# Patient Record
Sex: Female | Born: 1955 | Race: White | Hispanic: No | Smoking: Never smoker
Health system: Southern US, Community
[De-identification: ages and names within clinical notes are randomized; demographics above are authoritative.]

---

## 2020-01-14 ENCOUNTER — Inpatient Hospital Stay
Admission: EM | Admit: 2020-01-14 | Discharge: 2020-01-19 | DRG: 064 | Disposition: A | Discharge status: Rehabilitation Facility | Payer: Medicare (Managed Care) | Attending: Internal Medicine | Admitting: Internal Medicine

## 2020-01-14 ENCOUNTER — Emergency Department: Payer: Medicare (Managed Care)

## 2020-01-14 DIAGNOSIS — Z95828 Presence of other vascular implants and grafts: Secondary | ICD-10-CM

## 2020-01-14 DIAGNOSIS — Z7901 Long term (current) use of anticoagulants: Secondary | ICD-10-CM

## 2020-01-14 DIAGNOSIS — Z823 Family history of stroke: Secondary | ICD-10-CM

## 2020-01-14 DIAGNOSIS — R471 Dysarthria and anarthria: Secondary | ICD-10-CM | POA: Diagnosis present

## 2020-01-14 DIAGNOSIS — G459 Transient cerebral ischemic attack, unspecified: Secondary | ICD-10-CM

## 2020-01-14 DIAGNOSIS — F329 Major depressive disorder, single episode, unspecified: Secondary | ICD-10-CM | POA: Diagnosis present

## 2020-01-14 DIAGNOSIS — E876 Hypokalemia: Secondary | ICD-10-CM | POA: Diagnosis present

## 2020-01-14 DIAGNOSIS — R4701 Aphasia: Secondary | ICD-10-CM | POA: Diagnosis present

## 2020-01-14 DIAGNOSIS — Z86718 Personal history of other venous thrombosis and embolism: Secondary | ICD-10-CM

## 2020-01-14 DIAGNOSIS — Z8249 Family history of ischemic heart disease and other diseases of the circulatory system: Secondary | ICD-10-CM

## 2020-01-14 DIAGNOSIS — Z885 Allergy status to narcotic agent status: Secondary | ICD-10-CM

## 2020-01-14 DIAGNOSIS — R29702 NIHSS score 2: Secondary | ICD-10-CM | POA: Diagnosis present

## 2020-01-14 DIAGNOSIS — Z20822 Contact with and (suspected) exposure to covid-19: Secondary | ICD-10-CM | POA: Diagnosis present

## 2020-01-14 DIAGNOSIS — I639 Cerebral infarction, unspecified: Secondary | ICD-10-CM

## 2020-01-14 DIAGNOSIS — I69320 Aphasia following cerebral infarction: Secondary | ICD-10-CM | POA: Diagnosis not present

## 2020-01-14 DIAGNOSIS — I493 Ventricular premature depolarization: Secondary | ICD-10-CM | POA: Diagnosis present

## 2020-01-14 DIAGNOSIS — D649 Anemia, unspecified: Secondary | ICD-10-CM | POA: Diagnosis present

## 2020-01-14 DIAGNOSIS — R197 Diarrhea, unspecified: Secondary | ICD-10-CM | POA: Diagnosis present

## 2020-01-14 DIAGNOSIS — Z833 Family history of diabetes mellitus: Secondary | ICD-10-CM

## 2020-01-14 DIAGNOSIS — Z91041 Radiographic dye allergy status: Secondary | ICD-10-CM

## 2020-01-14 DIAGNOSIS — R4587 Impulsiveness: Secondary | ICD-10-CM | POA: Diagnosis present

## 2020-01-14 DIAGNOSIS — F419 Anxiety disorder, unspecified: Secondary | ICD-10-CM | POA: Diagnosis present

## 2020-01-14 DIAGNOSIS — I4891 Unspecified atrial fibrillation: Secondary | ICD-10-CM

## 2020-01-14 DIAGNOSIS — I6340 Cerebral infarction due to embolism of unspecified cerebral artery: Secondary | ICD-10-CM | POA: Diagnosis not present

## 2020-01-14 DIAGNOSIS — E43 Unspecified severe protein-calorie malnutrition: Secondary | ICD-10-CM | POA: Insufficient documentation

## 2020-01-14 DIAGNOSIS — R4702 Dysphasia: Secondary | ICD-10-CM | POA: Diagnosis present

## 2020-01-14 DIAGNOSIS — I081 Rheumatic disorders of both mitral and tricuspid valves: Secondary | ICD-10-CM | POA: Diagnosis present

## 2020-01-14 DIAGNOSIS — E785 Hyperlipidemia, unspecified: Secondary | ICD-10-CM | POA: Diagnosis present

## 2020-01-14 DIAGNOSIS — J449 Chronic obstructive pulmonary disease, unspecified: Secondary | ICD-10-CM | POA: Diagnosis present

## 2020-01-14 DIAGNOSIS — I482 Chronic atrial fibrillation, unspecified: Secondary | ICD-10-CM | POA: Diagnosis present

## 2020-01-14 DIAGNOSIS — Z981 Arthrodesis status: Secondary | ICD-10-CM

## 2020-01-14 DIAGNOSIS — E669 Obesity, unspecified: Secondary | ICD-10-CM | POA: Diagnosis present

## 2020-01-14 DIAGNOSIS — D689 Coagulation defect, unspecified: Secondary | ICD-10-CM | POA: Diagnosis present

## 2020-01-14 DIAGNOSIS — Z9049 Acquired absence of other specified parts of digestive tract: Secondary | ICD-10-CM

## 2020-01-14 DIAGNOSIS — Z79899 Other long term (current) drug therapy: Secondary | ICD-10-CM

## 2020-01-14 DIAGNOSIS — F17210 Nicotine dependence, cigarettes, uncomplicated: Secondary | ICD-10-CM | POA: Diagnosis present

## 2020-01-14 DIAGNOSIS — R29810 Facial weakness: Secondary | ICD-10-CM | POA: Diagnosis present

## 2020-01-14 DIAGNOSIS — I6522 Occlusion and stenosis of left carotid artery: Secondary | ICD-10-CM | POA: Diagnosis present

## 2020-01-14 DIAGNOSIS — E0590 Thyrotoxicosis, unspecified without thyrotoxic crisis or storm: Secondary | ICD-10-CM | POA: Diagnosis present

## 2020-01-14 DIAGNOSIS — R1310 Dysphagia, unspecified: Secondary | ICD-10-CM | POA: Diagnosis present

## 2020-01-14 LAB — GLUCOSE, CAPILLARY: Glucose-Capillary: 76 mg/dL (ref 70–99)

## 2020-01-14 LAB — DIFFERENTIAL
Abs Immature Granulocytes: 0.04 10*3/uL (ref 0.00–0.07)
Basophils Absolute: 0 10*3/uL (ref 0.0–0.1)
Basophils Relative: 0 %
Eosinophils Absolute: 0.1 10*3/uL (ref 0.0–0.5)
Eosinophils Relative: 2 %
Immature Granulocytes: 0 %
Lymphocytes Relative: 11 %
Lymphs Abs: 1 10*3/uL (ref 0.7–4.0)
Monocytes Absolute: 0.6 10*3/uL (ref 0.1–1.0)
Monocytes Relative: 7 %
Neutro Abs: 7.5 10*3/uL (ref 1.7–7.7)
Neutrophils Relative %: 80 %

## 2020-01-14 LAB — CBC
HCT: 35.6 % — ABNORMAL LOW (ref 36.0–46.0)
Hemoglobin: 11.4 g/dL — ABNORMAL LOW (ref 12.0–15.0)
MCH: 24.9 pg — ABNORMAL LOW (ref 26.0–34.0)
MCHC: 32 g/dL (ref 30.0–36.0)
MCV: 77.7 fL — ABNORMAL LOW (ref 80.0–100.0)
Platelets: 231 10*3/uL (ref 150–400)
RBC: 4.58 MIL/uL (ref 3.87–5.11)
RDW: 16.5 % — ABNORMAL HIGH (ref 11.5–15.5)
WBC: 9.3 10*3/uL (ref 4.0–10.5)
nRBC: 0 % (ref 0.0–0.2)
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
## 2020-01-14 NOTE — ED Triage Notes (Addendum)
Pt to ED via EMS for aphasia. Hx of ischemic strokes, slight residual weakness per EMS stated by family. Pt alert but lethargic. Unable to identify name upon arrival, p 65m pt able to identify name. Disoriented to situation, trying to place clothes back on. Provider assessed at bedside. Breathing regular and unlabored. LKN at 2210

## 2020-01-15 ENCOUNTER — Inpatient Hospital Stay (HOSPITAL_COMMUNITY)
Admit: 2020-01-15 | Discharge: 2020-01-15 | Disposition: A | Discharge status: Home / Self Care | Payer: Medicare (Managed Care) | Attending: Family Medicine | Admitting: Family Medicine

## 2020-01-15 ENCOUNTER — Other Ambulatory Visit: Payer: Self-pay

## 2020-01-15 ENCOUNTER — Observation Stay: Payer: Medicare (Managed Care)

## 2020-01-15 DIAGNOSIS — I69320 Aphasia following cerebral infarction: Secondary | ICD-10-CM | POA: Diagnosis present

## 2020-01-15 DIAGNOSIS — E876 Hypokalemia: Secondary | ICD-10-CM | POA: Diagnosis present

## 2020-01-15 DIAGNOSIS — I6340 Cerebral infarction due to embolism of unspecified cerebral artery: Secondary | ICD-10-CM | POA: Diagnosis present

## 2020-01-15 DIAGNOSIS — E785 Hyperlipidemia, unspecified: Secondary | ICD-10-CM

## 2020-01-15 DIAGNOSIS — I4819 Other persistent atrial fibrillation: Secondary | ICD-10-CM | POA: Diagnosis not present

## 2020-01-15 DIAGNOSIS — I4891 Unspecified atrial fibrillation: Secondary | ICD-10-CM | POA: Diagnosis not present

## 2020-01-15 DIAGNOSIS — F329 Major depressive disorder, single episode, unspecified: Secondary | ICD-10-CM | POA: Diagnosis present

## 2020-01-15 DIAGNOSIS — J449 Chronic obstructive pulmonary disease, unspecified: Secondary | ICD-10-CM | POA: Diagnosis present

## 2020-01-15 DIAGNOSIS — Z20822 Contact with and (suspected) exposure to covid-19: Secondary | ICD-10-CM | POA: Diagnosis present

## 2020-01-15 DIAGNOSIS — Z9049 Acquired absence of other specified parts of digestive tract: Secondary | ICD-10-CM | POA: Diagnosis not present

## 2020-01-15 DIAGNOSIS — I340 Nonrheumatic mitral (valve) insufficiency: Secondary | ICD-10-CM | POA: Diagnosis not present

## 2020-01-15 DIAGNOSIS — I639 Cerebral infarction, unspecified: Secondary | ICD-10-CM | POA: Diagnosis not present

## 2020-01-15 DIAGNOSIS — Z981 Arthrodesis status: Secondary | ICD-10-CM | POA: Diagnosis not present

## 2020-01-15 DIAGNOSIS — R4587 Impulsiveness: Secondary | ICD-10-CM | POA: Diagnosis present

## 2020-01-15 DIAGNOSIS — R29810 Facial weakness: Secondary | ICD-10-CM | POA: Diagnosis present

## 2020-01-15 DIAGNOSIS — D7218 Eosinophilia in diseases classified elsewhere: Secondary | ICD-10-CM | POA: Diagnosis not present

## 2020-01-15 DIAGNOSIS — I493 Ventricular premature depolarization: Secondary | ICD-10-CM | POA: Diagnosis present

## 2020-01-15 DIAGNOSIS — G894 Chronic pain syndrome: Secondary | ICD-10-CM | POA: Diagnosis not present

## 2020-01-15 DIAGNOSIS — I361 Nonrheumatic tricuspid (valve) insufficiency: Secondary | ICD-10-CM

## 2020-01-15 DIAGNOSIS — E0590 Thyrotoxicosis, unspecified without thyrotoxic crisis or storm: Secondary | ICD-10-CM | POA: Diagnosis present

## 2020-01-15 DIAGNOSIS — D72823 Leukemoid reaction: Secondary | ICD-10-CM | POA: Diagnosis not present

## 2020-01-15 DIAGNOSIS — R4702 Dysphasia: Secondary | ICD-10-CM | POA: Diagnosis present

## 2020-01-15 DIAGNOSIS — D649 Anemia, unspecified: Secondary | ICD-10-CM | POA: Diagnosis present

## 2020-01-15 DIAGNOSIS — I482 Chronic atrial fibrillation, unspecified: Secondary | ICD-10-CM | POA: Diagnosis present

## 2020-01-15 DIAGNOSIS — R1310 Dysphagia, unspecified: Secondary | ICD-10-CM | POA: Diagnosis present

## 2020-01-15 DIAGNOSIS — D689 Coagulation defect, unspecified: Secondary | ICD-10-CM | POA: Diagnosis present

## 2020-01-15 DIAGNOSIS — F419 Anxiety disorder, unspecified: Secondary | ICD-10-CM | POA: Diagnosis present

## 2020-01-15 DIAGNOSIS — D72829 Elevated white blood cell count, unspecified: Secondary | ICD-10-CM | POA: Diagnosis not present

## 2020-01-15 DIAGNOSIS — E43 Unspecified severe protein-calorie malnutrition: Secondary | ICD-10-CM | POA: Diagnosis present

## 2020-01-15 DIAGNOSIS — R4701 Aphasia: Secondary | ICD-10-CM | POA: Diagnosis not present

## 2020-01-15 DIAGNOSIS — R197 Diarrhea, unspecified: Secondary | ICD-10-CM | POA: Diagnosis present

## 2020-01-15 DIAGNOSIS — Z86718 Personal history of other venous thrombosis and embolism: Secondary | ICD-10-CM | POA: Diagnosis not present

## 2020-01-15 DIAGNOSIS — E669 Obesity, unspecified: Secondary | ICD-10-CM | POA: Diagnosis present

## 2020-01-15 DIAGNOSIS — Z7901 Long term (current) use of anticoagulants: Secondary | ICD-10-CM | POA: Diagnosis not present

## 2020-01-15 DIAGNOSIS — R1114 Bilious vomiting: Secondary | ICD-10-CM | POA: Diagnosis not present

## 2020-01-15 LAB — CBC
HCT: 34.3 % — ABNORMAL LOW (ref 36.0–46.0)
Hemoglobin: 10.9 g/dL — ABNORMAL LOW (ref 12.0–15.0)
MCH: 24.7 pg — ABNORMAL LOW (ref 26.0–34.0)
MCHC: 31.8 g/dL (ref 30.0–36.0)
MCV: 77.6 fL — ABNORMAL LOW (ref 80.0–100.0)
Platelets: 206 10*3/uL (ref 150–400)
RBC: 4.42 MIL/uL (ref 3.87–5.11)
RDW: 16.5 % — ABNORMAL HIGH (ref 11.5–15.5)
WBC: 9.5 10*3/uL (ref 4.0–10.5)
nRBC: 0 % (ref 0.0–0.2)

## 2020-01-15 LAB — COMPREHENSIVE METABOLIC PANEL
ALT: 13 U/L (ref 0–44)
ALT: 11 U/L (ref 0–44)
AST: 17 U/L (ref 15–41)
AST: 14 U/L — ABNORMAL LOW (ref 15–41)
Albumin: 3.8 g/dL (ref 3.5–5.0)
Albumin: 3.3 g/dL — ABNORMAL LOW (ref 3.5–5.0)
Alkaline Phosphatase: 65 U/L (ref 38–126)
Alkaline Phosphatase: 58 U/L (ref 38–126)
Anion gap: 11 (ref 5–15)
Anion gap: 10 (ref 5–15)
BUN: 17 mg/dL (ref 8–23)
BUN: 15 mg/dL (ref 8–23)
CO2: 28 mmol/L (ref 22–32)
CO2: 26 mmol/L (ref 22–32)
Calcium: 9.1 mg/dL (ref 8.9–10.3)
Calcium: 8.5 mg/dL — ABNORMAL LOW (ref 8.9–10.3)
Chloride: 103 mmol/L (ref 98–111)
Chloride: 106 mmol/L (ref 98–111)
Creatinine, Ser: 0.88 mg/dL (ref 0.44–1.00)
Creatinine, Ser: 0.64 mg/dL (ref 0.44–1.00)
GFR calc Af Amer: >60 mL/min (ref >60)
GFR calc Af Amer: >60 mL/min (ref >60)
GFR calc non Af Amer: >60 mL/min (ref >60)
GFR calc non Af Amer: >60 mL/min (ref >60)
Glucose, Bld: 99 mg/dL (ref 70–99)
Glucose, Bld: 95 mg/dL (ref 70–99)
Potassium: 3.4 mmol/L — ABNORMAL LOW (ref 3.5–5.1)
Potassium: 3.4 mmol/L — ABNORMAL LOW (ref 3.5–5.1)
Sodium: 142 mmol/L (ref 135–145)
Sodium: 142 mmol/L (ref 135–145)
Total Bilirubin: 1 mg/dL (ref 0.3–1.2)
Total Bilirubin: 0.9 mg/dL (ref 0.3–1.2)
Total Protein: 6.7 g/dL (ref 6.5–8.1)
Total Protein: 6.1 g/dL — ABNORMAL LOW (ref 6.5–8.1)

## 2020-01-15 LAB — LIPID PANEL
Cholesterol: 117 mg/dL (ref 0–200)
HDL: 37 mg/dL — ABNORMAL LOW (ref >40)
LDL Cholesterol: 71 mg/dL (ref 0–99)
Total CHOL/HDL Ratio: 3.2 RATIO
Triglycerides: 47 mg/dL (ref <150)
VLDL: 9 mg/dL (ref 0–40)

## 2020-01-15 LAB — ETHANOL: Alcohol, Ethyl (B): <10 mg/dL (ref <10)

## 2020-01-15 LAB — SARS CORONAVIRUS 2 BY RT PCR (HOSPITAL ORDER, PERFORMED IN ~~LOC~~ HOSPITAL LAB): SARS Coronavirus 2: NEGATIVE

## 2020-01-15 LAB — PROTIME-INR
INR: 1.4 — ABNORMAL HIGH (ref 0.8–1.2)
INR: 2.1 — ABNORMAL HIGH (ref 0.8–1.2)
Prothrombin Time: 16.6 seconds — ABNORMAL HIGH (ref 11.4–15.2)
Prothrombin Time: 22.6 seconds — ABNORMAL HIGH (ref 11.4–15.2)

## 2020-01-15 LAB — HEMOGLOBIN A1C
Hgb A1c MFr Bld: 5.8 % — ABNORMAL HIGH (ref 4.8–5.6)
Mean Plasma Glucose: 119.76 mg/dL

## 2020-01-15 LAB — HIV ANTIBODY (ROUTINE TESTING W REFLEX): HIV Screen 4th Generation wRfx: NONREACTIVE

## 2020-01-15 LAB — APTT: aPTT: 31 seconds (ref 24–36)

## 2020-01-15 MED ORDER — DILTIAZEM HCL 25 MG/5ML IV SOLN
5.0000 mg | Freq: Once | INTRAVENOUS | Status: AC
  Administered 2020-01-15: 5 mg via INTRAVENOUS
  Filled 2020-01-15: qty 5

## 2020-01-15 MED ORDER — ACETAMINOPHEN 650 MG RE SUPP: 650.0000 mg | RECTAL | Status: DC | PRN

## 2020-01-15 MED ORDER — ATORVASTATIN CALCIUM 20 MG PO TABS
40.0000 mg | ORAL_TABLET | Freq: Every day | ORAL | Status: DC
  Administered 2020-01-16 – 2020-01-19 (×4): 40 mg via ORAL
  Filled 2020-01-15 (×4): qty 2

## 2020-01-15 MED ORDER — POTASSIUM CHLORIDE CRYS ER 20 MEQ PO TBCR
40.0000 meq | EXTENDED_RELEASE_TABLET | Freq: Once | ORAL | Status: AC
  Administered 2020-01-15: 40 meq via ORAL
  Filled 2020-01-15: qty 2

## 2020-01-15 MED ORDER — METOPROLOL SUCCINATE ER 50 MG PO TB24
50.0000 mg | ORAL_TABLET | Freq: Every day | ORAL | Status: DC
  Administered 2020-01-15: 50 mg via ORAL
  Filled 2020-01-15: qty 1

## 2020-01-15 MED ORDER — SENNOSIDES-DOCUSATE SODIUM 8.6-50 MG PO TABS: 1.0000 | ORAL_TABLET | Freq: Every evening | ORAL | Status: DC | PRN

## 2020-01-15 MED ORDER — LOPERAMIDE HCL 2 MG PO CAPS
2.0000 mg | ORAL_CAPSULE | ORAL | Status: DC | PRN
  Administered 2020-01-16 – 2020-01-17 (×2): 2 mg via ORAL
  Filled 2020-01-15 (×3): qty 1

## 2020-01-15 MED ORDER — STROKE: EARLY STAGES OF RECOVERY BOOK: Freq: Once | Status: DC

## 2020-01-15 MED ORDER — ALPRAZOLAM 0.25 MG PO TABS
0.2500 mg | ORAL_TABLET | Freq: Two times a day (BID) | ORAL | Status: DC | PRN
  Administered 2020-01-16 – 2020-01-18 (×3): 0.25 mg via ORAL
  Filled 2020-01-15 (×3): qty 1

## 2020-01-15 MED ORDER — MAGNESIUM SULFATE 2 GM/50ML IV SOLN
2.0000 g | Freq: Once | INTRAVENOUS | Status: AC
  Administered 2020-01-15: 2 g via INTRAVENOUS
  Filled 2020-01-15: qty 50

## 2020-01-15 MED ORDER — ACETAMINOPHEN 160 MG/5ML PO SOLN
650.0000 mg | ORAL | Status: DC | PRN
  Filled 2020-01-15: qty 20.3

## 2020-01-15 MED ORDER — OXYCODONE HCL ER 15 MG PO T12A: 40.0000 mg | EXTENDED_RELEASE_TABLET | Freq: Three times a day (TID) | ORAL | Status: DC | PRN

## 2020-01-15 MED ORDER — DILTIAZEM HCL ER COATED BEADS 180 MG PO CP24
180.0000 mg | ORAL_CAPSULE | Freq: Once | ORAL | Status: AC
  Administered 2020-01-15: 180 mg via ORAL
  Filled 2020-01-15: qty 1

## 2020-01-15 MED ORDER — METOPROLOL SUCCINATE ER 50 MG PO TB24
100.0000 mg | ORAL_TABLET | Freq: Every day | ORAL | Status: DC
  Administered 2020-01-16 – 2020-01-19 (×4): 100 mg via ORAL
  Filled 2020-01-15: qty 1
  Filled 2020-01-15 (×2): qty 2
  Filled 2020-01-15: qty 1

## 2020-01-15 MED ORDER — METOPROLOL TARTRATE 5 MG/5ML IV SOLN: 5.0000 mg | Freq: Four times a day (QID) | INTRAVENOUS | Status: DC | PRN

## 2020-01-15 MED ORDER — APIXABAN 5 MG PO TABS
5.0000 mg | ORAL_TABLET | Freq: Two times a day (BID) | ORAL | Status: DC
  Administered 2020-01-15 – 2020-01-17 (×5): 5 mg via ORAL
  Filled 2020-01-15 (×5): qty 1

## 2020-01-15 MED ORDER — SODIUM CHLORIDE 0.9 % IV SOLN: INTRAVENOUS | Status: DC

## 2020-01-15 MED ORDER — RIVAROXABAN 20 MG PO TABS
20.0000 mg | ORAL_TABLET | Freq: Once | ORAL | Status: AC
  Administered 2020-01-15: 20 mg via ORAL
  Filled 2020-01-15: qty 1

## 2020-01-15 MED ORDER — SODIUM CHLORIDE 0.9% FLUSH
10.0000 mL | Freq: Two times a day (BID) | INTRAVENOUS | Status: DC
  Administered 2020-01-15 – 2020-01-19 (×8): 10 mL via INTRAVENOUS

## 2020-01-15 MED ORDER — ASPIRIN EC 81 MG PO TBEC
81.0000 mg | DELAYED_RELEASE_TABLET | Freq: Every day | ORAL | Status: DC
  Administered 2020-01-15 – 2020-01-16 (×2): 81 mg via ORAL
  Filled 2020-01-15 (×2): qty 1

## 2020-01-15 MED ORDER — ACETAMINOPHEN 325 MG PO TABS: 650.0000 mg | ORAL_TABLET | ORAL | Status: DC | PRN

## 2020-01-15 MED ORDER — METOPROLOL TARTRATE 5 MG/5ML IV SOLN
5.0000 mg | Freq: Once | INTRAVENOUS | Status: AC
  Administered 2020-01-15: 5 mg via INTRAVENOUS
  Filled 2020-01-15: qty 5

## 2020-01-15 MED ORDER — DILTIAZEM HCL ER COATED BEADS 180 MG PO CP24
180.0000 mg | ORAL_CAPSULE | Freq: Every day | ORAL | Status: DC
  Administered 2020-01-15 – 2020-01-16 (×2): 180 mg via ORAL
  Filled 2020-01-15 (×2): qty 1

## 2020-01-15 MED ORDER — ATORVASTATIN CALCIUM 20 MG PO TABS
20.0000 mg | ORAL_TABLET | Freq: Every day | ORAL | Status: DC
  Administered 2020-01-15: 20 mg via ORAL
  Filled 2020-01-15 (×2): qty 1
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
## 2020-01-15 NOTE — Progress Notes (Signed)
*  PRELIMINARY RESULTS* Echocardiogram 2D Echocardiogram has been performed.  Julie Harmon 01/15/2020, 6:50 PM

## 2020-01-15 NOTE — ED Notes (Signed)
Report to jennifer, rn.  
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
## 2020-01-15 NOTE — ED Notes (Addendum)
Pt at MRI, per daughter the pt's neurologist and cardiologist reported xarelto is ineffective for pt and should not be taken. Dr. Don Perking made aware via secure private chat. Pt speech has improved but remains slurred. Able to state name but not birthday. Oriented to place, disoriented to time and situation.

## 2020-01-15 NOTE — ED Notes (Signed)
Pt cleansed of small amount of semi formed stool. Pt states in broken sentence after some time "I want to go home" and "what day is it".

## 2020-01-15 NOTE — ED Notes (Signed)
Spoke with Webb Silversmith, np via secure chat regarding results of MRI and pt's cardiac rhythm a fib with rate.

## 2020-01-15 NOTE — Consult Note (Signed)
[facility] TeleNeurology Consult Services   Date of Service:   01/14/2020 23:26:30  Impression:     .  R47.81 - Slurred speech  Comments/Sign-Out: Pt with a h/o prior stroke and intermittent naming issues presents with an episode nausea/vomiting and slurred speech. No aphasia, mild dysarthria and mild confusion on examination. On xarelto reportedly for afib. PT = 16.6 therefore not alteplase candidate. Recommend medical workup for infectious/toxic/metabolic issue, continue xarelto and obtain MRI brain, MRA head and neck to evaluate for acute infarct.  Metrics: Last Known Well: 01/13/2020 22:10:00 [facility] Notification Time: 01/14/2020 23:25:57 Arrival Time: 01/14/2020 23:17:00 Stamp Time: 01/14/2020 23:26:30 Time First Login Attempt: 01/14/2020 23:28:13 Symptoms: speech changes. NIHSS Start Assessment Time: 01/14/2020 23:45:47 Patient is not a candidate for Thrombolytic. Thrombolytic Medical Decision: 01/14/2020 23:42:49 Patient was not deemed candidate for Thrombolytic because of following reasons: Use of NOAs within 48 hours. Coagulopathy.  CT head showed no acute hemorrhage or acute core infarct. CT head was reviewed.  ED Physician notified of diagnostic impression and management plan on 01/15/2020 00:00:35  Advanced Imaging: Advanced Imaging Not Recommended because:  Clinical Presentation is not Suggestive of LVO and NIHSS is <6   Our recommendations are outlined below.  Recommendations:     .  Activate Stroke Protocol Admission/Order Set     .  Stroke/Telemetry Floor     .  Neuro Checks     .  Bedside Swallow Eval     .  DVT Prophylaxis     .  IV Fluids, Normal Saline     .  Head of Bed 30 Degrees     .  Euglycemia and Avoid Hyperthermia (PRN Acetaminophen)     .  Recommend medical workup for infectious/toxic/metabolic issue, continue xarelto and obtain MRI brain, MRA head and neck to evaluate for acute infarct.  Routine Consultation  with Inhouse Neurology for Follow up Care  Sign Out:     .  Discussed with Emergency Department Provider    ------------------------------------------------------------------------------  History of Present Illness: Patient is a 64 year old Female.  Patient was brought by EMS for symptoms of speech changes.  The patient has a prior history of stroke and is visiting family from out of town. She was noted to have an episode of nausea, vomiting, diarrhea at 1010 p.m. She has a h/o intermittent difficulty with naming items since her prior stroke/ speech changes. She is on xarelto for afib (though reported she may have not taken today, unclear when she last took it.   Past Medical History:     . Stroke  Anticoagulant use:  xarelto     Examination: BP(129/55), NGEXB(284), Blood Glucose(76) 1A: Level of Consciousness - Alert; keenly responsive + 0 1B: Ask Month and Age - 1 Question Right + 1 1C: Blink Eyes & Squeeze Hands - Performs Both Tasks + 0 2: Test Horizontal Extraocular Movements - Normal + 0 3: Test Visual Fields - No Visual Loss + 0 4: Test Facial Palsy (Use Grimace if Obtunded) - Normal symmetry + 0 5A: Test Left Arm Motor Drift - No Drift for 10 Seconds + 0 5B: Test Right Arm Motor Drift - No Drift for 10 Seconds + 0 6A: Test Left Leg Motor Drift - No Drift for 5 Seconds + 0 6B: Test Right Leg Motor Drift - No Drift for 5 Seconds + 0 7: Test Limb Ataxia (FNF/Heel-Shin) - No Ataxia + 0 8: Test Sensation - Normal; No sensory loss + 0 9: Test  Language/Aphasia - Normal; No aphasia + 0 10: Test Dysarthria - Mild-Moderate Dysarthria: Slurring but can be understood + 1 11: Test Extinction/Inattention - No abnormality + 0  NIHSS Score: 2  Pre-Morbid Modified Rankin Scale: 2 Points = Slight disability; unable to carry out all previous activities, but able to look after own affairs without assistance   Patient/Family was informed the Neurology Consult would occur via  TeleHealth consult by way of interactive audio and video telecommunications and consented to receiving care in this manner.   Patient is being evaluated for possible acute neurologic impairment and high probability of imminent or life-threatening deterioration. I spent total of 23 minutes providing care to this patient, including time for face to face visit via telemedicine, review of medical records, imaging studies and discussion of findings with providers, the patient and/or family.   Dr Rutherford Guys   [facility] [phone]  Case [id number]

## 2020-01-15 NOTE — Progress Notes (Signed)
Patient and family requesting that her [place] of [place] Neurologist be contacted Dr. [place] Livings number [phone] if additional info required. I am not able to verify this information therefore cannot call the listed Physician to discuss patient's pertinent health information.    Webb Silversmith, BSN, MSN, DNP, [employer]  [facility] Nurse Practitioner  [facility]

## 2020-01-15 NOTE — Progress Notes (Signed)
Chaplain responded to a CODE STROKE page. Chaplain consulted with MD and RN.   Chaplain offered emotional-spiritual care to Patient and Sister. Patient declined at the time of the visit.  Chaplain will remain available by way of page and spiritual consult.  Clenton Pare, MDiv Staff Chaplain-Relief

## 2020-01-15 NOTE — ED Notes (Signed)
Hx of a-fib. Tele-stroke used, admitting physician at bedside
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
## 2020-01-15 NOTE — ED Notes (Signed)
Sister at bedside, states last stroke in June 2021 with clot removal. Pt had episode of NVD at 2210 and aphasia started. Pt and sister state pt was not feeling well all day. Recently drove to [facility] from [place].

## 2020-01-15 NOTE — ED Notes (Signed)
Per melanie, ed tech, pt's sister has called and asked that pt's personal MD be consulted regarding pt's care at primary MD request. Phone number and name provided to [facility], np.

## 2020-01-15 NOTE — Plan of Care (Signed)
  Problem: Education: Goal: Knowledge of disease or condition will improve Outcome: Progressing Goal: Knowledge of secondary prevention will improve Outcome: Progressing Goal: Knowledge of patient specific risk factors addressed and post discharge goals established will improve Outcome: Progressing   Problem: Education: Goal: Knowledge of General Education information will improve Description: Including pain rating scale, medication(s)/side effects and non-pharmacologic comfort measures Outcome: Progressing   Problem: Health Behavior/Discharge Planning: Goal: Ability to manage health-related needs will improve Outcome: Progressing

## 2020-01-15 NOTE — ED Notes (Signed)
Pt does have a delay in following commands but is able to do so appropriately if given extended time to complete command. No diarrhea noted at this time.

## 2020-01-15 NOTE — ED Notes (Signed)
Report given to April, RN. Sister remains at bedside
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
## 2020-01-15 NOTE — ED Notes (Signed)
Posey alarm in place. Fall pads around bed.

## 2020-01-15 NOTE — ED Notes (Signed)
Pt transported to CT on monitor by RN, tolerated well.

## 2020-01-16 ENCOUNTER — Inpatient Hospital Stay: Payer: Medicare (Managed Care)

## 2020-01-16 DIAGNOSIS — R4701 Aphasia: Secondary | ICD-10-CM

## 2020-01-16 LAB — COMPREHENSIVE METABOLIC PANEL
ALT: 12 U/L (ref 0–44)
AST: 18 U/L (ref 15–41)
Albumin: 3.7 g/dL (ref 3.5–5.0)
Alkaline Phosphatase: 61 U/L (ref 38–126)
Anion gap: 14 (ref 5–15)
BUN: 12 mg/dL (ref 8–23)
CO2: 22 mmol/L (ref 22–32)
Calcium: 8.9 mg/dL (ref 8.9–10.3)
Chloride: 103 mmol/L (ref 98–111)
Creatinine, Ser: 0.53 mg/dL (ref 0.44–1.00)
GFR calc Af Amer: >60 mL/min (ref >60)
GFR calc non Af Amer: >60 mL/min (ref >60)
Glucose, Bld: 96 mg/dL (ref 70–99)
Potassium: 3.7 mmol/L (ref 3.5–5.1)
Sodium: 139 mmol/L (ref 135–145)
Total Bilirubin: 1.2 mg/dL (ref 0.3–1.2)
Total Protein: 6.7 g/dL (ref 6.5–8.1)

## 2020-01-16 LAB — CBC
HCT: 38 % (ref 36.0–46.0)
Hemoglobin: 12.3 g/dL (ref 12.0–15.0)
MCH: 25.1 pg — ABNORMAL LOW (ref 26.0–34.0)
MCHC: 32.4 g/dL (ref 30.0–36.0)
MCV: 77.4 fL — ABNORMAL LOW (ref 80.0–100.0)
Platelets: 235 10*3/uL (ref 150–400)
RBC: 4.91 MIL/uL (ref 3.87–5.11)
RDW: 17.1 % — ABNORMAL HIGH (ref 11.5–15.5)
WBC: 12.9 10*3/uL — ABNORMAL HIGH (ref 4.0–10.5)
nRBC: 0 % (ref 0.0–0.2)

## 2020-01-16 LAB — ECHOCARDIOGRAM COMPLETE
AR max vel: 1.34 cm2
AV Peak grad: 7.6 mmHg
Ao pk vel: 1.38 m/s
Area-P 1/2: 3.23 cm2
S' Lateral: 2.84 cm
Weight: 1992 oz

## 2020-01-16 LAB — PROTIME-INR
INR: 1.8 — ABNORMAL HIGH (ref 0.8–1.2)
Prothrombin Time: 20 seconds — ABNORMAL HIGH (ref 11.4–15.2)

## 2020-01-16 MED ORDER — DILTIAZEM HCL ER COATED BEADS 300 MG PO CP24
300.0000 mg | ORAL_CAPSULE | Freq: Every day | ORAL | Status: DC
  Administered 2020-01-17 – 2020-01-19 (×3): 300 mg via ORAL
  Filled 2020-01-16 (×3): qty 1

## 2020-01-16 MED ORDER — ACETAMINOPHEN 325 MG PO TABS
650.0000 mg | ORAL_TABLET | ORAL | Status: DC | PRN
  Administered 2020-01-19: 650 mg via ORAL
  Filled 2020-01-16: qty 2

## 2020-01-16 MED ORDER — DILTIAZEM HCL 30 MG PO TABS
60.0000 mg | ORAL_TABLET | Freq: Once | ORAL | Status: AC
  Administered 2020-01-16: 60 mg via ORAL
  Filled 2020-01-16: qty 2

## 2020-01-16 MED ORDER — ACETAMINOPHEN 650 MG RE SUPP: 650.0000 mg | RECTAL | Status: DC | PRN

## 2020-01-16 MED ORDER — ACETAMINOPHEN 160 MG/5ML PO SOLN
650.0000 mg | ORAL | Status: DC | PRN
  Filled 2020-01-16: qty 20.3
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
## 2020-01-16 NOTE — TOC Progression Note (Signed)
Transition of Care [facility]) - Progression Note    Patient Details  Name: Julie Harmon MRN: [id number] Date of Birth: [birth year]  [facility] CM/SW Contact  Shawn Route, RN Phone Number: 01/16/2020, 10:20 AM  Clinical Narrative:    Patient to discharge home with family will need [facility] services when she returns to [place], which is in the next week.  I have attempted to reach primary care physician and left a message, but also instructed patient to contact her PCP when she gets home so this can be set up.         Expected Discharge Plan and Services                                                 Social Determinants of Health (SDOH) Interventions    Readmission Risk Interventions No flowsheet data found.

## 2020-01-16 NOTE — Progress Notes (Signed)
PROGRESS NOTE    Julie Harmon  HWT:[id number] DOB: 08-Jul-1955 DOA: 01/14/2020 PCP: System, Pcp Not In      Brief Narrative:  Julie Harmon is a 64 y.o. F with obesity, Afib on Xarelto, hx CVA, COPD, depression, hyperthyroidism, recurrent DVTs s/p IVC filter who presented with acute aphasia.  Patient is visiting from out of town.  She felt malaise all day, then in the evening had an episode of nausea and vomiting, and afterwards could not speak, brought immediately to the ER.    In the ER, CT head unremarkable.  Evaluated by Neurology and was not candidate for tPA due to being on Xarelto.  Subsequent MRI showed left frontal operculum/insula stroke.         Assessment & Plan:  Acute embolic STROKE Admission MRI showed small LEFT embolic infarct. -Non-invasive angiography showed no atherosclerosis on MRA -Echocardiogram showed EF 50-55% moderate tricuspid regurg -Carotid imaging no stenosis -Lipids ordered: on statin -Aspirin ordered at admission: Transition to Eliquis -Atrial fibrillation: Chronic, on Eliquis -tPA not given because NIH SS only 2, also on blood thinner -Dysphagia screen ordered in ER -PT eval ordered     Chronic atrial fibrillation HR near goal at rest, but still elevated, and up to 150s with exertion despite increasing metoprolol dose.  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    The patient was admitted with stroke.  She is normally independent, able to  care for self, but at present is unstable to stand alone, and severely aphasic, impulsive and unable to care for self.  Will titrate rate control again.  If HR < 110 at rest tomorrow,  will discharge to CIR when bed available.          MDM: The below labs and imaging reports reviewed and summarized above.  Medication management as above.    DVT prophylaxis: SCD's Start: 01/15/20 0048 apixaban (ELIQUIS) tablet 5 mg  Code Status: FULL Family Communication: none today    Consultants:   Neurology         Subjective: No headache.  Aphasia not better.  Very unsteady.  No fever, cough, chest pain.  No dyspnea, palpitations         Objective: Vitals:   01/16/20 0751 01/16/20 1301 01/16/20 1631 01/16/20 1958  BP: (!) 144/82 117/76 120/87 (!) 134/95  Pulse: (!) 119 (!) 116 (!) 103 (!) 123  Resp: 18 19 20 19   Temp: 98.5 F (36.9 C) 97.7 F (36.5 C) 98.5 F (36.9 C)   TempSrc: Oral Oral Oral   SpO2: 95% 99% 99% 97%  Weight:      Height:        Intake/Output Summary (Last 24 hours) at 01/16/2020 2057 Last data filed at 01/16/2020 1850 Gross per 24 hour  Intake 240 ml  Output  1 ml  Net 239 ml   Filed Weights   01/14/20 2334 01/15/20 1732  Weight: 56.5 kg 59.9 kg    Examination: BP (!) 134/95 (BP Location: Left Arm)   Pulse (!) 123   Temp 98.5 F (36.9 C) (Oral)   Resp 19   Ht 5\' 6"  (1.676 m)   Wt 59.9 kg   SpO2 97%   BMI 21.31 kg/m   General: Healthy, alert and in no distress.  Responds slowly and haltingly to some questions.   HEENT: Corneas clear, conjunctivae and sclerae normal without injection or icterus, lids and lashes normal.  Visual tracking smooth.  OP moist without erythema, exudates, cobblestoning, or ulcers.  No airway deformities.  Neck supple.   Cardiac: Tachycarid, irregular, nl S1-S2, no murmurs, rubs, gallops.  Capillary refill is less than 2 seconds.   Respiratory: Normal respiratory rate and rhythm.  CTAB without rales or  wheezes. Abdomen: BS present.  No TTP or rebound all quadrants.  No masses or organomegaly.  No scars.  No striae, dilated veins, rashes, or lesions.  No ascites, distension. Extremities: No deformities/injuries.  5/5 grip strength and upper extremity flexion/extension, symmetrically.  Extremities are warm and well-perfused. Neuro: Sensorium intact.  Speech is halting, very difficult with naming.Recall seems impaired  Muscle tone normal seems diminishedAttention span and concentration are within normal limits.  Psych:     Data Reviewed: I have personally reviewed following labs and imaging studies:  CBC: Recent Labs  Lab 01/14/20 2324 01/15/20 0227 01/16/20 0502  WBC 9.3 9.5 12.9*  NEUTROABS 7.5  --   --   HGB 11.4* 10.9* 12.3  HCT 35.6* 34.3* 38.0  MCV 77.7* 77.6* 77.4*  PLT 231 206 235   Basic Metabolic Panel: Recent Labs  Lab 01/14/20 2324 01/15/20 0227 01/16/20 0502  NA 142 142 139  K 3.4* 3.4* 3.7  CL 103 106 103  CO2 28 26 22   GLUCOSE 99 95 96  BUN 17 15 12   CREATININE 0.88 0.64 0.53  CALCIUM 9.1 8.5* 8.9   GFR: Estimated Creatinine Clearance: 66.5 mL/min (by C-G formula based on SCr of 0.53 mg/dL). Liver Function Tests: Recent Labs  Lab 01/14/20 2324 01/15/20 0227 01/16/20 0502  AST 17 14* 18  ALT 13 11 12   ALKPHOS 65 58 61  BILITOT 1.0 0.9 1.2  PROT 6.7 6.1* 6.7  ALBUMIN 3.8 3.3* 3.7   No results for input(s): LIPASE, AMYLASE in the last 168 hours. No results for input(s): AMMONIA in the last 168 hours. Coagulation Profile: Recent Labs  Lab 01/14/20 2324 01/15/20 1828 01/16/20 0502  INR 1.4* 2.1* 1.8*   Cardiac Enzymes: No results for input(s): CKTOTAL, CKMB, CKMBINDEX, TROPONINI in the last 168 hours. BNP (last 3 results) No results for input(s): PROBNP in the last 8760 hours. HbA1C: Recent Labs    01/15/20 0227  HGBA1C 5.8*   CBG: Recent Labs  Lab 01/14/20 2332  GLUCAP 76   Lipid Profile: Recent Labs    01/15/20 0227  CHOL  117  HDL 37*  LDLCALC 71  TRIG 47  CHOLHDL 3.2   Thyroid Function Tests: No results for input(s): TSH, T4TOTAL, FREET4, T3FREE, THYROIDAB in the last 72 hours. Anemia Panel: No results for input(s): VITAMINB12, FOLATE, FERRITIN, TIBC, IRON, RETICCTPCT in the last 72 hours. Urine analysis: No results found for: COLORURINE, APPEARANCEUR, LABSPEC, PHURINE, GLUCOSEU, HGBUR, BILIRUBINUR, KETONESUR, PROTEINUR, UROBILINOGEN, NITRITE, LEUKOCYTESUR Sepsis Labs: @LABRCNTIP (procalcitonin:4,lacticacidven:4)  ) Recent Results (from the past 240 hour(s))  SARS  Coronavirus 2 by RT PCR (hospital order, performed in [facility] hospital lab) Nasopharyngeal Nasopharyngeal Swab     Status: None   Collection Time: 01/15/20 12:34 AM   Specimen: Nasopharyngeal Swab  Result Value Ref Range Status   SARS Coronavirus 2 NEGATIVE NEGATIVE Final    Comment: (NOTE) SARS-CoV-2 target nucleic acids are NOT DETECTED.  The SARS-CoV-2 RNA is generally detectable in upper and lower respiratory specimens during the acute phase of infection. The lowest concentration of SARS-CoV-2 viral copies this assay can detect is 250 copies / mL. A negative result does not preclude SARS-CoV-2 infection and should not be used as the sole basis for treatment or other patient management decisions.  A negative result may occur with improper specimen collection / handling, submission of specimen other than nasopharyngeal swab, presence of viral mutation(s) within the areas targeted by this assay, and inadequate number of viral copies (<250 copies / mL). A negative result must be combined with clinical observations, patient history, and epidemiological information.  Fact Sheet for Patients:   BoilerBrush.com.cy  Fact Sheet for Healthcare Providers: https://pope.com/  This test is not yet approved or  cleared by the [place] FDA and has been authorized for detection and/or  diagnosis of SARS-CoV-2 by FDA under an Emergency Use Authorization (EUA).  This EUA will remain in effect (meaning this test can be used) for the duration of the COVID-19 declaration under Section 564(b)(1) of the Act, 21 U.S.C. section 360bbb-3(b)(1), unless the authorization is terminated or revoked sooner.  Performed at [facility] [address] [place], [place] [address]          Radiology Studies: MR ANGIO HEAD WO CONTRAST  Result Date: 01/16/2020 CLINICAL DATA:  64 year old female with code stroke presentation on 01/14/2020. MRI revealing small acute infarct of the left operculum, fairly extensive underlying chronic bilateral infarcts. EXAM: MRA HEAD WITHOUT CONTRAST TECHNIQUE: Angiographic images of the Circle of Willis were obtained using MRA technique without intravenous contrast. COMPARISON:  Brain MRI 01/15/2020. FINDINGS: Antegrade flow in the posterior circulation. Only the distal most vertebral arteries are included and appear normal at the vertebrobasilar junction. Patent basilar artery without stenosis. Normal SCA and left PCA origins. Fetal type right PCA origin. Right posterior communicating artery diminutive or absent. Bilateral PCA branches are within normal limits. Antegrade flow in both ICA siphons. Mild ectasia of the right siphon, measuring up to 6-7 mm diameter in the cavernous segment (series 5, image 55). No siphon stenosis, and no siphon saccular aneurysm. Ophthalmic and right posterior communicating artery origins appear normal. Patent carotid termini, MCA and ACA origins. Diminutive or absent anterior communicating artery. Visible ACA branches are within normal limits (allowing for mild motion artifact). Both MCAs bifurcate early, with mild ectasia of the right M1 segment. Visible right MCA branches are within normal limits. No discrete left MCA branch occlusion is identified on the source images. IMPRESSION: 1. Negative for large vessel occlusion. No  discrete Left MCA branch occlusion is identified. 2. No significant intracranial stenosis identified. There is mild dolichoectasia of the Right ICA siphon and the Right M1 segment. Electronically Signed   By: Odessa Fleming M.D.   On: 01/16/2020 12:40   MR BRAIN WO CONTRAST  Result Date: 01/15/2020 CLINICAL DATA:  Expressive aphasia EXAM: MRI HEAD WITHOUT CONTRAST TECHNIQUE: Multiplanar, multiecho pulse sequences of the brain and surrounding structures were obtained without intravenous contrast. COMPARISON:  Head CT 01/14/2020 FINDINGS: Brain: There is an area of faint hyperintensity on diffusion-weighted imaging  within the left frontal operculum and insula. No other diffusion abnormality. There is not a clear ADC correlate. 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 These include both frontal lobes, both parietal lobes, the left occipital lobe in the left cerebellum. Early confluent hyperintense T2-weighted signal of the periventricular and deep white matter. Advanced atrophy for age. No chronic microhemorrhage. Normal midline structures. Vascular: Normal flow voids. Skull and upper cervical spine: Normal marrow signal. Sinuses/Orbits: Negative. Other: None. IMPRESSION: 1. 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 No hemorrhage or mass effect. 2. 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 Electronically Signed   By: Deatra Robinson M.D.   On: 01/15/2020 01:53   US Carotid Bilateral  Result Date: 01/16/2020 CLINICAL DATA:  Acute cerebral infarction. History of hyperlipidemia and smoking. EXAM: BILATERAL CAROTID DUPLEX ULTRASOUND TECHNIQUE: Wallace Cullens scale imaging, color Doppler and duplex ultrasound were performed of bilateral carotid and vertebral arteries in the neck. COMPARISON:  None. FINDINGS: Criteria: Quantification of carotid stenosis is based on velocity parameters that correlate the residual internal carotid diameter with NASCET-based stenosis levels, using the  diameter of the distal internal carotid lumen as the denominator for stenosis measurement. The following velocity measurements were obtained: RIGHT ICA:  56/14 cm/sec CCA:  71/21 cm/sec SYSTOLIC ICA/CCA RATIO:  0.8 ECA:  62 cm/sec LEFT ICA:  45/12 cm/sec CCA:  60/17 cm/sec SYSTOLIC ICA/CCA RATIO:  0.7 ECA:  76 cm/sec RIGHT CAROTID ARTERY: Intimal thickening present without focal plaque or evidence of carotid stenosis. RIGHT VERTEBRAL ARTERY: Antegrade flow with normal waveform and velocity. LEFT CAROTID ARTERY: Minimal plaque at the level of the carotid bulb and proximal left ICA. No significant stenosis identified with estimated left ICA stenosis of less than 50%. LEFT VERTEBRAL ARTERY: Antegrade flow with normal waveform and velocity. IMPRESSION: Minimal plaque at the level of the left carotid bulb and proximal left ICA. Estimated left ICA stenosis is less than 50%. Electronically Signed   By: Irish Lack M.D.   On: 01/16/2020 09:16   ECHOCARDIOGRAM COMPLETE  Result Date: 01/16/2020    ECHOCARDIOGRAM REPORT   Patient Name:   Julie Harmon Date of Exam: 01/15/2020 Medical Rec #:  [id number]     Height: Accession #:    [id number]    Weight:       124.5 lb Date of Birth:  04-20-1956     BSA:          1.472 m Patient Age:    64 years      BP:           132/94 mmHg Patient Gender: F             HR:           72 bpm. Exam Location:  [facility] Procedure: 2D Echo, Cardiac Doppler and Color Doppler Indications:     TIA 435.9 / G45.9  History:         Patient has no prior history of Echocardiogram examinations.                  Stroke; Arrythmias:Atrial Fibrillation.  Sonographer:     Neysa Bonito Roar Referring Phys:  [id number] Vernetta Honey MANSY Diagnosing Phys: Yvonne Kendall MD IMPRESSIONS  1. Left ventricular ejection fraction, by estimation, is 50 to 55%. The left ventricle has low normal function. Left ventricular endocardial border not optimally defined to evaluate regional wall motion. Left ventricular diastolic parameters are  indeterminate.  2. Right ventricular systolic function is low  normal. The right ventricular size is normal. There is moderately elevated pulmonary artery systolic pressure.  3. Left atrial size was mildly dilated.  4. Right atrial size was mildly dilated.  5. The mitral valve is degenerative. Mild to moderate mitral valve regurgitation.  6. Tricuspid valve regurgitation is moderate.  7. Unable to determine valve morphology due to image quality. Aortic valve regurgitation is not visualized. No aortic stenosis is present.  8. The inferior vena cava is normal in size with <50% respiratory variability, suggesting right atrial pressure of 8 mmHg. FINDINGS  Left Ventricle: Left ventricular ejection fraction, by estimation, is 50 to 55%. The left ventricle has low normal function. Left ventricular endocardial border not optimally defined to evaluate regional wall motion. The left ventricular internal cavity  size was normal in size. There is no left ventricular hypertrophy. Left ventricular diastolic parameters are indeterminate. Right Ventricle: The right ventricular size is normal. No increase in right ventricular wall thickness. Right ventricular systolic function is low normal. There is moderately elevated pulmonary artery systolic pressure. The tricuspid regurgitant velocity  is 3.37 m/s, and with an assumed right atrial pressure of 8 mmHg, the estimated right ventricular systolic pressure is 53.4 mmHg. Left Atrium: Left atrial size was mildly dilated. Right Atrium: Right atrial size was mildly dilated. Pericardium: There is no evidence of pericardial effusion. Mitral Valve: The mitral valve is degenerative in appearance. There is mild thickening of the mitral valve leaflet(s). Mild to moderate mitral valve regurgitation. Tricuspid Valve: The tricuspid valve is not well visualized. Tricuspid valve regurgitation is moderate. Aortic Valve: Unable to determine valve morphology due to image quality. Aortic valve  regurgitation is not visualized. No aortic stenosis is present. Aortic valve peak gradient measures 7.6 mmHg. Pulmonic Valve: The pulmonic valve was not well visualized. Pulmonic valve regurgitation is trivial. No evidence of pulmonic stenosis. Aorta: The aortic root is normal in size and structure. Pulmonary Artery: The pulmonary artery is not well seen. Venous: The inferior vena cava is normal in size with less than 50% respiratory variability, suggesting right atrial pressure of 8 mmHg. IAS/Shunts: The interatrial septum was not well visualized.  LEFT VENTRICLE PLAX 2D LVIDd:         3.66 cm  Diastology LVIDs:         2.84 cm  LV e' lateral:   12.30 cm/s LV PW:         0.94 cm  LV E/e' lateral: 10.6 LV IVS:        0.96 cm  LV e' medial:    9.46 cm/s LVOT diam:     1.80 cm  LV E/e' medial:  13.7 LVOT Area:     2.54 cm  RIGHT VENTRICLE RV Mid diam:    2.47 cm RV S prime:     12.70 cm/s LEFT ATRIUM             Index       RIGHT ATRIUM           Index LA diam:        3.90 cm 2.65 cm/m  RA Area:     18.10 cm LA Vol (A2C):   74.3 ml 50.47 ml/m RA Volume:   48.70 ml  33.08 ml/m LA Vol (A4C):   45.2 ml 30.70 ml/m LA Biplane Vol: 62.7 ml 42.59 ml/m  AORTIC VALVE                PULMONIC VALVE AV Area (Vmax): 1.34 cm  PV Vmax:        0.80 m/s AV Vmax:        138.00 cm/s PV Peak grad:   2.6 mmHg AV Peak Grad:   7.6 mmHg    RVOT Peak grad: 1 mmHg LVOT Vmax:      72.50 cm/s  AORTA Ao Root diam: 2.20 cm MITRAL VALVE                TRICUSPID VALVE MV Area (PHT): 3.23 cm     TR Peak grad:   45.4 mmHg MV Decel Time: 235 msec     TR Vmax:        337.00 cm/s MV E velocity: 130.00 cm/s                             SHUNTS                             Systemic Diam: 1.80 cm Yvonne Kendallhristopher End MD Electronically signed by Yvonne Kendallhristopher End MD Signature Date/Time: 01/16/2020/9:15:28 AM    Final    CT HEAD CODE STROKE WO CONTRAST  Result Date: 01/14/2020 CLINICAL DATA:  Code stroke.  Aphasia.  Last seen normal 10:10 p.m. EXAM: CT HEAD  WITHOUT CONTRAST TECHNIQUE: Contiguous axial images were obtained from the base of the skull through the vertex without intravenous contrast. COMPARISON:  None. FINDINGS: Brain: There is no acute hemorrhage or other extra-axial collection. No midline shift or other mass effect. No hydrocephalus. There are old infarcts both frontal lobes, both parietal lobes, left occipital lobe and left cerebellum. There is no acute infarct visible. Vascular: No hyperdense vessel or unexpected calcification. Skull: Normal. Negative for fracture or focal lesion. Sinuses/Orbits: No acute finding. Other: None. ASPECTS [facility]([place] Stroke Program Early CT Score) - Ganglionic level infarction (caudate, lentiform nuclei, internal capsule, insula, M1-M3 cortex): 7 - Supraganglionic infarction (M4-M6 cortex): 3 Total score (0-10 with 10 being normal): 10 IMPRESSION: 1. No acute hemorrhage or mass effect. 2. Multiple old infarcts. 3. ASPECTS is 10. These results were called by telephone at the time of interpretation on 01/14/2020 at 11:49 pm to provider [facility]OL[facility] VERONESE , who verbally acknowledged these results. Electronically Signed   By: Deatra RobinsonKevin  Herman M.D.   On: 01/14/2020 23:49        Scheduled Meds: .  stroke: mapping our early stages of recovery book   Does not apply Once  . apixaban  5 mg Oral BID  . aspirin EC  81 mg Oral Daily  . atorvastatin  40 mg Oral Daily  . diltiazem  180 mg Oral Daily  . metoprolol succinate  100 mg Oral Daily  . sodium chloride flush  10 mL Intravenous Q12H   Continuous Infusions:    LOS: 1 day    Time spent: 25 minutes    Alberteen Samhristopher P Kem Parcher, MD [facility] 01/16/2020, 8:57 PM     Please page though AMION or Epic secure chat:  For password, contact charge nurse

## 2020-01-16 NOTE — Consult Note (Addendum)
Physical Medicine and Rehabilitation Consult Reason for Consult: Impaired mobility and ADLs Referring Physician: Joen Laura, MD   HPI: Julie Harmon is a 64 y.o. female w/ PMH of CVA, AF on Xarelto, COPD, depression, GAD, hyperthryroidism, recurrent DVT s/p IVC filter, who was admitted on [facility] on 01/14/20 with expressive aphasia, emesis, and diarrhea. She was found to have an acute/subacute infarct of the left frontal operculum and insula on MRI. Physical Medicine & Rehabilitation was consulted to assess candidacy for CIR.   Denies constipation, pain. +expressive aphasia Lives with her daughter but she is not home during the day.  +tremor  Review of Systems  Constitutional: Negative for chills and fever.  HENT: Negative for hearing loss and tinnitus.   Eyes: Negative for blurred vision and double vision.  Respiratory: Negative for cough and hemoptysis.   Cardiovascular: Negative for chest pain and palpitations.  Gastrointestinal: Negative for heartburn and nausea.  Genitourinary: Negative for dysuria and urgency.  Musculoskeletal: Negative for myalgias and neck pain.  Skin: Negative for rash.  Neurological: Negative for dizziness and headaches.  Endo/Heme/Allergies: Negative for environmental allergies. Does not bruise/bleed easily.  Psychiatric/Behavioral: Negative for depression and suicidal ideas.   Social History:  has no history on file for tobacco use, alcohol use, and drug use. Allergies:  Allergies  Allergen Reactions  . Iodine Swelling  . Morphine Nausea Only   Medications Prior to Admission  Medication Sig Dispense Refill  . ALPRAZolam (XANAX) 0.25 MG tablet Take 0.25 mg by mouth 2 (two) times daily as needed for anxiety.     Marland Kitchen atorvastatin (LIPITOR) 10 MG tablet Take 10 mg by mouth at bedtime.    Marland Kitchen diltiazem (CARDIZEM CD) 180 MG 24 hr capsule Take 180 mg by mouth daily.    Marland Kitchen ELIQUIS 5 MG TABS tablet Take 5 mg by mouth 2 (two) times daily.    .  furosemide (LASIX) 40 MG tablet Take 40 mg by mouth daily.    . methocarbamol (ROBAXIN) 750 MG tablet Take 750 mg by mouth every 8 (eight) hours as needed for muscle spasms.    . metoprolol succinate (TOPROL-XL) 50 MG 24 hr tablet Take 50 mg by mouth daily.    . ondansetron (ZOFRAN-ODT) 8 MG disintegrating tablet Take 8 mg by mouth every 8 (eight) hours as needed for nausea/vomiting.    . OXYCONTIN 40 MG 12 hr tablet Take 40 mg by mouth every 8 (eight) hours as needed (severe pain).       Home: Home Living Family/patient expects to be discharged to:: Private residence Living Arrangements: Parent Available Help at Discharge: Family Type of Home: House Home Access: Level entry Home Layout: Two level, Able to live on main level with bedroom/bathroom Home Equipment: None Additional Comments: reports at baseline is independent with BADL, cleans houses for work; denies baseline balance or strength issues, but later indicates she may have soem chronic LUE weakness.  Lives With: Family, Daughter  Functional History: Prior Function Level of Independence: Independent Functional Status:  Mobility: Bed Mobility Overal bed mobility: Modified Independent General bed mobility comments: HOB was elevated. increased time to perform. pt in A fib throughout. alot of effort to achieve EOB sitting Transfers Overall transfer level: Needs assistance Equipment used: 1 person hand held assist Transfers: Sit to/from Stand Sit to Stand: Min guard, Min assist, From elevated surface General transfer comment: Pt was able to stand EOB with HHA of 1. She is unsteady upon standing with rapid elevation in  HR. HR between 109-154bpm during session Ambulation/Gait Ambulation/Gait assistance: Min assist, Mod assist Gait Distance (Feet): 25 Feet Assistive device: 1 person hand held assist Gait Pattern/deviations: Shuffle, Staggering left, Staggering right, Drifts right/left, Narrow base of support, Decreased step length  - right, Decreased step length - left General Gait Details: Pt is very unsteady on feet. unable to advabnce distances 2/2 to fall risk. 3 occasions of LOB with therapist intervention during ambulation to doorway to prevent fall. heavy use of gait belt. pt fatigued quickly.  Gait velocity: decreased    ADL:    Cognition: Cognition Overall Cognitive Status: Difficult to assess Arousal/Alertness: Awake/alert Orientation Level: Oriented to person, Disoriented to place, Disoriented to time, Disoriented to situation (possibly related to expressive aphasia) Attention: Selective Selective Attention: Appears intact Memory: Appears intact Awareness: Appears intact Problem Solving: Appears intact Cognition Arousal/Alertness: Awake/alert Behavior During Therapy: WFL for tasks assessed/performed Overall Cognitive Status: Difficult to assess General Comments: difficulty to assess due to aphasia but overall seems to answer appropriately  Blood pressure (!) 144/82, pulse (!) 119, temperature 98.5 F (36.9 C), temperature source Oral, resp. rate 18, height  (1.676 m), weight 59.9 kg, SpO2 95 %. Physical Exam  General: Alert and oriented x 1 (due to aphasia primarily). Able to state that she worked at [employer] for [employer]. No apparent distress HEENT: Head is normocephalic, atraumatic, PERRLA, EOMI, sclera anicteric, oral mucosa pink and moist, dentition intact, ext ear canals clear,  Neck: Supple without JVD or lymphadenopathy Heart: Reg rate and rhythm. No murmurs rubs or gallops Chest: CTA bilaterally without wheezes, rales, or rhonchi; no distress Abdomen: Soft, non-tender, non-distended, bowel sounds positive. Extremities: No clubbing, cyanosis, or edema. Pulses are 2+ Skin: Clean and intact without signs of breakdown Neuro: Expressive>receptive aphasia. Cranial nerves 2-12 are intact except for left facial droop. Sensory exam is normal. Reflexes are 2+ in all 4's. Fine motor coordination is  intact. +full body tremor. Motor function is grossly 5/5 except for LUE 4/5. Impulsive, trying to get out of bed.  Psych: Pt's affect is appropriate. Pt is cooperative  Results for orders placed or performed during the hospital encounter of 01/14/20 (from the past 24 hour(s))  Protime-INR     Status: Abnormal   Collection Time: 01/15/20  6:28 PM  Result Value Ref Range   Prothrombin Time 22.6 (H) 11.4 - 15.2 seconds   INR 2.1 (H) 0.8 - 1.2  CBC     Status: Abnormal   Collection Time: 01/16/20  5:02 AM  Result Value Ref Range   WBC 12.9 (H) 4.0 - 10.5 K/uL   RBC 4.91 3.87 - 5.11 MIL/uL   Hemoglobin 12.3 12.0 - 15.0 g/dL   HCT 16.1 36 - 46 %   MCV 77.4 (L) 80.0 - 100.0 fL   MCH 25.1 (L) 26.0 - 34.0 pg   MCHC 32.4 30.0 - 36.0 g/dL   RDW 09.6 (H) 04.5 - 40.9 %   Platelets 235 150 - 400 K/uL   nRBC 0.0 0.0 - 0.2 %  Comprehensive metabolic panel     Status: None   Collection Time: 01/16/20  5:02 AM  Result Value Ref Range   Sodium 139 135 - 145 mmol/L   Potassium 3.7 3.5 - 5.1 mmol/L   Chloride 103 98 - 111 mmol/L   CO2 22 22 - 32 mmol/L   Glucose, Bld 96 70 - 99 mg/dL   BUN 12 8 - 23 mg/dL   Creatinine, Ser 8.11 0.44 - 1.00  mg/dL   Calcium 8.9 8.9 - 16.1 mg/dL   Total Protein 6.7 6.5 - 8.1 g/dL   Albumin 3.7 3.5 - 5.0 g/dL   AST 18 15 - 41 U/L   ALT 12 0 - 44 U/L   Alkaline Phosphatase 61 38 - 126 U/L   Total Bilirubin 1.2 0.3 - 1.2 mg/dL   GFR calc non Af Amer >60 >60 mL/min   GFR calc Af Amer >60 >60 mL/min   Anion gap 14 5 - 15  Protime-INR     Status: Abnormal   Collection Time: 01/16/20  5:02 AM  Result Value Ref Range   Prothrombin Time 20.0 (H) 11.4 - 15.2 seconds   INR 1.8 (H) 0.8 - 1.2   MR ANGIO HEAD WO CONTRAST  Result Date: 01/16/2020 CLINICAL DATA:  64 year old female with code stroke presentation on 01/14/2020. MRI revealing small acute infarct of the left operculum, fairly extensive underlying chronic bilateral infarcts. EXAM: MRA HEAD WITHOUT CONTRAST  TECHNIQUE: Angiographic images of the Circle of Willis were obtained using MRA technique without intravenous contrast. COMPARISON:  Brain MRI 01/15/2020. FINDINGS: Antegrade flow in the posterior circulation. Only the distal most vertebral arteries are included and appear normal at the vertebrobasilar junction. Patent basilar artery without stenosis. Normal SCA and left PCA origins. Fetal type right PCA origin. Right posterior communicating artery diminutive or absent. Bilateral PCA branches are within normal limits. Antegrade flow in both ICA siphons. Mild ectasia of the right siphon, measuring up to 6-7 mm diameter in the cavernous segment (series 5, image 55). No siphon stenosis, and no siphon saccular aneurysm. Ophthalmic and right posterior communicating artery origins appear normal. Patent carotid termini, MCA and ACA origins. Diminutive or absent anterior communicating artery. Visible ACA branches are within normal limits (allowing for mild motion artifact). Both MCAs bifurcate early, with mild ectasia of the right M1 segment. Visible right MCA branches are within normal limits. No discrete left MCA branch occlusion is identified on the source images. IMPRESSION: 1. Negative for large vessel occlusion. No discrete Left MCA branch occlusion is identified. 2. No significant intracranial stenosis identified. There is mild dolichoectasia of the Right ICA siphon and the Right M1 segment. Electronically Signed   By: Odessa Fleming M.D.   On: 01/16/2020 12:40   MR BRAIN WO CONTRAST  Result Date: 01/15/2020 CLINICAL DATA:  Expressive aphasia EXAM: MRI HEAD WITHOUT CONTRAST TECHNIQUE: Multiplanar, multiecho pulse sequences of the brain and surrounding structures were obtained without intravenous contrast. COMPARISON:  Head CT 01/14/2020 FINDINGS: Brain: There is an area of faint hyperintensity on diffusion-weighted imaging within the left frontal operculum and insula. No other diffusion abnormality. There is not a clear  ADC correlate. 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 These include both frontal lobes, both parietal lobes, the left occipital lobe in the left cerebellum. Early confluent hyperintense T2-weighted signal of the periventricular and deep white matter. Advanced atrophy for age. No chronic microhemorrhage. Normal midline structures. Vascular: Normal flow voids. Skull and upper cervical spine: Normal marrow signal. Sinuses/Orbits: Negative. Other: None. IMPRESSION: 1. 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 No hemorrhage or mass effect. 2. 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 Electronically Signed   By: Deatra Robinson M.D.   On: 01/15/2020 01:53   US Carotid Bilateral  Result Date: 01/16/2020 CLINICAL DATA:  Acute cerebral infarction. History of hyperlipidemia and smoking. EXAM: BILATERAL CAROTID DUPLEX ULTRASOUND TECHNIQUE: Wallace Cullens scale imaging, color Doppler and duplex ultrasound were performed  of bilateral carotid and vertebral arteries in the neck. COMPARISON:  None. FINDINGS: Criteria: Quantification of carotid stenosis is based on velocity parameters that correlate the residual internal carotid diameter with NASCET-based stenosis levels, using the diameter of the distal internal carotid lumen as the denominator for stenosis measurement. The following velocity measurements were obtained: RIGHT ICA:  56/14 cm/sec CCA:  71/21 cm/sec SYSTOLIC ICA/CCA RATIO:  0.8 ECA:  62 cm/sec LEFT ICA:  45/12 cm/sec CCA:  60/17 cm/sec SYSTOLIC ICA/CCA RATIO:  0.7 ECA:  76 cm/sec RIGHT CAROTID ARTERY: Intimal thickening present without focal plaque or evidence of carotid stenosis. RIGHT VERTEBRAL ARTERY: Antegrade flow with normal waveform and velocity. LEFT CAROTID ARTERY: Minimal plaque at the level of the carotid bulb and proximal left ICA. No significant stenosis identified with estimated left ICA stenosis of less than 50%. LEFT VERTEBRAL ARTERY: Antegrade flow with  normal waveform and velocity. IMPRESSION: Minimal plaque at the level of the left carotid bulb and proximal left ICA. Estimated left ICA stenosis is less than 50%. Electronically Signed   By: Irish Lack M.D.   On: 01/16/2020 09:16   ECHOCARDIOGRAM COMPLETE  Result Date: 01/16/2020    ECHOCARDIOGRAM REPORT   Patient Name:   OLIVIA PAVELKO Date of Exam: 01/15/2020 Medical Rec #:  [id number]     Height: Accession #:    [id number]    Weight:       124.5 lb Date of Birth:  Apr 07, 1956     BSA:          1.472 m Patient Age:    64 years      BP:           132/94 mmHg Patient Gender: F             HR:           72 bpm. Exam Location:  [facility] Procedure: 2D Echo, Cardiac Doppler and Color Doppler Indications:     TIA 435.9 / G45.9  History:         Patient has no prior history of Echocardiogram examinations.                  Stroke; Arrythmias:Atrial Fibrillation.  Sonographer:     Neysa Bonito Roar Referring Phys:  [id number] Vernetta Honey MANSY Diagnosing Phys: Yvonne Kendall MD IMPRESSIONS  1. Left ventricular ejection fraction, by estimation, is 50 to 55%. The left ventricle has low normal function. Left ventricular endocardial border not optimally defined to evaluate regional wall motion. Left ventricular diastolic parameters are indeterminate.  2. Right ventricular systolic function is low normal. The right ventricular size is normal. There is moderately elevated pulmonary artery systolic pressure.  3. Left atrial size was mildly dilated.  4. Right atrial size was mildly dilated.  5. The mitral valve is degenerative. Mild to moderate mitral valve regurgitation.  6. Tricuspid valve regurgitation is moderate.  7. Unable to determine valve morphology due to image quality. Aortic valve regurgitation is not visualized. No aortic stenosis is present.  8. The inferior vena cava is normal in size with <50% respiratory variability, suggesting right atrial pressure of 8 mmHg. FINDINGS  Left Ventricle: Left ventricular ejection fraction, by  estimation, is 50 to 55%. The left ventricle has low normal function. Left ventricular endocardial border not optimally defined to evaluate regional wall motion. The left ventricular internal cavity  size was normal in size. There is no left ventricular hypertrophy. Left ventricular diastolic parameters are indeterminate. Right Ventricle: The right ventricular  size is normal. No increase in right ventricular wall thickness. Right ventricular systolic function is low normal. There is moderately elevated pulmonary artery systolic pressure. The tricuspid regurgitant velocity  is 3.37 m/s, and with an assumed right atrial pressure of 8 mmHg, the estimated right ventricular systolic pressure is 53.4 mmHg. Left Atrium: Left atrial size was mildly dilated. Right Atrium: Right atrial size was mildly dilated. Pericardium: There is no evidence of pericardial effusion. Mitral Valve: The mitral valve is degenerative in appearance. There is mild thickening of the mitral valve leaflet(s). Mild to moderate mitral valve regurgitation. Tricuspid Valve: The tricuspid valve is not well visualized. Tricuspid valve regurgitation is moderate. Aortic Valve: Unable to determine valve morphology due to image quality. Aortic valve regurgitation is not visualized. No aortic stenosis is present. Aortic valve peak gradient measures 7.6 mmHg. Pulmonic Valve: The pulmonic valve was not well visualized. Pulmonic valve regurgitation is trivial. No evidence of pulmonic stenosis. Aorta: The aortic root is normal in size and structure. Pulmonary Artery: The pulmonary artery is not well seen. Venous: The inferior vena cava is normal in size with less than 50% respiratory variability, suggesting right atrial pressure of 8 mmHg. IAS/Shunts: The interatrial septum was not well visualized.  LEFT VENTRICLE PLAX 2D LVIDd:         3.66 cm  Diastology LVIDs:         2.84 cm  LV e' lateral:   12.30 cm/s LV PW:         0.94 cm  LV E/e' lateral: 10.6 LV IVS:         0.96 cm  LV e' medial:    9.46 cm/s LVOT diam:     1.80 cm  LV E/e' medial:  13.7 LVOT Area:     2.54 cm  RIGHT VENTRICLE RV Mid diam:    2.47 cm RV S prime:     12.70 cm/s LEFT ATRIUM             Index       RIGHT ATRIUM           Index LA diam:        3.90 cm 2.65 cm/m  RA Area:     18.10 cm LA Vol (A2C):   74.3 ml 50.47 ml/m RA Volume:   48.70 ml  33.08 ml/m LA Vol (A4C):   45.2 ml 30.70 ml/m LA Biplane Vol: 62.7 ml 42.59 ml/m  AORTIC VALVE                PULMONIC VALVE AV Area (Vmax): 1.34 cm    PV Vmax:        0.80 m/s AV Vmax:        138.00 cm/s PV Peak grad:   2.6 mmHg AV Peak Grad:   7.6 mmHg    RVOT Peak grad: 1 mmHg LVOT Vmax:      72.50 cm/s  AORTA Ao Root diam: 2.20 cm MITRAL VALVE                TRICUSPID VALVE MV Area (PHT): 3.23 cm     TR Peak grad:   45.4 mmHg MV Decel Time: 235 msec     TR Vmax:        337.00 cm/s MV E velocity: 130.00 cm/s                             SHUNTS  Systemic Diam: 1.80 cm Yvonne Kendallhristopher End MD Electronically signed by Yvonne Kendallhristopher End MD Signature Date/Time: 01/16/2020/9:15:28 AM    Final    CT HEAD CODE STROKE WO CONTRAST  Result Date: 01/14/2020 CLINICAL DATA:  Code stroke.  Aphasia.  Last seen normal 10:10 p.m. EXAM: CT HEAD WITHOUT CONTRAST TECHNIQUE: Contiguous axial images were obtained from the base of the skull through the vertex without intravenous contrast. COMPARISON:  None. FINDINGS: Brain: There is no acute hemorrhage or other extra-axial collection. No midline shift or other mass effect. No hydrocephalus. There are old infarcts both frontal lobes, both parietal lobes, left occipital lobe and left cerebellum. There is no acute infarct visible. Vascular: No hyperdense vessel or unexpected calcification. Skull: Normal. Negative for fracture or focal lesion. Sinuses/Orbits: No acute finding. Other: None. ASPECTS [facility]([place] Stroke Program Early CT Score) - Ganglionic level infarction (caudate, lentiform nuclei, internal capsule,  insula, M1-M3 cortex): 7 - Supraganglionic infarction (M4-M6 cortex): 3 Total score (0-10 with 10 being normal): 10 IMPRESSION: 1. No acute hemorrhage or mass effect. 2. Multiple old infarcts. 3. ASPECTS is 10. These results were called by telephone at the time of interpretation on 01/14/2020 at 11:49 pm to provider [facility] , who verbally acknowledged these results. Electronically Signed   By: Deatra RobinsonKevin  Herman M.D.   On: 01/14/2020 23:49     Assessment/Plan: Diagnosis: Acute/subacute infarct of left frontal operculum and insula 1. Does the need for close, 24 hr/day medical supervision in concert with the patient's rehab needs make it unreasonable for this patient to be served in a less intensive setting? Yes 2. 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 Due to bladder management, bowel management, safety, skin/wound care, disease management, medication administration, pain management and patient education, does the patient require 24 hr/day rehab nursing? Yes 4. Does the patient require coordinated care of a physician, rehab nurse, therapy disciplines of PT, OT, SLP to address physical and functional deficits in the context of the above medical diagnosis(es)? Yes Addressing deficits in the following areas: balance, endurance, locomotion, strength, transferring, bowel/bladder control, bathing, dressing, feeding, grooming, toileting, cognition, speech and psychosocial support 5. Can the patient actively participate in an intensive therapy program of at least 3 hrs of therapy per day at least 5 days per week? Yes 6. The potential for patient to make measurable gains while on inpatient rehab is excellent 7. Anticipated functional outcomes upon discharge from inpatient rehab are min assist  with PT, min assist with OT, min assist with SLP. 8. Estimated rehab length of stay  to reach the above functional goals is: 16-18 days 9. Anticipated discharge destination: Home 10. Overall Rehab/Functional Prognosis: excellent  RECOMMENDATIONS: This patient's condition is appropriate for continued rehabilitative care in the following setting: CIR Patient has agreed to participate in recommended program. Yes Note that insurance prior authorization may be required for reimbursement for recommended care.  Comment:  Julie Harmon would be an excellent CIR candidate if home support can be confirmed.Thank you for this consult. Admission coordinator to follow.    Horton ChinKrutika P Izsak Meir, MD 01/16/2020

## 2020-01-16 NOTE — Progress Notes (Signed)
Rehab Admissions Coordinator Note:  Patient was screened by Clois Dupes for appropriateness for an Inpatient Acute Rehab admit at [facility] per therapy recommendations  At this time, we are recommending Inpatient Rehab consult. I will place order per protocol and one of our admission Coordinators will follow up.  Clois Dupes RN MSN 01/16/2020, 11:43 AM  I can be reached at [phone].
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
## 2020-01-17 ENCOUNTER — Encounter: Payer: Self-pay | Admitting: Family Medicine

## 2020-01-17 LAB — COMPREHENSIVE METABOLIC PANEL
ALT: 11 U/L (ref 0–44)
AST: 18 U/L (ref 15–41)
Albumin: 3.6 g/dL (ref 3.5–5.0)
Alkaline Phosphatase: 60 U/L (ref 38–126)
Anion gap: 14 (ref 5–15)
BUN: 19 mg/dL (ref 8–23)
CO2: 22 mmol/L (ref 22–32)
Calcium: 9 mg/dL (ref 8.9–10.3)
Chloride: 104 mmol/L (ref 98–111)
Creatinine, Ser: 0.57 mg/dL (ref 0.44–1.00)
GFR calc Af Amer: >60 mL/min (ref >60)
GFR calc non Af Amer: >60 mL/min (ref >60)
Glucose, Bld: 95 mg/dL (ref 70–99)
Potassium: 3.6 mmol/L (ref 3.5–5.1)
Sodium: 140 mmol/L (ref 135–145)
Total Bilirubin: 1.4 mg/dL — ABNORMAL HIGH (ref 0.3–1.2)
Total Protein: 6.6 g/dL (ref 6.5–8.1)

## 2020-01-17 LAB — CBC
HCT: 35.9 % — ABNORMAL LOW (ref 36.0–46.0)
Hemoglobin: 11.8 g/dL — ABNORMAL LOW (ref 12.0–15.0)
MCH: 24.4 pg — ABNORMAL LOW (ref 26.0–34.0)
MCHC: 32.9 g/dL (ref 30.0–36.0)
MCV: 74.3 fL — ABNORMAL LOW (ref 80.0–100.0)
Platelets: 231 10*3/uL (ref 150–400)
RBC: 4.83 MIL/uL (ref 3.87–5.11)
RDW: 16.7 % — ABNORMAL HIGH (ref 11.5–15.5)
WBC: 11.2 10*3/uL — ABNORMAL HIGH (ref 4.0–10.5)
nRBC: 0 % (ref 0.0–0.2)

## 2020-01-17 LAB — PROTIME-INR
INR: 1.6 — ABNORMAL HIGH (ref 0.8–1.2)
Prothrombin Time: 18.1 seconds — ABNORMAL HIGH (ref 11.4–15.2)

## 2020-01-17 LAB — T4, FREE: Free T4: 1.47 ng/dL — ABNORMAL HIGH (ref 0.61–1.12)

## 2020-01-17 LAB — TSH: TSH: <0.01 u[IU]/mL — ABNORMAL LOW (ref 0.350–4.500)

## 2020-01-17 MED ORDER — ONDANSETRON HCL 4 MG/2ML IJ SOLN
4.0000 mg | Freq: Four times a day (QID) | INTRAMUSCULAR | Status: DC | PRN
  Administered 2020-01-17 (×2): 4 mg via INTRAVENOUS
  Filled 2020-01-17 (×2): qty 2

## 2020-01-17 MED ORDER — NICOTINE 14 MG/24HR TD PT24
14.0000 mg | MEDICATED_PATCH | Freq: Every day | TRANSDERMAL | Status: DC
  Administered 2020-01-17 – 2020-01-19 (×2): 14 mg via TRANSDERMAL
  Filled 2020-01-17 (×3): qty 1

## 2020-01-17 MED ORDER — DABIGATRAN ETEXILATE MESYLATE 150 MG PO CAPS
150.0000 mg | ORAL_CAPSULE | Freq: Two times a day (BID) | ORAL | Status: DC
  Administered 2020-01-17 – 2020-01-19 (×4): 150 mg via ORAL
  Filled 2020-01-17 (×5): qty 1

## 2020-01-17 NOTE — Progress Notes (Addendum)
   01/17/20 1241  Assess: MEWS Score  Level of Consciousness Alert  Assess: MEWS Score  MEWS Temp 0  MEWS Systolic 0  MEWS Pulse 2  MEWS RR 0  MEWS LOC 0  MEWS Score 2  MEWS Score Color Yellow  Assess: if the MEWS score is Yellow or Red  Were vital signs taken at a resting state? Yes  Focused Assessment Change from prior assessment (see assessment flowsheet)  Early Detection of Sepsis Score *See Row Information* Low  MEWS guidelines implemented *See Row Information* Yes  Treat  MEWS Interventions Escalated (See documentation below)  Pain Scale 0-10  Pain Score 0  Complains of Nausea /  Vomiting  Nausea relieved by Antiemetic  Patients response to intervention Decreased  Take Vital Signs  Increase Vital Sign Frequency  Yellow: Q 2hr X 2 then Q 4hr X 2, if remains yellow, continue Q 4hrs  Escalate  MEWS: Escalate Yellow: discuss with charge nurse/RN and consider discussing with provider and RRT  Notify: Charge Nurse/RN  Name of Charge Nurse/RN Notified Tammy Todd  Date Charge Nurse/RN Notified 01/17/20  Time Charge Nurse/RN Notified 1300   yellow MEWS due to HR of 114. Patient on scheduled cardizem and metoprolol.

## 2020-01-17 NOTE — Progress Notes (Signed)
Patient history of smoking 0.5 pack daily. Requesting to smoke. Contacted MD Georgeann Oppenheim who placed order for Nicoderm patch for patient. This RN will apply patch to patient.  Patient also complains of diarrhea, gave her PRN imodium.  Will continue to assess GI symptoms.

## 2020-01-17 NOTE — Progress Notes (Signed)
Assisted patient to bathroom. Not able to have a BM at this time. This RN will find rolling walker and place in room. Patient has tremor/shake when walking but safely able to ambulate. Patient will try to get out of chair but when alarms goes off, she sits right back down. Patient is compliant to sit back down when alarms go off each time. At this time we are not implementing a low bed. Discussed this with CN Larose Hires and she is aware of the patient's situation and safety requirements. I will continue to assess need for low bed and implement if necessary.

## 2020-01-17 NOTE — Progress Notes (Addendum)
Rodney Booze is password for patient. I spoke with sister Debby and Daughter on the phone together they agreed on Tasha as password> was the name of the dog.
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
## 2020-01-17 NOTE — Plan of Care (Signed)
  Problem: Education: Goal: Knowledge of disease or condition will improve Outcome: Progressing   Problem: Education: Goal: Knowledge of secondary prevention will improve Outcome: Progressing   Problem: Education: Goal: Knowledge of General Education information will improve Description: Including pain rating scale, medication(s)/side effects and non-pharmacologic comfort measures Outcome: Progressing   Handout for Stroke education provided and reviewed with Patient.

## 2020-01-17 NOTE — Progress Notes (Signed)
Occupational Therapy Treatment Patient Details Name: Julie Harmon MRN: [id number] DOB: 09-Aug-1955 Today's Date: 01/17/2020    History of present illness Julie Harmon is a 64yoF who comes to [facility] on 01/14/20 c expressive aphasia, emesis, diarrhea. PMH: CVA, AF on xarelto, COPD, depression, GAD, hyperTSH, recurrent DVT s/p IVC filter. Pt is here from [place] after evacuation, living with her sister locally. 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 Rutt was seen for OT treatment on this date. Upon arrival to room pt awake/alert, semi-supine in bed. She denies pain this and is eager to participate in OT session. Pt requests to wash her hair and sates "he was going to do it for me", referring to her AM PT session/therapist. Pt noted with improved speech this date, however, she remains apraxic and pleasantly confused. She continues to demonstrate decreased awareness of deficits, decreased safety awareness, and impaired motor planning. Therapist provides consistent education/cueing for safety/sequencing throughout functional tasks. Pt requires MIN-MOD A for ADL management including hair washing using shampoo cap, seated sponge bath, toilet transfer, and toileting this date. See ADL section below for additional detail regarding occupational performance. LUE focal weakness appears improved from past date, however, pt LUE continues to present as marginally weaker and less coordinated than her RUE. She performs functional tasks with assistance and at end of session requests assistance with calling her daughter on room phone. Pt is unable to appropriately identify/press numbers on room phone keypad and requires moderate assistance with this task. She also states she has not spoken to her daughter "in days", however, once her daughter is reached via phone she reminds the pt that they had spoken earlier this am. Pt continues to require skilled OT services  to support safety and functional independence during ADL tasks. She is progressing towards goals and remains motivated to participate in therapy sessions. Both pt and daughter (via telephone) voice strong desire for pt to DC to inpatient rehab for acute intensive rehabilitative services upon hospital DC. This remains most appropriate DC locale for this pt. Frequency also remains appropriate. Will continue to follow POC as written.     Follow Up Recommendations  CIR;Supervision - Intermittent    Equipment Recommendations  3 in 1 bedside commode    Recommendations for Other Services      Precautions / Restrictions Precautions Precautions: Fall Restrictions Weight Bearing Restrictions: No       Mobility Bed Mobility Overal bed mobility: Modified Independent;Needs Assistance Bed Mobility: Supine to Sit     Supine to sit: Supervision;HOB elevated     General bed mobility comments: HOB elevated + heavy reliance on bed rails= Mod I. from flat bed without rails requires min assist + max vcs.   Transfers Overall transfer level: Needs assistance Equipment used: Rolling walker (2 wheeled) Transfers: Sit to/from Stand Sit to Stand: Min assist;From elevated surface         General transfer comment: Pt requires Min A for STS from EOB (min elevated) and to support safe descent onto seated surface. Pt with poor eccentric control and safety awareness. Min A during functional mobility 2/2 posterior LOB.    Balance Overall balance assessment: Needs assistance Sitting-balance support: Feet supported Sitting balance-Leahy Scale: Good Sitting balance - Comments: Pt continues to fatigue with dynamic sitting balance activities. However, she maintains static sitting balance at EOB well during session.   Standing balance support: During functional activity;Bilateral upper extremity supported Standing balance-Leahy Scale: Poor  Standing balance comment: Pt continues to exhibit posterior LOB  during session.                           ADL either performed or assessed with clinical judgement   ADL Overall ADL's : Needs assistance/impaired     Grooming: Brushing hair;Sitting;Minimal assistance;Cueing for safety;Cueing for sequencing Grooming Details (indicate cue type and reason): Min A to reach hair and back and for thoroughness. Pt aware of tangles/debris in her hair, but unable to locate correctly on her head. Requires cueing for reach and identifying areas to comb. Upper Body Bathing: Sitting;Minimal assistance;Cueing for safety;Cueing for sequencing Upper Body Bathing Details (indicate cue type and reason): Pt performs hair washing using shampoo cap and upper body sponge bathing given assist this date. She continues to demo decreased seated activity tolerance and transitions to side lying part way through UB bathing task. She also is noted with decreased ability to coordinate complex movements for initiating UB bathing and at one point is observed to use her wash cloth to scrub small sections of her hair despite having already washed and combed her hair prior to UB sponge bath.             Toilet Transfer: Minimal assistance;Regular Toilet;Min guard;RW;Ambulation;Cueing for safety;Cueing for sequencing Toilet Transfer Details (indicate cue type and reason): Pt performs functional transfer to room commode given min guard to min A. She continues to require consistent cueing for sequencing/safety awareness. Min A to safely lower onto seated surface as pt noted with poor eccentric control on descent this date. Toileting- Clothing Manipulation and Hygiene: Min guard;Sitting/lateral lean;Cueing for safety;Cueing for sequencing       Functional mobility during ADLs: Minimal assistance;Rolling walker;Cueing for sequencing;Cueing for safety       Vision Baseline Vision/History: No visual deficits Patient Visual Report: No change from baseline Additional Comments: Eye  movements appear improved, with pt able to appropriately/smoothly track visual stimuli during session.   Perception     Praxis Praxis Praxis-Other Comments: Pt continues to demonstrate difficulty with praxis, requiring cueing and min A to support safety and funtional independence during ADL management.    Cognition Arousal/Alertness: Awake/alert Behavior During Therapy: Impulsive;Restless Overall Cognitive Status: Impaired/Different from baseline Area of Impairment: Orientation;Safety/judgement;Problem solving;Awareness                 Orientation Level: Disoriented to;Time;Place   Memory: Decreased short-term memory Following Commands: Follows one step commands with increased time Safety/Judgement: Decreased awareness of safety;Decreased awareness of deficits Awareness: Emergent Problem Solving: Slow processing;Requires tactile cues;Requires verbal cues;Difficulty sequencing;Decreased initiation General Comments: Pt  continues to be impulsive with poor insight of deficits/safety awareness. She remains pleasant and conversational t/o session and is motivated to return to baseline level of function.        Exercises Other Exercises Other Exercises: OT facilitates seated ADL management including hair washing, seated wash-up, toilet transfer, toileting, and functional mobility with consistent prompting for safety/sequencing t/o sesstion.   Shoulder Instructions       General Comments Pt HR monitored during session. Noted to reach 129 bmp with telemonitor indicating AFIB after functional mobility.    Pertinent Vitals/ Pain       Pain Assessment: No/denies pain  Home Living  Prior Functioning/Environment              Frequency  Min 3X/week        Progress Toward Goals  OT Goals(current goals can now be found in the care plan section)  Progress towards OT goals: Progressing toward goals  Acute Rehab OT  Goals Patient Stated Goal: go home OT Goal Formulation: With patient Time For Goal Achievement: 01/30/20  Plan Discharge plan remains appropriate;Frequency remains appropriate    Co-evaluation                 AM-PAC OT "6 Clicks" Daily Activity     Outcome Measure   Help from another person eating meals?: A Little Help from another person taking care of personal grooming?: A Little Help from another person toileting, which includes using toliet, bedpan, or urinal?: A Little Help from another person bathing (including washing, rinsing, drying)?: A Lot Help from another person to put on and taking off regular upper body clothing?: A Little Help from another person to put on and taking off regular lower body clothing?: A Lot 6 Click Score: 16    End of Session Equipment Utilized During Treatment: Gait belt;Rolling walker  OT Visit Diagnosis: Other abnormalities of gait and mobility (R26.89);Hemiplegia and hemiparesis;Cognitive communication deficit (R41.841) Symptoms and signs involving cognitive functions: Cerebral infarction Hemiplegia - Right/Left: Left Hemiplegia - dominant/non-dominant: Non-Dominant Hemiplegia - caused by: Cerebral infarction   Activity Tolerance Patient tolerated treatment well   Patient Left in bed;with call bell/phone within reach;with bed alarm set   Nurse Communication          Time: 1497-0263 OT Time Calculation (min): 38 min  Charges: OT General Charges $OT Visit: 1 Visit OT Treatments $Self Care/Home Management : 38-52 mins  Rockney Ghee, M.S., OTR/L Ascom: [phone] 01/17/20, 4:04 PM
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
## 2020-01-17 NOTE — Progress Notes (Signed)
Patient's MEWS yellow due to HR 114. Documentation complete and CN Tammy Todd notified.

## 2020-01-17 NOTE — Progress Notes (Signed)
PROGRESS NOTE    Julie Harmon  TOI:[id number] DOB: 06-May-1956 DOA: 01/14/2020 PCP: System, Pcp Not In   Brief Narrative:  Julie Harmon is a 64 y.o. F with obesity, Afib on Xarelto, hx CVA, COPD, depression, hyperthyroidism, recurrent DVTs s/p IVC filter who presented with acute aphasia.  Patient is visiting from out of town.  She felt malaise all day, then in the evening had an episode of nausea and vomiting, and afterwards could not speak, brought immediately to the ER.    In the ER, CT head unremarkable.  Evaluated by Neurology and was not candidate for tPA due to being on Xarelto.  Subsequent MRI showed left frontal operculum/insula stroke.  9/8: Seen and examined.  Sitting up in chair.  Answers all questions appropriately.  Per nursing patient has been impulsive and attempts to get up without help.  Difficult to redirect   Assessment & Plan:   Active Problems:   Expressive aphasia  Acute embolic STROKE Admission MRI showed small LEFT embolic infarct. -Non-invasive angiography showed no atherosclerosis on MRA -Echocardiogram showed EF 50-55% moderate tricuspid regurg -Carotid imaging no stenosis -Lipids ordered: on statin -Aspirin ordered at admission: Transition to Eliquis -Atrial fibrillation: Chronic, on Eliquis -tPA not given because NIH SS only 2, also on blood thinner - PT, OT and SLP have evaluated.  Recommend CIR Plan: BP omptimazation Continue PT and OT evaluations Continue Lipitor 40 Plan to discharge to CIR once bed available Switch to Pradaxa from Eliquis  Chronic atrial fibrillation HR near goal at rest, but still elevated, and up to 150s with exertion despite increasing metoprolol dose. 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  Patient was switched to a different anticoagulant today.  We will plan to monitor in house for additional 24hours.  Patient stable and tolerating Pradaxa without issue will be medically ready for discharge to CIR tomorrow      Consultants:   Neurology  Procedures:   None  Antimicrobials:   None   Subjective: Seen and examined.  No specific complaints.  A bit impulsive.  Objective: Vitals:   01/16/20 1958 01/17/20 0525 01/17/20 0816 01/17/20 1143  BP: (!) 134/95 126/76 137/85 126/88  Pulse: (!) 123 (!) 123 67 (!) 114  Resp: 19 16 15 19   Temp:  99.1 F (37.3 C) 98.8 F (37.1 C) 98.2 F (36.8 C)  TempSrc:  Oral Oral Oral  SpO2: 97% 93% 93% 96%  Weight:  49.4 kg    Height:        Intake/Output Summary (Last  24 hours) at 01/17/2020 1512 Last data filed at 01/17/2020 1347 Gross per 24 hour  Intake 1110 ml  Output --  Net 1110 ml   Filed Weights   01/14/20 2334 01/15/20 1732 01/17/20 0525  Weight: 56.5 kg 59.9 kg 49.4 kg    Examination:  General exam: Appears calm and comfortable  Respiratory system: Clear to auscultation. Respiratory effort normal. Cardiovascular system: S1 & S2 heard, RRR. No JVD, murmurs, rubs, gallops or clicks. No pedal edema. Gastrointestinal system: Abdomen is nondistended, soft and nontender. No organomegaly or masses felt. Normal bowel  sounds heard. Central nervous system: Alert and oriented to person and place.  Speech fluent.  Cysts asymmetric smile Extremities: Decreased left upper extremity power Skin: No rashes, lesions or ulcers Psychiatry: Appears somewhat anxious.  Judgment normal.    Data Reviewed: I have personally reviewed following labs and imaging studies  CBC: Recent Labs  Lab 01/14/20 2324 01/15/20 0227 01/16/20 0502 01/17/20 0554  WBC 9.3 9.5 12.9* 11.2*  NEUTROABS 7.5  --   --   --   HGB 11.4* 10.9* 12.3 11.8*  HCT 35.6* 34.3* 38.0 35.9*  MCV 77.7* 77.6* 77.4* 74.3*  PLT 231 206 235 231   Basic Metabolic Panel: Recent Labs  Lab 01/14/20 2324 01/15/20 0227 01/16/20 0502 01/17/20 0554  NA 142 142 139 140  K 3.4* 3.4* 3.7 3.6  CL 103 106 103 104  CO2 28 26 22 22   GLUCOSE 99 95 96 95  BUN 17 15 12 19   CREATININE 0.88 0.64 0.53 0.57  CALCIUM 9.1 8.5* 8.9 9.0   GFR: Estimated Creatinine Clearance: 55.4 mL/min (by C-G formula based on SCr of 0.57 mg/dL). Liver Function Tests: Recent Labs  Lab 01/14/20 2324 01/15/20 0227 01/16/20 0502 01/17/20 0554  AST 17 14* 18 18  ALT 13 11 12 11   ALKPHOS 65 58 61 60  BILITOT 1.0 0.9 1.2 1.4*  PROT 6.7 6.1* 6.7 6.6  ALBUMIN 3.8 3.3* 3.7 3.6   No results for input(s): LIPASE, AMYLASE in the last 168 hours. No results for input(s): AMMONIA in the last 168 hours. Coagulation Profile: Recent Labs  Lab 01/14/20 2324 01/15/20 1828 01/16/20 0502 01/17/20 0554  INR 1.4* 2.1* 1.8* 1.6*   Cardiac Enzymes: No results for input(s): CKTOTAL, CKMB, CKMBINDEX, TROPONINI in the last 168 hours. BNP (last 3 results) No results for input(s): PROBNP in the last 8760 hours. HbA1C: Recent Labs    01/15/20 0227  HGBA1C 5.8*   CBG: Recent Labs  Lab 01/14/20 2332  GLUCAP 76   Lipid Profile: Recent Labs    01/15/20 0227  CHOL 117  HDL 37*  LDLCALC 71  TRIG 47  CHOLHDL 3.2   Thyroid Function Tests: Recent Labs    01/17/20 0554  01/17/20 0751  TSH <0.010*  --   FREET4  --  1.47*   Anemia Panel: No results for input(s): VITAMINB12, FOLATE, FERRITIN, TIBC, IRON, RETICCTPCT in the last 72 hours. Sepsis Labs: No results for input(s): PROCALCITON, LATICACIDVEN in the last 168 hours.  Recent Results (from the past 240 hour(s))  SARS Coronavirus 2 by RT PCR (hospital order, performed in [facility] hospital lab) Nasopharyngeal Nasopharyngeal Swab     Status: None   Collection Time: 01/15/20 12:34 AM   Specimen: Nasopharyngeal Swab  Result Value Ref Range Status   SARS Coronavirus 2 NEGATIVE NEGATIVE Final    Comment: (NOTE) SARS-CoV-2 target nucleic acids are NOT DETECTED.  The SARS-CoV-2 RNA is generally detectable in  upper and lower respiratory specimens during the acute phase of infection. The lowest concentration of SARS-CoV-2 viral copies this assay can detect is 250 copies / mL. A negative result does not preclude SARS-CoV-2 infection and should not be used as the sole basis for treatment or other patient management decisions.  A negative result may occur with improper specimen collection / handling, submission of specimen other than nasopharyngeal swab, presence of viral mutation(s) within the areas targeted by this assay, and inadequate number of viral copies (<250 copies / mL). A negative result must be combined with clinical observations, patient history, and epidemiological information.  Fact Sheet for Patients:   BoilerBrush.com.cy  Fact Sheet for Healthcare Providers: https://pope.com/  This test is not yet approved or  cleared by the [place] FDA and has been authorized for detection and/or diagnosis of SARS-CoV-2 by FDA under an Emergency Use Authorization (EUA).  This EUA will remain in effect (meaning this test can be used) for the duration of the COVID-19 declaration under Section 564(b)(1) of the Act, 21 U.S.C. section 360bbb-3(b)(1),  unless the authorization is terminated or revoked sooner.  Performed at [facility], [address], [place], [place] [address]          Radiology Studies: MR ANGIO HEAD WO CONTRAST  Result Date: 01/16/2020 CLINICAL DATA:  64 year old female with code stroke presentation on 01/14/2020. MRI revealing small acute infarct of the left operculum, fairly extensive underlying chronic bilateral infarcts. EXAM: MRA HEAD WITHOUT CONTRAST TECHNIQUE: Angiographic images of the Circle of Willis were obtained using MRA technique without intravenous contrast. COMPARISON:  Brain MRI 01/15/2020. FINDINGS: Antegrade flow in the posterior circulation. Only the distal most vertebral arteries are included and appear normal at the vertebrobasilar junction. Patent basilar artery without stenosis. Normal SCA and left PCA origins. Fetal type right PCA origin. Right posterior communicating artery diminutive or absent. Bilateral PCA branches are within normal limits. Antegrade flow in both ICA siphons. Mild ectasia of the right siphon, measuring up to 6-7 mm diameter in the cavernous segment (series 5, image 55). No siphon stenosis, and no siphon saccular aneurysm. Ophthalmic and right posterior communicating artery origins appear normal. Patent carotid termini, MCA and ACA origins. Diminutive or absent anterior communicating artery. Visible ACA branches are within normal limits (allowing for mild motion artifact). Both MCAs bifurcate early, with mild ectasia of the right M1 segment. Visible right MCA branches are within normal limits. No discrete left MCA branch occlusion is identified on the source images. IMPRESSION: 1. Negative for large vessel occlusion. No discrete Left MCA branch occlusion is identified. 2. No significant intracranial stenosis identified. There is mild dolichoectasia of the Right ICA siphon and the Right M1 segment. Electronically Signed   By: Odessa Fleming M.D.   On: 01/16/2020 12:40   US Carotid  Bilateral  Result Date: 01/16/2020 CLINICAL DATA:  Acute cerebral infarction. History of hyperlipidemia and smoking. EXAM: BILATERAL CAROTID DUPLEX ULTRASOUND TECHNIQUE: Wallace Cullens scale imaging, color Doppler and duplex ultrasound were performed of bilateral carotid and vertebral arteries in the neck. COMPARISON:  None. FINDINGS: Criteria: Quantification of carotid stenosis is based on velocity parameters that correlate the residual internal carotid diameter with NASCET-based stenosis levels, using the diameter of the distal internal carotid lumen as the denominator for stenosis measurement. The following velocity measurements were obtained: RIGHT ICA:  56/14 cm/sec CCA:  71/21 cm/sec SYSTOLIC ICA/CCA RATIO:  0.8 ECA:  62 cm/sec LEFT ICA:  45/12 cm/sec CCA:  60/17 cm/sec SYSTOLIC ICA/CCA RATIO:  0.7 ECA:  76 cm/sec RIGHT CAROTID ARTERY: Intimal thickening present without focal plaque or evidence of carotid stenosis. RIGHT VERTEBRAL ARTERY: Antegrade flow with normal waveform and velocity. LEFT CAROTID ARTERY: Minimal plaque at the level of the carotid bulb and proximal left ICA. No significant stenosis identified with estimated left ICA stenosis of less than 50%. LEFT VERTEBRAL ARTERY: Antegrade flow with normal waveform and velocity. IMPRESSION: Minimal plaque at the level of the left carotid bulb and proximal left ICA. Estimated left ICA stenosis is less than 50%. Electronically Signed   By: Irish LackGlenn  Yamagata M.D.   On: 01/16/2020 09:16   ECHOCARDIOGRAM COMPLETE  Result Date: 01/16/2020    ECHOCARDIOGRAM REPORT   Patient Name:   Deneise LeverSUSAN Filion Date of Exam: 01/15/2020 Medical Rec #:  [id number]     Height: Accession #:    [id number]    Weight:       124.5 lb Date of Birth:  1955/11/23     BSA:          1.472 m Patient Age:    64 years      BP:           132/94 mmHg Patient Gender: F             HR:           72 bpm. Exam Location:  [facility] Procedure: 2D Echo, Cardiac Doppler and Color Doppler Indications:     TIA 435.9 /  G45.9  History:         Patient has no prior history of Echocardiogram examinations.                  Stroke; Arrythmias:Atrial Fibrillation.  Sonographer:     Neysa Bonitohristy Roar Referring Phys:  [id number] Vernetta HoneyJAN A MANSY Diagnosing Phys: Yvonne Kendallhristopher End MD IMPRESSIONS  1. Left ventricular ejection fraction, by estimation, is 50 to 55%. The left ventricle has low normal function. Left ventricular endocardial border not optimally defined to evaluate regional wall motion. Left ventricular diastolic parameters are indeterminate.  2. Right ventricular systolic function is low normal. The right ventricular size is normal. There is moderately elevated pulmonary artery systolic pressure.  3. Left atrial size was mildly dilated.  4. Right atrial size was mildly dilated.  5. The mitral valve is degenerative. Mild to moderate mitral valve regurgitation.  6. Tricuspid valve regurgitation is moderate.  7. Unable to determine valve morphology due to image quality. Aortic valve regurgitation is not visualized. No aortic stenosis is present.  8. The inferior vena cava is normal in size with <50% respiratory variability, suggesting right atrial pressure of 8 mmHg. FINDINGS  Left Ventricle: Left ventricular ejection fraction, by estimation, is 50 to 55%. The left ventricle has low normal function. Left ventricular endocardial border not optimally defined to evaluate regional wall motion. The left ventricular internal cavity  size was normal in size. There is no left ventricular hypertrophy. Left ventricular diastolic parameters are indeterminate. Right Ventricle: The right ventricular size is normal. No increase in right ventricular wall thickness. Right ventricular systolic function is low normal. There is moderately elevated pulmonary artery systolic pressure. The tricuspid regurgitant velocity  is 3.37 m/s, and with an assumed right atrial pressure of 8 mmHg, the estimated right ventricular systolic pressure is 53.4 mmHg. Left Atrium: Left  atrial size was mildly dilated. Right Atrium: Right atrial size was mildly dilated. Pericardium: There is no evidence of pericardial effusion. Mitral Valve: The mitral valve is degenerative in appearance. There  is mild thickening of the mitral valve leaflet(s). Mild to moderate mitral valve regurgitation. Tricuspid Valve: The tricuspid valve is not well visualized. Tricuspid valve regurgitation is moderate. Aortic Valve: Unable to determine valve morphology due to image quality. Aortic valve regurgitation is not visualized. No aortic stenosis is present. Aortic valve peak gradient measures 7.6 mmHg. Pulmonic Valve: The pulmonic valve was not well visualized. Pulmonic valve regurgitation is trivial. No evidence of pulmonic stenosis. Aorta: The aortic root is normal in size and structure. Pulmonary Artery: The pulmonary artery is not well seen. Venous: The inferior vena cava is normal in size with less than 50% respiratory variability, suggesting right atrial pressure of 8 mmHg. IAS/Shunts: The interatrial septum was not well visualized.  LEFT VENTRICLE PLAX 2D LVIDd:         3.66 cm  Diastology LVIDs:         2.84 cm  LV e' lateral:   12.30 cm/s LV PW:         0.94 cm  LV E/e' lateral: 10.6 LV IVS:        0.96 cm  LV e' medial:    9.46 cm/s LVOT diam:     1.80 cm  LV E/e' medial:  13.7 LVOT Area:     2.54 cm  RIGHT VENTRICLE RV Mid diam:    2.47 cm RV S prime:     12.70 cm/s LEFT ATRIUM             Index       RIGHT ATRIUM           Index LA diam:        3.90 cm 2.65 cm/m  RA Area:     18.10 cm LA Vol (A2C):   74.3 ml 50.47 ml/m RA Volume:   48.70 ml  33.08 ml/m LA Vol (A4C):   45.2 ml 30.70 ml/m LA Biplane Vol: 62.7 ml 42.59 ml/m  AORTIC VALVE                PULMONIC VALVE AV Area (Vmax): 1.34 cm    PV Vmax:        0.80 m/s AV Vmax:        138.00 cm/s PV Peak grad:   2.6 mmHg AV Peak Grad:   7.6 mmHg    RVOT Peak grad: 1 mmHg LVOT Vmax:      72.50 cm/s  AORTA Ao Root diam: 2.20 cm MITRAL VALVE                 TRICUSPID VALVE MV Area (PHT): 3.23 cm     TR Peak grad:   45.4 mmHg MV Decel Time: 235 msec     TR Vmax:        337.00 cm/s MV E velocity: 130.00 cm/s                             SHUNTS                             Systemic Diam: 1.80 cm Yvonne Kendall MD Electronically signed by Yvonne Kendall MD Signature Date/Time: 01/16/2020/9:15:28 AM    Final         Scheduled Meds: .  stroke: mapping our early stages of recovery book   Does not apply Once  . atorvastatin  40 mg Oral Daily  . dabigatran  150 mg Oral Q12H  .  diltiazem  300 mg Oral Daily  . metoprolol succinate  100 mg Oral Daily  . nicotine  14 mg Transdermal Daily  . sodium chloride flush  10 mL Intravenous Q12H   Continuous Infusions:   LOS: 2 days    Time spent: 25 minutes    Tresa Moore, MD [facility] Pager [phone]  If 7PM-7AM, please contact night-coverage 01/17/2020, 3:12 PM

## 2020-01-17 NOTE — Consult Note (Signed)
ANTICOAGULATION CONSULT NOTE  Pharmacy Consult for Dabigatran Indication: nonvalvular Afib  Allergies  Allergen Reactions  . Iodine Swelling  . Morphine Nausea Only    Patient Measurements: Height: 5\' 6"  (167.6 cm) Weight: 49.4 kg (109 lb) IBW/kg (Calculated) : 59.3  Vital Signs: Temp: 98.2 F (36.8 C) (09/08 1143) Temp Source: Oral (09/08 1143) BP: 126/88 (09/08 1143) Pulse Rate: 114 (09/08 1143)  Labs: Recent Labs    01/14/20 2324 01/14/20 2324 01/15/20 0227 01/15/20 0227 01/15/20 1828 01/16/20 0502 01/17/20 0554  HGB 11.4*   < > 10.9*   < >  --  12.3 11.8*  HCT 35.6*   < > 34.3*  --   --  38.0 35.9*  PLT 231   < > 206  --   --  235 231  APTT 31  --   --   --   --   --   --   LABPROT 16.6*   < >  --   --  22.6* 20.0* 18.1*  INR 1.4*   < >  --   --  2.1* 1.8* 1.6*  CREATININE 0.88   < > 0.64  --   --  0.53 0.57   < > = values in this interval not displayed.    Estimated Creatinine Clearance: 55.4 mL/min (by C-G formula based on SCr of 0.57 mg/dL).   Medical History: No past medical history on file.  Medications:  PTA Eliquis 5 mg BID  Assessment: Pharmacy has been consulted for transitioning from Eliquis to Dabigatran for a 64yo female with nonvalvular Afib and history of multiple embolic strokes over the past 3-4 years. Patient has been on warfarin in the past for multiple DVTs but had poor compliance. Patient was previously on Xarelto and was switched to Eliquis on 10/10/19, having had strokes on both of these medications. Patient is being switched to Dabigatran. Last dose of Eliquis was given 9/8 @0817 .   Goal of Therapy:  Monitor platelets by anticoagulation protocol: Yes  Plan:  -Will start Dabigatran 150mg  BID with the first dose starting 9/8 @2200 .   , PharmD Pharmacy Resident  01/17/2020 12:02 PM

## 2020-01-17 NOTE — Progress Notes (Signed)
Pt vomited a moderate amount of green-yellowish emesis.  On call provider notified; prn IV zofran ordered and administered.

## 2020-01-17 NOTE — Progress Notes (Signed)
Inpatient Rehabilitation Admissions Coordinator  Inpatient rehab consult received. I contacted pt's daughter, Fransisco Beau, by phone. They are evacuees from [place] . Arrived 9/3 to be with family, Eunice Blase, and patient admitted 9/5. Carollee Herter prefers inpt rehab if possible and SNF local to [name] if not approved. The plan was to return to [place] after any rehab venue. I will begin insurance approval with [employer] , a [employer].Ottie Glazier, RN, MSN Rehab Admissions Coordinator [phone] 01/17/2020 3:24 PM

## 2020-01-18 LAB — PROTIME-INR
INR: 1.3 — ABNORMAL HIGH (ref 0.8–1.2)
Prothrombin Time: 15.3 seconds — ABNORMAL HIGH (ref 11.4–15.2)

## 2020-01-18 LAB — T3 UPTAKE: T3 Uptake Ratio: 31 % (ref 24–39)

## 2020-01-18 LAB — T3, FREE: T3, Free: 4.1 pg/mL (ref 2.0–4.4)

## 2020-01-18 MED ORDER — METOCLOPRAMIDE HCL 10 MG PO TABS
10.0000 mg | ORAL_TABLET | Freq: Three times a day (TID) | ORAL | Status: DC
  Administered 2020-01-18 – 2020-01-19 (×3): 10 mg via ORAL
  Filled 2020-01-18 (×2): qty 1

## 2020-01-18 NOTE — Progress Notes (Signed)
Inpatient Rehabilitation Admissions Coordinator  I await bed availability at [facility] to admit. Beds limited this week. I will follow up tomorrow.  Ottie Glazier, RN, MSN Rehab Admissions Coordinator [phone] 01/18/2020 1:51 PM

## 2020-01-18 NOTE — Progress Notes (Signed)
Physical Therapy Treatment Patient Details Name: Julie Harmon MRN: [id number] DOB: February 14, 1956 Today's Date: 01/18/2020    History of Present Illness Julie Harmon is a 64yoF who comes to [facility] on 01/14/20 c expressive aphasia, emesis, diarrhea. PMH: CVA, AF on xarelto, COPD, depression, GAD, hyperTSH, recurrent DVT s/p IVC filter. Pt is here from [place] after evacuation, living with her sister locally. Per MRI report "Acute/subacute infarct of the left frontal operculum and insula."    PT Comments    Pt was sitting in bed with one leg hanging off EOB upon arriving. She has untouched breakfast tray at bedside. Unwilling to eat breakfast 2/2 to doesn't like what was given. She agrees to PT session but continues to present with impulsivity, poor insight of deficits, and poor safety awareness. Continues to be high fall risk and attempts to get OOB without assistance. Resting HR 64bpm. She sat up EOB with supervision (with HOB elevated) + vcs for improved sequencing/safety. Unwilling to use RW but did perform standing with +1 UE support. Ambulated 50 ft into hallway however HR elevated to 160s bpm. Discontinued ambulation and pt returned to room and was repositioned in bed. RN notified of HR elevation and safety concerns with pt's impulsivity. PT recommends DC to CIR when medically cleared. Will continue to follow per POC. At conclusion of session, pt was in bed with call bell in reach, bed alarm set to 2nd sensitivity, and BLEs elevated.     Follow Up Recommendations  CIR     Equipment Recommendations  Rolling walker with 5" wheels    Recommendations for Other Services       Precautions / Restrictions Precautions Precautions: Fall (A fib/ tachy cardic with minimal activity) Precaution Comments: HR elevation Restrictions Weight Bearing Restrictions: No    Mobility  Bed Mobility Overal bed mobility: Needs Assistance Bed Mobility: Supine to Sit;Sit to Supine     Supine to sit:  Supervision;HOB elevated Sit to supine: Supervision   General bed mobility comments: Supervision for safety + alot of vcs for improved technique and to slow down.  Transfers Overall transfer level: Needs assistance Equipment used: 1 person hand held assist;None (pt unwilling to use RW and wanted to perform without AD) Transfers: Sit to/from Stand Sit to Stand: Min assist;From elevated surface         General transfer comment: HHA + 1 with min assist to safely achieve standing. she continues to have tremors and is anxious with mobility. HR 64bpm that elevated to 160 bpm with minimal activity. A fib throughout  Ambulation/Gait Ambulation/Gait assistance: Min assist Gait Distance (Feet): 50 Feet Assistive device: 1 person hand held assist Gait Pattern/deviations: Shuffle;Staggering left;Staggering right;Drifts right/left;Narrow base of support;Decreased step length - right;Decreased step length - left Gait velocity: decreased   General Gait Details: Continues to have poor insight of deficits. HR elevated to 160 with short gait distance and pt was return to room and repositioned in bed. she endorses no fatigue or rapid HR. she present asymptomatic. Due to safety concerns, therapist elected not to proceed.        Balance Overall balance assessment: Needs assistance Sitting-balance support: Feet supported Sitting balance-Leahy Scale: Good Sitting balance - Comments: no LOB sitting EOB however does have more significant tremors    Standing balance support: During functional activity;No upper extremity supported Standing balance-Leahy Scale: Poor Standing balance comment: High fall risk without UE support         Cognition Arousal/Alertness: Awake/alert Behavior During Therapy: Restless;Impulsive;Anxious Overall Cognitive  Status: Impaired/Different from baseline Area of Impairment: Orientation;Safety/judgement;Problem solving;Awareness        Orientation Level:  Time;Situation   Memory: Decreased short-term memory;Decreased recall of precautions Following Commands: Follows one step commands with increased time Safety/Judgement: Decreased awareness of safety;Decreased awareness of deficits   Problem Solving: Slow processing;Decreased initiation;Difficulty sequencing;Requires verbal cues;Requires tactile cues General Comments: Pt continues to present with impulsivity. She was witness ambulating to BR without assistance. Therapist discussed safety concern with RN staff and bed alarm post session placed on more sensitive setting. Pt remains high fall risk             Pertinent Vitals/Pain Pain Assessment: No/denies pain       Prior Function            PT Goals (current goals can now be found in the care plan section) Acute Rehab PT Goals Patient Stated Goal: go home Progress towards PT goals: Progressing toward goals    Frequency    7X/week      PT Plan Current plan remains appropriate       AM-PAC PT "6 Clicks" Mobility   Outcome Measure  Help needed turning from your back to your side while in a flat bed without using bedrails?: A Little Help needed moving from lying on your back to sitting on the side of a flat bed without using bedrails?: A Little Help needed moving to and from a bed to a chair (including a wheelchair)?: A Little Help needed standing up from a chair using your arms (e.g., wheelchair or bedside chair)?: A Little Help needed to walk in hospital room?: A Lot Help needed climbing 3-5 steps with a railing? : A Lot 6 Click Score: 16    End of Session Equipment Utilized During Treatment: Gait belt Activity Tolerance: Patient tolerated treatment well Patient left: in bed;with bed alarm set;with call bell/phone within reach Nurse Communication: Mobility status PT Visit Diagnosis: Unsteadiness on feet (R26.81);Difficulty in walking, not elsewhere classified (R26.2);Muscle weakness (generalized) (M62.81);Other  symptoms and signs involving the nervous system (Q49.201)     Time: 0071-2197 PT Time Calculation (min) (ACUTE ONLY): 15 min  Charges:  $Gait Training: 8-22 mins                     Jetta Lout PTA 01/18/20, 11:21 AM

## 2020-01-18 NOTE — Progress Notes (Signed)
PROGRESS NOTE    Tanji Storrs  HYQ:[id number] DOB: Oct 15, 1955 DOA: 01/14/2020 PCP: System, Pcp Not In   Brief Narrative:  Mrs. Hopfer is a 64 y.o. F with obesity, Afib on Xarelto, hx CVA, COPD, depression, hyperthyroidism, recurrent DVTs s/p IVC filter who presented with acute aphasia.  Patient is visiting from out of town.  She felt malaise all day, then in the evening had an episode of nausea and vomiting, and afterwards could not speak, brought immediately to the ER.    In the ER, CT head unremarkable.  Evaluated by Neurology and was not candidate for tPA due to being on Xarelto.  Subsequent MRI showed left frontal operculum/insula stroke.  9/8: Seen and examined.  Sitting up in chair.  Answers all questions appropriately.  Per nursing patient has been impulsive and attempts to get up without help.  Difficult to redirect  9/9: Patient seen and examined.  Resting in bed.  A little anxious appearing but in no visible distress.  Awaiting CIR bed   Assessment & Plan:   Active Problems:   Expressive aphasia  Acute embolic STROKE Admission MRI showed small LEFT embolic infarct. -Non-invasive angiography showed no atherosclerosis on MRA -Echocardiogram showed EF 50-55% moderate tricuspid regurg -Carotid imaging no stenosis -Lipids ordered: on statin -Aspirin ordered at admission: Transition to Eliquis -Atrial fibrillation: Chronic, on Eliquis -tPA not given because NIH SS only 2, also on blood thinner - PT, OT and SLP have evaluated.  Recommend CIR Plan: BP omptimazation Continue PT and OT evaluations Continue Lipitor 40 Plan to discharge to CIR once bed available Switch to Pradaxa from Eliquis  Chronic atrial fibrillation HR near goal at rest, but still elevated, and up to 150s with exertion despite increasing metoprolol dose. -Increase diltiazem dose given this morning Plan:  Continue Pradaxa as above  Continue diltiazem  Continue metoprolol  Hold diuretic    COPD No active disease  Depression  Hyperthyroidism TSH markedly depressed Patient not on medications Free T3 and T4 as well as T3 uptake ordered  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 -Continue Pradaxa    Anemia  Chronic pain -Continue home Oxycontin   DVT prophylaxis: Pradaxa Code Status: Full Family Communication: Daughter and sister via phone 9/9 Disposition Plan: Status is: Inpatient  Remains inpatient appropriate because:Inpatient level of care appropriate due to severity of illness   Dispo: The patient is from: Home              Anticipated d/c is to: CIR              Anticipated d/c date is: 1 day              Patient currently is not medically stable to d/c.  Patient was switched to a different anticoagulant today.  We will plan to monitor in house for additional 24hours.  Patient stable and tolerating Pradaxa without issue will be medically ready for discharge to CIR tomorrow      Consultants:   Neurology  Procedures:   None  Antimicrobials:   None   Subjective: Seen and examined.  Complains of fatigue  Objective: Vitals:   01/18/20 0100 01/18/20 0522 01/18/20 0919 01/18/20 1141  BP: 124/78 130/71 108/83 (!) 137/104  Pulse: 72 99 (!) 59 82  Resp: 17  16 18   Temp: 98.1 F (36.7 C) 97.8 F (36.6 C) 98.4 F (36.9 C) 98.3 F (36.8 C)  TempSrc: Oral Oral  Oral  SpO2: 94% 92% 97% 94%  Weight:      Height:       No intake or output data in the 24 hours ending 01/18/20 1357 Filed Weights   01/14/20 2334 01/15/20 1732 01/17/20 0525  Weight: 56.5 kg 59.9 kg 49.4 kg    Examination:  General: NAD, jittery HEENT: Normocephalic, atraumatic Neck, supple, trachea midline, no tenderness Heart: Regular rate and rhythm, S1/S2 normal, no murmurs Lungs: Clear to auscultation bilaterally, no adventitious sounds, normal work of breathing Abdomen: Soft, nontender, nondistended, positive bowel sounds Extremities: Normal,  atraumatic, no clubbing or cyanosis, normal muscle tone Skin: No rashes or lesions, normal color Neurologic: Alert and oriented to person and place.  Speech fluent.  Slightly asymmetric smile Psychiatric: Normal affect    Data Reviewed: I have personally reviewed following labs and imaging studies  CBC: Recent Labs  Lab 01/14/20 2324 01/15/20 0227 01/16/20 0502 01/17/20 0554  WBC 9.3 9.5 12.9* 11.2*  NEUTROABS 7.5  --   --   --   HGB 11.4* 10.9* 12.3 11.8*  HCT 35.6* 34.3* 38.0 35.9*  MCV 77.7* 77.6* 77.4* 74.3*  PLT 231 206 235 231   Basic Metabolic Panel: Recent Labs  Lab 01/14/20 2324 01/15/20 0227 01/16/20 0502 01/17/20 0554  NA 142 142 139 140  K 3.4* 3.4* 3.7 3.6  CL 103 106 103 104  CO2 28 26 22 22   GLUCOSE 99 95 96 95  BUN 17 15 12 19   CREATININE 0.88 0.64 0.53 0.57  CALCIUM 9.1 8.5* 8.9 9.0   GFR: Estimated Creatinine Clearance: 55.4 mL/min (by C-G formula based on SCr of 0.57 mg/dL). Liver Function Tests: Recent Labs  Lab 01/14/20 2324 01/15/20 0227 01/16/20 0502 01/17/20 0554  AST 17 14* 18 18  ALT 13 11 12 11   ALKPHOS 65 58 61 60  BILITOT 1.0 0.9 1.2 1.4*  PROT 6.7 6.1* 6.7 6.6  ALBUMIN 3.8 3.3* 3.7 3.6   No results for input(s): LIPASE, AMYLASE in the last 168 hours. No results for input(s): AMMONIA in the last 168 hours. Coagulation Profile: Recent Labs  Lab 01/14/20 2324 01/15/20 1828 01/16/20 0502 01/17/20 0554 01/18/20 0642  INR 1.4* 2.1* 1.8* 1.6* 1.3*   Cardiac Enzymes: No results for input(s): CKTOTAL, CKMB, CKMBINDEX, TROPONINI in the last 168 hours. BNP (last 3 results) No results for input(s): PROBNP in the last 8760 hours. HbA1C: No results for input(s): HGBA1C in the last 72 hours. CBG: Recent Labs  Lab 01/14/20 2332  GLUCAP 76   Lipid Profile: No results for input(s): CHOL, HDL, LDLCALC, TRIG, CHOLHDL, LDLDIRECT in the last 72 hours. Thyroid Function Tests: Recent Labs    01/17/20 0554 01/17/20 0751  TSH  <0.010*  --   FREET4  --  1.47*  T3FREE  --  4.1   Anemia Panel: No results for input(s): VITAMINB12, FOLATE, FERRITIN, TIBC, IRON, RETICCTPCT in the last 72 hours. Sepsis Labs: No results for input(s): PROCALCITON, LATICACIDVEN in the last 168 hours.  Recent Results (from the past 240 hour(s))  SARS Coronavirus 2 by RT PCR (hospital order, performed in [facility] hospital lab) Nasopharyngeal Nasopharyngeal Swab     Status: None   Collection Time: 01/15/20 12:34 AM   Specimen: Nasopharyngeal Swab  Result Value Ref Range Status   SARS Coronavirus 2 NEGATIVE NEGATIVE Final    Comment: (NOTE) SARS-CoV-2 target nucleic acids are NOT DETECTED.  The SARS-CoV-2 RNA is generally detectable in upper and lower respiratory specimens during the acute phase of infection. The lowest concentration of  SARS-CoV-2 viral copies this assay can detect is 250 copies / mL. A negative result does not preclude SARS-CoV-2 infection and should not be used as the sole basis for treatment or other patient management decisions.  A negative result may occur with improper specimen collection / handling, submission of specimen other than nasopharyngeal swab, presence of viral mutation(s) within the areas targeted by this assay, and inadequate number of viral copies (<250 copies / mL). A negative result must be combined with clinical observations, patient history, and epidemiological information.  Fact Sheet for Patients:   BoilerBrush.com.cy  Fact Sheet for Healthcare Providers: https://pope.com/  This test is not yet approved or  cleared by the [place] FDA and has been authorized for detection and/or diagnosis of SARS-CoV-2 by FDA under an Emergency Use Authorization (EUA).  This EUA will remain in effect (meaning this test can be used) for the duration of the COVID-19 declaration under Section 564(b)(1) of the Act, 21 U.S.C. section 360bbb-3(b)(1),  unless the authorization is terminated or revoked sooner.  Performed at [facility], [address] [place], [place] [address]          Radiology Studies: No results found.      Scheduled Meds: .  stroke: mapping our early stages of recovery book   Does not apply Once  . atorvastatin  40 mg Oral Daily  . dabigatran  150 mg Oral Q12H  . diltiazem  300 mg Oral Daily  . metoCLOPramide  10 mg Oral TID AC  . metoprolol succinate  100 mg Oral Daily  . nicotine  14 mg Transdermal Daily  . sodium chloride flush  10 mL Intravenous Q12H   Continuous Infusions:   LOS: 3 days    Time spent: 25 minutes    Tresa Moore, MD [facility] Pager [phone]  If 7PM-7AM, please contact night-coverage 01/18/2020, 1:57 PM

## 2020-01-18 NOTE — Care Management Important Message (Signed)
Important Message  Patient Details  Name: Julie Harmon MRN: [id number] Date of Birth: April 12, 1956   Medicare Important Message Given:  Yes     [name] 01/18/2020, 2:48 PM

## 2020-01-19 ENCOUNTER — Other Ambulatory Visit: Payer: Self-pay

## 2020-01-19 ENCOUNTER — Encounter (HOSPITAL_COMMUNITY): Payer: Self-pay | Admitting: Physical Medicine & Rehabilitation

## 2020-01-19 ENCOUNTER — Inpatient Hospital Stay (HOSPITAL_COMMUNITY)
Admission: RE | Admit: 2020-01-19 | Discharge: 2020-02-01 | DRG: 056 | Disposition: A | Discharge status: Home Health | Payer: Medicare (Managed Care) | Source: Intra-hospital | Attending: Physical Medicine & Rehabilitation | Admitting: Physical Medicine & Rehabilitation

## 2020-01-19 DIAGNOSIS — I69320 Aphasia following cerebral infarction: Principal | ICD-10-CM

## 2020-01-19 DIAGNOSIS — R4701 Aphasia: Secondary | ICD-10-CM | POA: Diagnosis not present

## 2020-01-19 DIAGNOSIS — Z7901 Long term (current) use of anticoagulants: Secondary | ICD-10-CM

## 2020-01-19 DIAGNOSIS — Z79899 Other long term (current) drug therapy: Secondary | ICD-10-CM

## 2020-01-19 DIAGNOSIS — E43 Unspecified severe protein-calorie malnutrition: Secondary | ICD-10-CM | POA: Diagnosis present

## 2020-01-19 DIAGNOSIS — K5903 Drug induced constipation: Secondary | ICD-10-CM | POA: Diagnosis present

## 2020-01-19 DIAGNOSIS — J449 Chronic obstructive pulmonary disease, unspecified: Secondary | ICD-10-CM | POA: Diagnosis present

## 2020-01-19 DIAGNOSIS — E876 Hypokalemia: Secondary | ICD-10-CM | POA: Diagnosis present

## 2020-01-19 DIAGNOSIS — D72829 Elevated white blood cell count, unspecified: Secondary | ICD-10-CM | POA: Diagnosis present

## 2020-01-19 DIAGNOSIS — I639 Cerebral infarction, unspecified: Secondary | ICD-10-CM | POA: Diagnosis not present

## 2020-01-19 DIAGNOSIS — I4891 Unspecified atrial fibrillation: Secondary | ICD-10-CM | POA: Diagnosis present

## 2020-01-19 DIAGNOSIS — G894 Chronic pain syndrome: Secondary | ICD-10-CM

## 2020-01-19 DIAGNOSIS — Z681 Body mass index (BMI) 19 or less, adult: Secondary | ICD-10-CM

## 2020-01-19 DIAGNOSIS — T402X5A Adverse effect of other opioids, initial encounter: Secondary | ICD-10-CM | POA: Diagnosis present

## 2020-01-19 DIAGNOSIS — Z86718 Personal history of other venous thrombosis and embolism: Secondary | ICD-10-CM

## 2020-01-19 DIAGNOSIS — R1114 Bilious vomiting: Secondary | ICD-10-CM | POA: Diagnosis not present

## 2020-01-19 DIAGNOSIS — D7218 Eosinophilia in diseases classified elsewhere: Secondary | ICD-10-CM | POA: Diagnosis not present

## 2020-01-19 DIAGNOSIS — E785 Hyperlipidemia, unspecified: Secondary | ICD-10-CM

## 2020-01-19 DIAGNOSIS — D72823 Leukemoid reaction: Secondary | ICD-10-CM | POA: Diagnosis not present

## 2020-01-19 DIAGNOSIS — I4819 Other persistent atrial fibrillation: Secondary | ICD-10-CM | POA: Diagnosis not present

## 2020-01-19 LAB — PROTIME-INR
INR: 1.2 (ref 0.8–1.2)
Prothrombin Time: 15.1 seconds (ref 11.4–15.2)

## 2020-01-19 MED ORDER — LOPERAMIDE HCL 2 MG PO CAPS: 2.0000 mg | ORAL_CAPSULE | ORAL | 0 refills | Status: AC | PRN

## 2020-01-19 MED ORDER — OXYCODONE HCL ER 15 MG PO T12A
40.0000 mg | EXTENDED_RELEASE_TABLET | Freq: Three times a day (TID) | ORAL | Status: DC | PRN
  Administered 2020-01-19 – 2020-01-21 (×3): 40 mg via ORAL
  Filled 2020-01-19 (×3): qty 1

## 2020-01-19 MED ORDER — ADULT MULTIVITAMIN W/MINERALS CH
1.0000 | ORAL_TABLET | Freq: Every day | ORAL | Status: DC
  Administered 2020-01-19: 1 via ORAL
  Filled 2020-01-19: qty 1

## 2020-01-19 MED ORDER — ENSURE ENLIVE PO LIQD
237.0000 mL | Freq: Three times a day (TID) | ORAL | Status: DC
  Administered 2020-01-19: 237 mL via ORAL

## 2020-01-19 MED ORDER — DILTIAZEM HCL ER COATED BEADS 300 MG PO CP24: 300.0000 mg | ORAL_CAPSULE | Freq: Every day | ORAL | Status: DC

## 2020-01-19 MED ORDER — NICOTINE 14 MG/24HR TD PT24: 14.0000 mg | MEDICATED_PATCH | Freq: Every day | TRANSDERMAL | 0 refills | Status: DC

## 2020-01-19 MED ORDER — METOCLOPRAMIDE HCL 10 MG PO TABS: 10.0000 mg | ORAL_TABLET | Freq: Three times a day (TID) | ORAL | Status: DC

## 2020-01-19 MED ORDER — ONDANSETRON 8 MG PO TBDP: 8.0000 mg | ORAL_TABLET | Freq: Three times a day (TID) | ORAL | Status: DC

## 2020-01-19 MED ORDER — BOOST / RESOURCE BREEZE PO LIQD CUSTOM: 1.0000 | Freq: Two times a day (BID) | ORAL | Status: DC

## 2020-01-19 MED ORDER — DABIGATRAN ETEXILATE MESYLATE 150 MG PO CAPS: 150.0000 mg | ORAL_CAPSULE | Freq: Two times a day (BID) | ORAL | Status: DC

## 2020-01-19 MED ORDER — ONDANSETRON HCL 4 MG PO TABS
4.0000 mg | ORAL_TABLET | Freq: Three times a day (TID) | ORAL | Status: DC | PRN
  Administered 2020-01-20: 4 mg via ORAL
  Filled 2020-01-19: qty 1

## 2020-01-19 MED ORDER — ACETAMINOPHEN 325 MG PO TABS
650.0000 mg | ORAL_TABLET | ORAL | Status: DC | PRN
  Filled 2020-01-19 (×2): qty 2

## 2020-01-19 MED ORDER — ENSURE ENLIVE PO LIQD
237.0000 mL | Freq: Three times a day (TID) | ORAL | Status: DC
  Administered 2020-01-19 (×2): 237 mL via ORAL

## 2020-01-19 MED ORDER — ADULT MULTIVITAMIN W/MINERALS CH
1.0000 | ORAL_TABLET | Freq: Every day | ORAL | Status: DC
  Administered 2020-01-20 – 2020-02-01 (×13): 1 via ORAL
  Filled 2020-01-19 (×13): qty 1

## 2020-01-19 MED ORDER — METOPROLOL SUCCINATE ER 50 MG PO TB24
100.0000 mg | ORAL_TABLET | Freq: Every day | ORAL | Status: DC
  Administered 2020-01-20 – 2020-01-25 (×6): 100 mg via ORAL
  Filled 2020-01-19 (×6): qty 2

## 2020-01-19 MED ORDER — ATORVASTATIN CALCIUM 40 MG PO TABS: 40.0000 mg | ORAL_TABLET | Freq: Every day | ORAL | Status: DC

## 2020-01-19 MED ORDER — ATORVASTATIN CALCIUM 40 MG PO TABS
40.0000 mg | ORAL_TABLET | Freq: Every day | ORAL | Status: DC
  Administered 2020-01-20 – 2020-02-01 (×13): 40 mg via ORAL
  Filled 2020-01-19 (×13): qty 1

## 2020-01-19 MED ORDER — ONDANSETRON 4 MG PO TBDP
4.0000 mg | ORAL_TABLET | Freq: Three times a day (TID) | ORAL | Status: DC
  Filled 2020-01-19 (×2): qty 1

## 2020-01-19 MED ORDER — DABIGATRAN ETEXILATE MESYLATE 150 MG PO CAPS
150.0000 mg | ORAL_CAPSULE | Freq: Two times a day (BID) | ORAL | Status: DC
  Administered 2020-01-19 – 2020-02-01 (×26): 150 mg via ORAL
  Filled 2020-01-19 (×26): qty 1

## 2020-01-19 MED ORDER — ACETAMINOPHEN 650 MG RE SUPP: 650.0000 mg | RECTAL | Status: DC | PRN

## 2020-01-19 MED ORDER — ACETAMINOPHEN 160 MG/5ML PO SOLN: 650.0000 mg | ORAL | Status: DC | PRN

## 2020-01-19 MED ORDER — NICOTINE 14 MG/24HR TD PT24
14.0000 mg | MEDICATED_PATCH | Freq: Every day | TRANSDERMAL | Status: DC
  Filled 2020-01-19 (×11): qty 1

## 2020-01-19 MED ORDER — METOPROLOL SUCCINATE ER 100 MG PO TB24: 100.0000 mg | ORAL_TABLET | Freq: Every day | ORAL | 11 refills | Status: DC

## 2020-01-19 MED ORDER — DILTIAZEM HCL ER COATED BEADS 300 MG PO CP24
300.0000 mg | ORAL_CAPSULE | Freq: Every day | ORAL | Status: DC
  Administered 2020-01-20 – 2020-02-01 (×13): 300 mg via ORAL
  Filled 2020-01-19 (×13): qty 1

## 2020-01-19 MED ORDER — LOPERAMIDE HCL 2 MG PO CAPS: 2.0000 mg | ORAL_CAPSULE | ORAL | Status: DC | PRN

## 2020-01-19 MED ORDER — METOCLOPRAMIDE HCL 5 MG PO TABS
10.0000 mg | ORAL_TABLET | Freq: Three times a day (TID) | ORAL | Status: DC
  Administered 2020-01-19 – 2020-01-27 (×12): 10 mg via ORAL
  Filled 2020-01-19 (×12): qty 2

## 2020-01-19 MED ORDER — ALPRAZOLAM 0.25 MG PO TABS
0.2500 mg | ORAL_TABLET | Freq: Two times a day (BID) | ORAL | Status: DC | PRN
  Administered 2020-01-19 – 2020-02-01 (×14): 0.25 mg via ORAL
  Filled 2020-01-19 (×15): qty 1
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
## 2020-01-19 NOTE — Progress Notes (Signed)
Report given on pt to CIR

## 2020-01-19 NOTE — Progress Notes (Signed)
Pt sitting in chair, RN gave scheduled meds, Rn and pt discussed plan of care. Pt denies pain and denies further needs at this moment, call bell within reach and RN number on pt board
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
## 2020-01-19 NOTE — Progress Notes (Addendum)
Initial Nutrition Assessment  DOCUMENTATION CODES:   Severe malnutrition in context of acute illness/injury  INTERVENTION:  Provide Ensure Enlive po TID between meals, each supplement provides 350 kcal and 20 grams of protein.  Provide Boost Breeze po BID with lunch and dinner, each supplement provides 250 kcal and 9 grams of protein.  Provide daily MVI.  Encouraged adequate intake of calories and protein at meals.  If patient's PO intake does not improve recommend placing NGT for initiation of tube feeds: -Initiate Osmolite 1.2 Cal at 20 mL/hr and advance by 20 mL/hr every 8 hours to goal rate of 60 mL/hr -Provides 1728 kcal, 80 grams of protein, 1181 mL H2O daily  Monitor magnesium, potassium, and phosphorus daily for at least 3 days, MD to replete as needed, as pt is at risk for refeeding syndrome.  NUTRITION DIAGNOSIS:   Severe Malnutrition related to acute illness (inadequate oral intake since CVA 10/2019, N/V/D, also with increased calorie/protein needs related to hyperthyroidism and COPD) as evidenced by energy intake < or equal to 50% for > or equal to 5 days, moderate fat depletion, moderate-severe muscle depletion, 19.2% weight loss over 3 months.  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   9/6 pt assessed by SLP and approved for regular texture diet with cut meats and gravy to moisten with thin liquids  Met with patient at bedside. Patient slightly confused today. She reports her appetite has been poor and she does not want anything to eat. She reports it is related to N/V/D that has been ongoing. Per  NT in room patient is eating 0% of meals. Patient reports she is only taking small sips of water. She is reporting she has lost a significant amount of weight recently. Patient is amenable to trying oral nutrition supplements but reports she is unsure if she will be able to drink them.  Spoke with patient's daughter and sister over the phone. Patient and daughter are here as refugees from [place] staying with patient's sister. They report patient had a CVA in 04/2019 and then another CVA in 10/2019 that required thrombectomy. Since then patient has been experiencing N/V/D and not eating well. They report she requires strong encouragement to eat and even then will only eat 2 small meals per day. She may only have 1/2 salami and bacon sandwich. She has ben refusing oral nutrition supplements with them at home. Daughter is concerned as she reports patient cannot be left alone for any period of time. She reports patient was found walking out of the house with only adult brief and tank top. She is concerned about patient and how she is now not eating anything at all. Discussed that patient now refusing all PO intake and daughter is aware. She reports patient even refused brought in by family from home that she would usually love. Daughter reports patient's hyperthyroidism may also have something to do with the weight loss. She reports she has discussed with patient that she will need an NGT for tube feeds if she does not eat. Patient has now been refusing PO intake for 5 days.  Per daughter patient's UBW  was around 154 lbs and she has lost a significant amount of weight in the last 3 months. Patient also confirmed that her weight was around 150 lbs before her weight loss. On 9/5 she was documented to be 56.5 kg (124.5 lbs). Weight from 9/8 is 49.4 kg (109 lbs) so unsure which weight is more accurate. Patient has lost at least 29.5 lbs (19.2% body weight) over the past 3 months per patient/family report.   Discussed with  MD and RN via secure chat. Recommended NGT placement for tube feeds. However patient refused when MD discussed with her. Plan is for scheduled Reglan and Zofran before meals.   Medications reviewed and include: Reglan 10 mg TID before meals, Zofran 4 mg TID before meals.  Labs reviewed.  NUTRITION - FOCUSED PHYSICAL EXAM:    Most Recent Value  Orbital Region Moderate depletion  Upper Arm Region Moderate depletion  Thoracic and Lumbar Region Mild depletion  Buccal Region Moderate depletion  Temple Region Moderate depletion  Clavicle Bone Region Moderate depletion  Clavicle and Acromion Bone Region Moderate depletion  Scapular Bone Region Mild depletion  Dorsal Hand Moderate depletion  Patellar Region Severe depletion  Anterior Thigh Region Severe depletion  Posterior Calf Region Severe depletion  Edema (RD Assessment) None  Hair Reviewed  Eyes Reviewed  Mouth Reviewed  Skin Reviewed  Nails Reviewed     Diet Order:   Diet Order            Diet regular Room service appropriate? Yes with Assist; Fluid consistency: Thin  Diet effective now                EDUCATION NEEDS:   No education needs have been identified at this time  Skin:  Skin Assessment: Reviewed RN Assessment  Last BM:  01/17/2020  Height:   Ht Readings from Last 1 Encounters:  01/15/20 5' 6"  (1.676 m)   Weight:   Wt Readings from Last 1 Encounters:  01/17/20 49.4 kg   Ideal Body Weight:  59.1 kg  BMI:  Body mass index is 17.59 kg/m.  Estimated Nutritional Needs:   Kcal:  1600-1800  Protein:  80-90 grams  Fluid:  1.6-1.8 L/day  Jacklynn Barnacle, MS, RD, LDN Pager number available on Amion

## 2020-01-19 NOTE — Progress Notes (Signed)
Initial Nutrition Assessment  RD working remotely.  DOCUMENTATION CODES:   Severe malnutrition in context of acute illness/injury  INTERVENTION:   - 48-hour calorie count to begin at 3:00 pm today and run through 3:00 pm on Sunday, 01/21/20 (RD will provide results of calorie count on Monday, 01/22/20)  - Ensure Enlive po TID between meals, each supplement provides 350 kcal and 20 grams of protein  - Boost Breeze po BID with lunch and dinner meals, each supplement provides 250 kcal and 9 grams of protein  - MVI with minerals daily  - Encourage adequate intake of calories and protein at meals  If PO intake does not improve and calorie count demonstrates insufficient kcal and protein intake, recommend placement of Cortrak NG tube and initiation of enteral nutrition: - Recommend initiating Osmolite 1.2 @ 20 ml/hr and advancing by 20 ml/hr q 8 hours to goal rate of 60 ml/hr (1440 ml/day)  Recommended tube feeding regimen would provide 1728 kcal, 80 grams of protein, and 1181 ml free H2O daily (would meet 100% of needs).  Monitor magnesium, potassium, and phosphorus daily for at least 3 days, MD to replete as needed, as pt is at risk for refeeding syndrome given severe aucte malnutrition.  NUTRITION DIAGNOSIS:   Severe Malnutrition related to acute illness (N/V/D, inadequate PO intake since CVA in June 2021) as evidenced by moderate fat depletion, severe muscle depletion, percent weight loss (18.7% weight loss in 3 months).  GOAL:   Patient will meet greater than or equal to 90% of their needs  MONITOR:   PO intake, Supplement acceptance, Labs, Weight trends, I & O's  REASON FOR ASSESSMENT:   Consult Assessment of nutrition requirement/status  ASSESSMENT:   64 year old female with PMH of atrial fibrillation on Xarelto, CVA, COPD, depression, hyperthyroidism, recurrent DVTs s/p IVC filter. Pt admitted with acute small left embolic infarct. Therapies recommending CIR and pt  admitted to CIR on 9/10.   This RD was unable to meet with pt today and unable to reach pt via phone call to room.  Reviewed RD note from earlier today from pt's acute admission prior to transfer to CIR. Per RD note, pt was slightly confused and reported her appetite as poor related to ongoing N/V/D. Pt also reported having lost a significant amount of weight recently. At that time, pt was amenable to trying oral nutrition supplements. Will make sure that these are ordered.  RD from acute admission spoke with pt's daughter and sister via phone call. Pt's family reports that pt "has been experiencing N/V/D and not eating well [since CVA in June 2021]. They report she requires strong encouragement to eat and even then will only eat 2 small meals per day. She may only have 1/2 salami and bacon sandwich." Pt's family is aware that she is refusing all PO intake including food brought in by family from home. Pt's family is amenable to an NG tube placement for nutrition support if PO intake does not improve.  Discussed pt with PA. Plan is to complete a 48-hour calorie count to get a baseline and assess for need for NG tube.  Pt and family reporting pt's UBW as between 150-154 lbs and that weight loss has been occurring over the last 3 months since CVA in June 2021. Current weight documented as 121.9 lbs. Pt with a 28.1 lb weight loss over the last 3 months. This is an 18.7% weight loss which is severe and significant for timeframe.  Medications reviewed and include: Boost  Breeze BID with meals, Ensure Enlive TID between meals, Reglan 10 mg TID before meals, MVI with minerals, PRN Zofran  Labs reviewed.  NUTRITION - FOCUSED PHYSICAL EXAM:  Completed on 01/19/20 by Felix Pacini, RD    Most Recent Value  Orbital Region Moderate depletion  Upper Arm Region Moderate depletion  Thoracic and Lumbar Region Mild depletion  Buccal Region Moderate depletion  Temple Region Moderate depletion  Clavicle Bone  Region Moderate depletion  Clavicle and Acromion Bone Region Moderate depletion  Scapular Bone Region Mild depletion  Dorsal Hand Moderate depletion  Patellar Region Severe depletion  Anterior Thigh Region Severe depletion  Posterior Calf Region Severe depletion  Edema (RD Assessment) None  Hair Reviewed  Eyes Reviewed  Mouth Reviewed  Skin Reviewed  Nails Reviewed       Diet Order:   Diet Order            Diet regular Room service appropriate? Yes with Assist; Fluid consistency: Thin  Diet effective now                 EDUCATION NEEDS:   Not appropriate for education at this time  Skin:  Skin Assessment: Reviewed RN Assessment  Last BM:  01/17/20  Height:   Ht Readings from Last 1 Encounters:  01/19/20 5\' 6"  (1.676 m)    Weight:   Wt Readings from Last 1 Encounters:  01/19/20 55.3 kg    Ideal Body Weight:  59.1 kg  BMI:  Body mass index is 19.68 kg/m.  Estimated Nutritional Needs:   Kcal:  1650-1850  Protein:  80-95 grams  Fluid:  1.6-1.8 L    03/20/20, MS, RD, LDN Inpatient Clinical Dietitian Please see AMiON for contact information.

## 2020-01-19 NOTE — Progress Notes (Signed)
Inpatient Rehabilitation Medication Review by a Pharmacist  A complete drug regimen review was completed for this patient to identify any potential clinically significant medication issues.  Clinically significant medication issues were identified:  Yes   Type of Medication Issue Identified Description of Issue Urgent (address now) Non-Urgent (address on AM team rounds) Plan Plan Accepted by Provider? (Yes / No / Pending AM Rounds)  Drug Interaction(s) (clinically significant)       Duplicate Therapy       Allergy       No Medication Administration End Date       Incorrect Dose       Additional Drug Therapy Needed       Other  The following medications were listed on the pt's discharge summary from [facility], but not ordered at CIR: Methocarbamol PRN (med was not ordered while inpt at [facility]) Ondansetron 8 mg TID ac (pt rec'd ondansetron 4 mg TID ac at [facility]) Non urgent Secure chat to Deatra Ina, PA Pending AM rounds    Name of provider notified for urgent issues identified:  N/A  For non-urgent medication issues to be resolved on team rounds tomorrow morning a [facility] Secure Chat Handoff was sent to:  Deatra Ina, PA  Time spent performing this drug regimen review (minutes):  15  Vicki Mallet, PharmD, BCPS, [facility] Clinical Pharmacist 01/19/2020 2:05 PM

## 2020-01-19 NOTE — Plan of Care (Signed)
  Problem: Education: Goal: Knowledge of disease or condition will improve Outcome: Progressing Goal: Knowledge of secondary prevention will improve Outcome: Progressing Goal: Knowledge of patient specific risk factors addressed and post discharge goals established will improve Outcome: Progressing Goal: Individualized Educational Video(s) Outcome: Progressing   Problem: Coping: Goal: Will verbalize positive feelings about self Outcome: Progressing Goal: Will identify appropriate support needs Outcome: Progressing   Problem: Education: Goal: Knowledge of General Education information will improve Description: Including pain rating scale, medication(s)/side effects and non-pharmacologic comfort measures Outcome: Progressing   Problem: Health Behavior/Discharge Planning: Goal: Ability to manage health-related needs will improve Outcome: Progressing   Problem: Clinical Measurements: Goal: Ability to maintain clinical measurements within normal limits will improve Outcome: Progressing Goal: Will remain free from infection Outcome: Progressing Goal: Diagnostic test results will improve Outcome: Progressing Goal: Respiratory complications will improve Outcome: Progressing Goal: Cardiovascular complication will be avoided Outcome: Progressing   Problem: Activity: Goal: Risk for activity intolerance will decrease Outcome: Progressing   Problem: Nutrition: Goal: Adequate nutrition will be maintained Outcome: Progressing   Problem: Coping: Goal: Level of anxiety will decrease Outcome: Progressing   Problem: Elimination: Goal: Will not experience complications related to bowel motility Outcome: Progressing Goal: Will not experience complications related to urinary retention Outcome: Progressing   Problem: Pain Managment: Goal: General experience of comfort will improve Outcome: Progressing   Problem: Safety: Goal: Ability to remain free from injury will  improve Outcome: Progressing   Problem: Skin Integrity: Goal: Risk for impaired skin integrity will decrease Outcome: Progressing

## 2020-01-19 NOTE — Progress Notes (Signed)
Patient arrived on unit, oriented to unit. Reviewed medications, therapy schedule, rehab routine and plan of care. No complications noted at this time. Patient reports no pain and is AX[employer]

## 2020-01-19 NOTE — Progress Notes (Signed)
Horton Chin, MD  Physician  Physical Medicine and Rehabilitation  Consult Note      Addendum  Date of Service:  01/16/2020 12:53 PM      Related encounter: ED to Hosp-Admission (Discharged) from 01/14/2020 in [facility] (1A)      Expand All Collapse All  Show:Clear all Manual[x] Template[] Copied  Added by: Raulkar, Drema Pry, MD  Hover for details          Physical Medicine and Rehabilitation Consult Reason for Consult: Impaired mobility and ADLs Referring Physician: Joen Laura, MD     HPI: Julie Harmon is a 64 y.o. female w/ PMH of CVA, AF on Xarelto, COPD, depression, GAD, hyperthryroidism, recurrent DVT s/p IVC filter, who was admitted on [facility] on 01/14/20 with expressive aphasia, emesis, and diarrhea. She was found to have an acute/subacute infarct of the left frontal operculum and insula on MRI. Physical Medicine & Rehabilitation was consulted to assess candidacy for CIR.    Denies constipation, pain. +expressive aphasia Lives with her daughter but she is not home during the day.  +tremor   Review of Systems  Constitutional: Negative for chills and fever.  HENT: Negative for hearing loss and tinnitus.   Eyes: Negative for blurred vision and double vision.  Respiratory: Negative for cough and hemoptysis.   Cardiovascular: Negative for chest pain and palpitations.  Gastrointestinal: Negative for heartburn and nausea.  Genitourinary: Negative for dysuria and urgency.  Musculoskeletal: Negative for myalgias and neck pain.  Skin: Negative for rash.  Neurological: Negative for dizziness and headaches.  Endo/Heme/Allergies: Negative for environmental allergies. Does not bruise/bleed easily.  Psychiatric/Behavioral: Negative for depression and suicidal ideas.    Social History:  has no history on file for tobacco use, alcohol use, and drug use. Allergies:      Allergies  Allergen Reactions  . Iodine Swelling  .  Morphine Nausea Only          Medications Prior to Admission  Medication Sig Dispense Refill  . ALPRAZolam (XANAX) 0.25 MG tablet Take 0.25 mg by mouth 2 (two) times daily as needed for anxiety.       Marland Kitchen atorvastatin (LIPITOR) 10 MG tablet Take 10 mg by mouth at bedtime.      Marland Kitchen diltiazem (CARDIZEM CD) 180 MG 24 hr capsule Take 180 mg by mouth daily.      Marland Kitchen ELIQUIS 5 MG TABS tablet Take 5 mg by mouth 2 (two) times daily.      . furosemide (LASIX) 40 MG tablet Take 40 mg by mouth daily.      . methocarbamol (ROBAXIN) 750 MG tablet Take 750 mg by mouth every 8 (eight) hours as needed for muscle spasms.      . metoprolol succinate (TOPROL-XL) 50 MG 24 hr tablet Take 50 mg by mouth daily.      . ondansetron (ZOFRAN-ODT) 8 MG disintegrating tablet Take 8 mg by mouth every 8 (eight) hours as needed for nausea/vomiting.      . OXYCONTIN 40 MG 12 hr tablet Take 40 mg by mouth every 8 (eight) hours as needed (severe pain).           Home: Home Living Family/patient expects to be discharged to:: Private residence Living Arrangements: Parent Available Help at Discharge: Family Type of Home: House Home Access: Level entry Home Layout: Two level, Able to live on main level with bedroom/bathroom Home Equipment: None Additional Comments: reports at baseline is independent with BADL, cleans houses for work;  denies baseline balance or strength issues, but later indicates she may have soem chronic LUE weakness.  Lives With: Family, Daughter  Functional History: Prior Function Level of Independence: Independent Functional Status:  Mobility: Bed Mobility Overal bed mobility: Modified Independent General bed mobility comments: HOB was elevated. increased time to perform. pt in A fib throughout. alot of effort to achieve EOB sitting Transfers Overall transfer level: Needs assistance Equipment used: 1 person hand held assist Transfers: Sit to/from Stand Sit to Stand: Min guard, Min assist, From  elevated surface General transfer comment: Pt was able to stand EOB with HHA of 1. She is unsteady upon standing with rapid elevation in HR. HR between 109-154bpm during session Ambulation/Gait Ambulation/Gait assistance: Min assist, Mod assist Gait Distance (Feet): 25 Feet Assistive device: 1 person hand held assist Gait Pattern/deviations: Shuffle, Staggering left, Staggering right, Drifts right/left, Narrow base of support, Decreased step length - right, Decreased step length - left General Gait Details: Pt is very unsteady on feet. unable to advabnce distances 2/2 to fall risk. 3 occasions of LOB with therapist intervention during ambulation to doorway to prevent fall. heavy use of gait belt. pt fatigued quickly.  Gait velocity: decreased   ADL:   Cognition: Cognition Overall Cognitive Status: Difficult to assess Arousal/Alertness: Awake/alert Orientation Level: Oriented to person, Disoriented to place, Disoriented to time, Disoriented to situation (possibly related to expressive aphasia) Attention: Selective Selective Attention: Appears intact Memory: Appears intact Awareness: Appears intact Problem Solving: Appears intact Cognition Arousal/Alertness: Awake/alert Behavior During Therapy: WFL for tasks assessed/performed Overall Cognitive Status: Difficult to assess General Comments: difficulty to assess due to aphasia but overall seems to answer appropriately   Blood pressure (!) 144/82, pulse (!) 119, temperature 98.5 F (36.9 C), temperature source Oral, resp. rate 18, height 5\' 6"  (1.676 m), weight 59.9 kg, SpO2 95 %. Physical Exam  General: Alert and oriented x 1 (due to aphasia primarily). Able to state that she worked at [employer] for [employer]. No apparent distress HEENT: Head is normocephalic, atraumatic, PERRLA, EOMI, sclera anicteric, oral mucosa pink and moist, dentition intact, ext ear canals clear,  Neck: Supple without JVD or lymphadenopathy Heart: Reg rate and  rhythm. No murmurs rubs or gallops Chest: CTA bilaterally without wheezes, rales, or rhonchi; no distress Abdomen: Soft, non-tender, non-distended, bowel sounds positive. Extremities: No clubbing, cyanosis, or edema. Pulses are 2+ Skin: Clean and intact without signs of breakdown Neuro: Expressive>receptive aphasia. Cranial nerves 2-12 are intact except for left facial droop. Sensory exam is normal. Reflexes are 2+ in all 4's. Fine motor coordination is intact. +full body tremor. Motor function is grossly 5/5 except for LUE 4/5. Impulsive, trying to get out of bed.  Psych: Pt's affect is appropriate. Pt is cooperative   Lab Results Last 24 Hours       Results for orders placed or performed during the hospital encounter of 01/14/20 (from the past 24 hour(s))  Protime-INR     Status: Abnormal    Collection Time: 01/15/20  6:28 PM  Result Value Ref Range    Prothrombin Time 22.6 (H) 11.4 - 15.2 seconds    INR 2.1 (H) 0.8 - 1.2  CBC     Status: Abnormal    Collection Time: 01/16/20  5:02 AM  Result Value Ref Range    WBC 12.9 (H) 4.0 - 10.5 K/uL    RBC 4.91 3.87 - 5.11 MIL/uL    Hemoglobin 12.3 12.0 - 15.0 g/dL    HCT 78.238.0 36 - 46 %  MCV 77.4 (L) 80.0 - 100.0 fL    MCH 25.1 (L) 26.0 - 34.0 pg    MCHC 32.4 30.0 - 36.0 g/dL    RDW 16.1 (H) 09.6 - 15.5 %    Platelets 235 150 - 400 K/uL    nRBC 0.0 0.0 - 0.2 %  Comprehensive metabolic panel     Status: None    Collection Time: 01/16/20  5:02 AM  Result Value Ref Range    Sodium 139 135 - 145 mmol/L    Potassium 3.7 3.5 - 5.1 mmol/L    Chloride 103 98 - 111 mmol/L    CO2 22 22 - 32 mmol/L    Glucose, Bld 96 70 - 99 mg/dL    BUN 12 8 - 23 mg/dL    Creatinine, Ser 0.45 0.44 - 1.00 mg/dL    Calcium 8.9 8.9 - 40.9 mg/dL    Total Protein 6.7 6.5 - 8.1 g/dL    Albumin 3.7 3.5 - 5.0 g/dL    AST 18 15 - 41 U/L    ALT 12 0 - 44 U/L    Alkaline Phosphatase 61 38 - 126 U/L    Total Bilirubin 1.2 0.3 - 1.2 mg/dL    GFR calc non Af Amer >60  >60 mL/min    GFR calc Af Amer >60 >60 mL/min    Anion gap 14 5 - 15  Protime-INR     Status: Abnormal    Collection Time: 01/16/20  5:02 AM  Result Value Ref Range    Prothrombin Time 20.0 (H) 11.4 - 15.2 seconds    INR 1.8 (H) 0.8 - 1.2       Imaging Results (Last 48 hours)  MR ANGIO HEAD WO CONTRAST   Result Date: 01/16/2020 CLINICAL DATA:  64 year old female with code stroke presentation on 01/14/2020. MRI revealing small acute infarct of the left operculum, fairly extensive underlying chronic bilateral infarcts. EXAM: MRA HEAD WITHOUT CONTRAST TECHNIQUE: Angiographic images of the Circle of Willis were obtained using MRA technique without intravenous contrast. COMPARISON:  Brain MRI 01/15/2020. FINDINGS: Antegrade flow in the posterior circulation. Only the distal most vertebral arteries are included and appear normal at the vertebrobasilar junction. Patent basilar artery without stenosis. Normal SCA and left PCA origins. Fetal type right PCA origin. Right posterior communicating artery diminutive or absent. Bilateral PCA branches are within normal limits. Antegrade flow in both ICA siphons. Mild ectasia of the right siphon, measuring up to 6-7 mm diameter in the cavernous segment (series 5, image 55). No siphon stenosis, and no siphon saccular aneurysm. Ophthalmic and right posterior communicating artery origins appear normal. Patent carotid termini, MCA and ACA origins. Diminutive or absent anterior communicating artery. Visible ACA branches are within normal limits (allowing for mild motion artifact). Both MCAs bifurcate early, with mild ectasia of the right M1 segment. Visible right MCA branches are within normal limits. No discrete left MCA branch occlusion is identified on the source images. IMPRESSION: 1. Negative for large vessel occlusion. No discrete Left MCA branch occlusion is identified. 2. No significant intracranial stenosis identified. There is mild dolichoectasia of the Right ICA  siphon and the Right M1 segment. Electronically Signed   By: Odessa Fleming M.D.   On: 01/16/2020 12:40    MR BRAIN WO CONTRAST   Result Date: 01/15/2020 CLINICAL DATA:  Expressive aphasia EXAM: MRI HEAD WITHOUT CONTRAST TECHNIQUE: Multiplanar, multiecho pulse sequences of the brain and surrounding structures were obtained without intravenous contrast. COMPARISON:  Head CT  01/14/2020 FINDINGS: Brain: There is an area of faint hyperintensity on diffusion-weighted imaging within the left frontal operculum and insula. No other diffusion abnormality. There is not a clear ADC correlate. 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 These include both frontal lobes, both parietal lobes, the left occipital lobe in the left cerebellum. Early confluent hyperintense T2-weighted signal of the periventricular and deep white matter. Advanced atrophy for age. No chronic microhemorrhage. Normal midline structures. Vascular: Normal flow voids. Skull and upper cervical spine: Normal marrow signal. Sinuses/Orbits: Negative. Other: None. IMPRESSION: 1. 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 No hemorrhage or mass effect. 2. 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 Electronically Signed   By: Deatra Robinson M.D.   On: 01/15/2020 01:53    US Carotid Bilateral   Result Date: 01/16/2020 CLINICAL DATA:  Acute cerebral infarction. History of hyperlipidemia and smoking. EXAM: BILATERAL CAROTID DUPLEX ULTRASOUND TECHNIQUE: Wallace Cullens scale imaging, color Doppler and duplex ultrasound were performed of bilateral carotid and vertebral arteries in the neck. COMPARISON:  None. FINDINGS: Criteria: Quantification of carotid stenosis is based on velocity parameters that correlate the residual internal carotid diameter with NASCET-based stenosis levels, using the diameter of the distal internal carotid lumen as the denominator for stenosis measurement. The following velocity measurements were obtained: RIGHT  ICA:  56/14 cm/sec CCA:  71/21 cm/sec SYSTOLIC ICA/CCA RATIO:  0.8 ECA:  62 cm/sec LEFT ICA:  45/12 cm/sec CCA:  60/17 cm/sec SYSTOLIC ICA/CCA RATIO:  0.7 ECA:  76 cm/sec RIGHT CAROTID ARTERY: Intimal thickening present without focal plaque or evidence of carotid stenosis. RIGHT VERTEBRAL ARTERY: Antegrade flow with normal waveform and velocity. LEFT CAROTID ARTERY: Minimal plaque at the level of the carotid bulb and proximal left ICA. No significant stenosis identified with estimated left ICA stenosis of less than 50%. LEFT VERTEBRAL ARTERY: Antegrade flow with normal waveform and velocity. IMPRESSION: Minimal plaque at the level of the left carotid bulb and proximal left ICA. Estimated left ICA stenosis is less than 50%. Electronically Signed   By: Irish Lack M.D.   On: 01/16/2020 09:16    ECHOCARDIOGRAM COMPLETE   Result Date: 01/16/2020    ECHOCARDIOGRAM REPORT   Patient Name:   Julie Harmon Date of Exam: 01/15/2020 Medical Rec #:  [id number]     Height: Accession #:    [id number]    Weight:       124.5 lb Date of Birth:  12-29-1955     BSA:          1.472 m Patient Age:    64 years      BP:           132/94 mmHg Patient Gender: F             HR:           72 bpm. Exam Location:  [facility] Procedure: 2D Echo, Cardiac Doppler and Color Doppler Indications:     TIA 435.9 / G45.9  History:         Patient has no prior history of Echocardiogram examinations.                  Stroke; Arrythmias:Atrial Fibrillation.  Sonographer:     Neysa Bonito Roar Referring Phys:  [id number] Vernetta Honey MANSY Diagnosing Phys: Yvonne Kendall MD IMPRESSIONS  1. Left ventricular ejection fraction, by estimation, is 50 to 55%. The left ventricle has low normal function. Left ventricular endocardial border not optimally defined to evaluate regional  wall motion. Left ventricular diastolic parameters are indeterminate.  2. Right ventricular systolic function is low normal. The right ventricular size is normal. There is moderately elevated pulmonary  artery systolic pressure.  3. Left atrial size was mildly dilated.  4. Right atrial size was mildly dilated.  5. The mitral valve is degenerative. Mild to moderate mitral valve regurgitation.  6. Tricuspid valve regurgitation is moderate.  7. Unable to determine valve morphology due to image quality. Aortic valve regurgitation is not visualized. No aortic stenosis is present.  8. The inferior vena cava is normal in size with <50% respiratory variability, suggesting right atrial pressure of 8 mmHg. FINDINGS  Left Ventricle: Left ventricular ejection fraction, by estimation, is 50 to 55%. The left ventricle has low normal function. Left ventricular endocardial border not optimally defined to evaluate regional wall motion. The left ventricular internal cavity  size was normal in size. There is no left ventricular hypertrophy. Left ventricular diastolic parameters are indeterminate. Right Ventricle: The right ventricular size is normal. No increase in right ventricular wall thickness. Right ventricular systolic function is low normal. There is moderately elevated pulmonary artery systolic pressure. The tricuspid regurgitant velocity  is 3.37 m/s, and with an assumed right atrial pressure of 8 mmHg, the estimated right ventricular systolic pressure is 53.4 mmHg. Left Atrium: Left atrial size was mildly dilated. Right Atrium: Right atrial size was mildly dilated. Pericardium: There is no evidence of pericardial effusion. Mitral Valve: The mitral valve is degenerative in appearance. There is mild thickening of the mitral valve leaflet(s). Mild to moderate mitral valve regurgitation. Tricuspid Valve: The tricuspid valve is not well visualized. Tricuspid valve regurgitation is moderate. Aortic Valve: Unable to determine valve morphology due to image quality. Aortic valve regurgitation is not visualized. No aortic stenosis is present. Aortic valve peak gradient measures 7.6 mmHg. Pulmonic Valve: The pulmonic valve was not  well visualized. Pulmonic valve regurgitation is trivial. No evidence of pulmonic stenosis. Aorta: The aortic root is normal in size and structure. Pulmonary Artery: The pulmonary artery is not well seen. Venous: The inferior vena cava is normal in size with less than 50% respiratory variability, suggesting right atrial pressure of 8 mmHg. IAS/Shunts: The interatrial septum was not well visualized.  LEFT VENTRICLE PLAX 2D LVIDd:         3.66 cm  Diastology LVIDs:         2.84 cm  LV e' lateral:   12.30 cm/s LV PW:         0.94 cm  LV E/e' lateral: 10.6 LV IVS:        0.96 cm  LV e' medial:    9.46 cm/s LVOT diam:     1.80 cm  LV E/e' medial:  13.7 LVOT Area:     2.54 cm  RIGHT VENTRICLE RV Mid diam:    2.47 cm RV S prime:     12.70 cm/s LEFT ATRIUM             Index       RIGHT ATRIUM           Index LA diam:        3.90 cm 2.65 cm/m  RA Area:     18.10 cm LA Vol (A2C):   74.3 ml 50.47 ml/m RA Volume:   48.70 ml  33.08 ml/m LA Vol (A4C):   45.2 ml 30.70 ml/m LA Biplane Vol: 62.7 ml 42.59 ml/m  AORTIC VALVE  PULMONIC VALVE AV Area (Vmax): 1.34 cm    PV Vmax:        0.80 m/s AV Vmax:        138.00 cm/s PV Peak grad:   2.6 mmHg AV Peak Grad:   7.6 mmHg    RVOT Peak grad: 1 mmHg LVOT Vmax:      72.50 cm/s  AORTA Ao Root diam: 2.20 cm MITRAL VALVE                TRICUSPID VALVE MV Area (PHT): 3.23 cm     TR Peak grad:   45.4 mmHg MV Decel Time: 235 msec     TR Vmax:        337.00 cm/s MV E velocity: 130.00 cm/s                             SHUNTS                             Systemic Diam: 1.80 cm Yvonne Kendall MD Electronically signed by Yvonne Kendall MD Signature Date/Time: 01/16/2020/9:15:28 AM    Final     CT HEAD CODE STROKE WO CONTRAST   Result Date: 01/14/2020 CLINICAL DATA:  Code stroke.  Aphasia.  Last seen normal 10:10 p.m. EXAM: CT HEAD WITHOUT CONTRAST TECHNIQUE: Contiguous axial images were obtained from the base of the skull through the vertex without intravenous contrast.  COMPARISON:  None. FINDINGS: Brain: There is no acute hemorrhage or other extra-axial collection. No midline shift or other mass effect. No hydrocephalus. There are old infarcts both frontal lobes, both parietal lobes, left occipital lobe and left cerebellum. There is no acute infarct visible. Vascular: No hyperdense vessel or unexpected calcification. Skull: Normal. Negative for fracture or focal lesion. Sinuses/Orbits: No acute finding. Other: None. ASPECTS [facility] Stroke Program Early CT Score) - Ganglionic level infarction (caudate, lentiform nuclei, internal capsule, insula, M1-M3 cortex): 7 - Supraganglionic infarction (M4-M6 cortex): 3 Total score (0-10 with 10 being normal): 10 IMPRESSION: 1. No acute hemorrhage or mass effect. 2. Multiple old infarcts. 3. ASPECTS is 10. These results were called by telephone at the time of interpretation on 01/14/2020 at 11:49 pm to provider [facility] , who verbally acknowledged these results. Electronically Signed   By: Deatra Robinson M.D.   On: 01/14/2020 23:49         Assessment/Plan: Diagnosis: Acute/subacute infarct of left frontal operculum and insula 1. Does the need for close, 24 hr/day medical supervision in concert with the patient's rehab needs make it unreasonable for this patient to be served in a less intensive setting? Yes 2. 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 Due to bladder management, bowel management, safety, skin/wound care, disease management, medication administration, pain management and patient education, does the patient require 24 hr/day rehab nursing? Yes 4. Does the patient require coordinated care of a physician, rehab nurse, therapy disciplines of PT, OT, SLP to address physical and functional deficits in the context of the above medical diagnosis(es)? Yes Addressing deficits in the following  areas: balance, endurance, locomotion, strength, transferring, bowel/bladder control, bathing, dressing, feeding, grooming, toileting, cognition, speech and psychosocial support 5. Can the patient actively participate in an intensive therapy program of at least 3 hrs of therapy per day at least 5 days per week? Yes 6. The potential for  patient to make measurable gains while on inpatient rehab is excellent 7. Anticipated functional outcomes upon discharge from inpatient rehab are min assist  with PT, min assist with OT, min assist with SLP. 8. Estimated rehab length of stay to reach the above functional goals is: 16-18 days 9. Anticipated discharge destination: Home 10. Overall Rehab/Functional Prognosis: excellent   RECOMMENDATIONS: This patient's condition is appropriate for continued rehabilitative care in the following setting: CIR Patient has agreed to participate in recommended program. Yes Note that insurance prior authorization may be required for reimbursement for recommended care.   Comment:  Mrs. Neuberger would be an excellent CIR candidate if home support can be confirmed.Thank you for this consult. Admission coordinator to follow.      Horton Chin, MD 01/16/2020    Revision History                     Routing History           Note Details  Author Carlis Abbott, Drema Pry, MD File Time 01/16/2020  4:07 PM  Author Type Physician Status Addendum  Last Editor Horton Chin, MD Service Physical Medicine and Rehabilitation  Hospital Acct # [id number] Admit Date 01/19/2020

## 2020-01-19 NOTE — Progress Notes (Signed)
Physical Therapy Treatment Patient Details Name: Julie Harmon MRN: [id number] DOB: 1956-04-09 Today's Date: 01/19/2020    History of Present Illness Julie Harmon is a 64yoF who comes to [facility] on 01/14/20 c expressive aphasia, emesis, diarrhea. PMH: CVA, AF on xarelto, COPD, depression, GAD, hyperTSH, recurrent DVT s/p IVC filter. Pt is here from [place] after evacuation, living with her sister locally. 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 She agrees to PT session and is cooperative throughout. continues to present with cognition deferent than that of baseline. Was able to follow commands consistently throughout but is impulsive and has poor safety awareness. She stood and ambulated with + 1 UE support. Limited distance by HR concerns however pt reports no symptoms. Poor gait quality with shuffling pattern. Returned to room after ambulation into hallway  returned to sitting in recliner. Overall tolerated session well. Recommend DC to CIR to address deficits and improve safe functional mobility. Pt was in recliner with call bell in reach and chair alarm in place.     Follow Up Recommendations  CIR     Equipment Recommendations  Other (comment) (defer tonext level of care)    Recommendations for Other Services       Precautions / Restrictions Precautions Precautions: Fall Precaution Comments: HR elevation Restrictions Weight Bearing Restrictions: No    Mobility  Bed Mobility Overal bed mobility: Needs Assistance Bed Mobility: Supine to Sit;Sit to Supine     Supine to sit: Supervision;HOB elevated Sit to supine: Supervision   General bed mobility comments: pt was in recliner pre/post session  Transfers Overall transfer level: Needs assistance Equipment used: 1 person hand held assist Transfers: Sit to/from Stand Sit to Stand: Min assist         General transfer  comment: Min assist for safety. vcs for hand placement, technique imporvements and overall improved sequencing.  Ambulation/Gait Ambulation/Gait assistance: Min assist Gait Distance (Feet): 75 Feet Assistive device: 1 person hand held assist Gait Pattern/deviations: Shuffle;Staggering left;Staggering right;Drifts right/left;Narrow base of support;Decreased step length - right;Decreased step length - left Gait velocity: decreased   General Gait Details: Pt was able to ambulate ~ 75 ft with several occasions of unsteadiness. poor gait quality with shuffling pattern. HR between 120-150 A fib throughout.       Balance Overall balance assessment: Needs assistance Sitting-balance support: Feet supported Sitting balance-Leahy Scale: Good Sitting balance - Comments: no LOB   Standing balance support: During functional activity;No upper extremity supported Standing balance-Leahy Scale: Fair Standing balance comment: pt remains high fall risk 2/2 to impulsivity and poor insight of deficits         Cognition Arousal/Alertness: Awake/alert Behavior During Therapy: Restless;Impulsive Overall Cognitive Status: Impaired/Different from baseline Area of Impairment: Orientation;Safety/judgement;Problem solving;Awareness    Orientation Level: Time;Situation;Disoriented to   Memory: Decreased short-term memory;Decreased recall of precautions Following Commands: Follows one step commands with increased time Safety/Judgement: Decreased awareness of safety;Decreased awareness of deficits Awareness: Emergent Problem Solving: Slow processing;Decreased initiation;Difficulty sequencing;Requires verbal cues;Requires tactile cues General Comments: Pt continues to present with cognition different than baseline. she does follow commands but is impulsive with poor insight of deficits             Pertinent Vitals/Pain Pain Assessment: No/denies pain    Home Living   Living Arrangements: Children  (daughter and patient in [place] home) Available Help at Discharge: Family;Available 24 hours/day  Home Layout: One level Home Equipment: Walker - 4 wheels;Wheelchair - manual Additional Comments: daughter reports patient disabled for years, does not work and she provides supervision "total care" for all her activities    Prior Function Level of Independence:  (sueprvision with all mobility and adls from daughter)  Gait / Transfers Assistance Needed: supervision ADL's / Homemaking Assistance Needed: supervision     PT Goals (current goals can now be found in the care plan section) Acute Rehab PT Goals Patient Stated Goal: go home Progress towards PT goals: Progressing toward goals    Frequency    7X/week      PT Plan Current plan remains appropriate       AM-PAC PT "6 Clicks" Mobility   Outcome Measure  Help needed turning from your back to your side while in a flat bed without using bedrails?: A Little Help needed moving from lying on your back to sitting on the side of a flat bed without using bedrails?: A Little Help needed moving to and from a bed to a chair (including a wheelchair)?: A Little Help needed standing up from a chair using your arms (e.g., wheelchair or bedside chair)?: A Little Help needed to walk in hospital room?: A Little Help needed climbing 3-5 steps with a railing? : A Little 6 Click Score: 18    End of Session Equipment Utilized During Treatment: Gait belt Activity Tolerance: Patient tolerated treatment well Patient left: in chair;with chair alarm set;with call bell/phone within reach Nurse Communication: Mobility status PT Visit Diagnosis: Unsteadiness on feet (R26.81);Difficulty in walking, not elsewhere classified (R26.2);Muscle weakness (generalized) (M62.81);Other symptoms and signs involving the nervous system (R29.898)     Time: 9379-0240 PT Time Calculation (min) (ACUTE ONLY): 20 min  Charges:  $Gait Training: 8-22  mins                     Jetta Lout PTA 01/19/20, 11:38 AM

## 2020-01-19 NOTE — Discharge Instructions (Signed)
 Hospital Discharge After a Stroke  Being discharged from the hospital after a stroke can feel overwhelming. Many things may be different, and it is normal to feel scared or anxious. Some stroke survivors may be able to return to their homes, and others may need more specialized care on a temporary or permanent basis. Your stroke care team will work with you to develop a discharge plan that is best for you. Ask questions if you do not understand something. Invite a friend or family member to participate in discharge planning. Understanding and following your discharge plan can help to prevent another stroke or other problems. Understanding your medicines After a stroke, your health care provider may prescribe one or more types of medicine. It is important to take medicines exactly as told by your health care provider. Serious harm, such as another stroke, can happen if you are unable to take your medicine exactly as prescribed. Make sure you understand:  What medicine to take.  Why you are taking the medicine.  How and when to take it.  If it can be taken with your other medicines and herbal supplements.  Possible side effects.  When to call your health care provider if you have any side effects.  How you will get and pay for your medicines. Medical assistance programs may be able to help you pay for prescription medicines if you cannot afford them. If you are taking an anticoagulant, be sure to take it exactly as told by your health care provider. This type of medicine can increase the risk of bleeding because it works to prevent blood from clotting. You may need to take certain precautions to prevent bleeding. You should contact your health care provider if you have:  Bleeding or bruising.  A fall or other injury to your head.  Blood in your urine or stool (feces). Planning for home safety  Take steps to prevent falls, such as installing grab bars or using a shower chair. Ask a  friend or family member to get needed things in place before you go home if possible. A therapist can come to your home to make recommendations for safety equipment. Ask your health care provider if you would benefit from this service or from home care. Getting needed equipment Ask your health care provider for a list of any medical equipment and supplies you will need at home. These may include items such as:  Walkers.  Canes.  Wheelchairs.  Hand-strengthening devices.  Special eating utensils. Medical equipment can be rented or purchased, depending on your insurance coverage. Check with your insurance company about what is covered. Keeping follow-up visits After a stroke, you will need to follow up regularly with a health care provider. You may also need rehabilitation, which can include physical therapy, occupational therapy, or speech-language therapy. Keeping these appointments is very important to your recovery after a stroke. Be sure to bring your medicine list and discharge papers with you to your appointments. If you need help to keep track of your schedule, use a calendar or appointment reminder. Preventing another stroke Having a stroke puts you at risk for another stroke in the future. Ask your health care provider what actions you can take to lower the risk. These may include:  Increasing how much you exercise.  Making a healthy eating plan.  Quitting smoking.  Managing other health conditions, such as high blood pressure, high cholesterol, or diabetes.  Limiting alcohol use. Knowing the warning signs of a stroke  Make   sure you understand the signs of a stroke. Before you leave the hospital, you will receive information outlining the stroke warning signs. Share these with your friends and family members. "BE FAST" is an easy way to remember the main warning signs of a stroke:  B - Balance. Signs are dizziness, sudden trouble walking, or loss of balance.  E - Eyes.  Signs are trouble seeing or a sudden change in vision.  F - Face. Signs are sudden weakness or numbness of the face, or the face or eyelid drooping on one side.  A - Arms. Signs are weakness or numbness in an arm. This happens suddenly and usually on one side of the body.  S - Speech. Signs are sudden trouble speaking, slurred speech, or trouble understanding what people say.  T - Time. Time to call emergency services. Write down what time symptoms started. Other signs of stroke may include:  A sudden, severe headache with no known cause.  Nausea or vomiting.  Seizure. These symptoms may represent a serious problem that is an emergency. Do not wait to see if the symptoms will go away. Get medical help right away. Call your local emergency services (911 in the [place] Do not drive yourself to the hospital. Make note of the time that you had your first symptoms. Your emergency responders or emergency room staff will need to know this information. Summary  Being discharged from the hospital after a stroke can feel overwhelming. It is normal to feel scared or anxious.  Make sure you take medicines exactly as told by your health care provider.  Know the warning signs of a stroke, and get help right way if you have any of these symptoms. "BE FAST" is an easy way to remember the main warning signs of a stroke. This information is not intended to replace advice given to you by your health care provider. Make sure you discuss any questions you have with your health care provider. Document Revised: 01/18/2019 Document Reviewed: 07/31/2016 [employer] Patient Education  2020 [employer]  

## 2020-01-19 NOTE — Progress Notes (Signed)
Pt and RN discussed discharge packet, IV removed, pt dressed. Pt ready for discharge

## 2020-01-19 NOTE — Discharge Summary (Addendum)
Physician Discharge Summary  Julie Harmon WUJ:[id number] DOB: 12/16/1955 DOA: 01/14/2020  PCP: System, Pcp Not In  Admit date: 01/14/2020 Discharge date: 01/19/2020  Admitted From: Home Disposition:  CIR  Recommendations for Outpatient Follow-up:  1. Follow up with PCP in 1-2 weeks 2. Follow up with neurologist upon return to [place] 3. Establish care with an endocrinologist  Home Health:No Equipment/Devices:None  Discharge Condition:Stable CODE STATUS:Full Diet recommendation: Regular  Brief/Interim Summary: Julie Harmon is [age] y.o.F with obesity, Afib on Xarelto, hx CVA, COPD, depression, hyperthyroidism, recurrent DVTs s/p IVC filter who presented with acute aphasia.  Patient is visiting from out of town. She felt malaise all day, then in the evening had an episode of nausea and vomiting, and afterwards could not speak, brought immediately to the ER.   In the ER, CT head unremarkable. Evaluated by Neurology and was not candidate for tPA due to being on Xarelto. Subsequent MRI showed left frontal operculum/insula stroke.  9/8: Seen and examined.  Sitting up in chair.  Answers all questions appropriately.  Per nursing patient has been impulsive and attempts to get up without help.  Difficult to redirect  9/9: Patient seen and examined.  Resting in bed.  A little anxious appearing but in no visible distress.  Awaiting CIR bed  9/10: The placement secured at CIR.  Patient transitioned in stable condition.  Discussion with daughter and sister via phone yesterday.  Relayed importance of establishing with a PCP and referring to endocrinologist and neurologist upon return to [place].  [place] in [place].  Discharge Diagnoses:  Active Problems:   Expressive aphasia   Protein-calorie malnutrition, severe  Acute embolicSTROKE AdmissionMRI showedsmall LEFT embolic infarct. -Non-invasive angiographyshowed no  atherosclerosis on MRA -Echocardiogramshowed EF 50-55%moderate tricuspid regurg -Carotid imagingno stenosis -Lipids ordered: on statin, Lipitor 40 mg -Aspirin ordered at admission:Transition to Pradaxa -Atrial fibrillation:Chronic, on Pradaxa -tPA not given becauseNIH SS only 2, alsoon blood thinner - PT, OT and SLP have evaluated.  Recommend CIR Plan: BP omptimazation, controlled at time of discharge Continue PT and OT evaluations, recommending CIR Continue Lipitor 40 Continue Pradaxa Follow-up with neurology upon return to [place]  Chronic atrial fibrillation HR near goal at rest, but still elevated, and up to 150s with exertion despite increasing metoprolol dose. -Increase diltiazem dose given during hospitalization Plan:  Continue Pradaxa as above  Continue diltiazem  Continue metoprolol  Hold diuretic, held on discharge  Severe protein calorie malnutrition Chronic diarrhea Weight loss Intractable nausea vomiting The etiology of the symptoms is somewhat unclear.  Time course is also unclear.  Patient states is been going on for a month.  Patient's daughter and sister states been going on for much longer.  Could be related to underlying untreated hyperthyroidism.  Will attempt to treat symptomatically.  RD recommending NG tube placement which I discussed with the patient.  She is very reluctant to go this route.  If this is needed it can be addressed at CIR.  In the meantime I will recommend scheduled Zofran and Reglan 20 to 30 minutes prior to each meal.  This is been included in discharge med rec.  COPD No active disease  Depression  Hyperthyroidism TSH markedly depressed Patient not on medications Free T3 and T4 as well as T3 uptake ordered, within normal limits -Lengthy discussion with the patient's daughter about thyroid issues.  Related that although TSH is depressed T3 and T4 within normal limits.  Daughter relayed the patient has  been having hyperthyroid  type symptoms without further work-up I cannot start antithyroid medication at this time.  Patient's daughter related that she was previously on Synthroid which is unusual considering that the patient has hyperthyroidism.  Not entirely sure what to make of it.  Strongly recommend to the daughter that patient seek consultation with endocrinology.  Patient's daughter relates that she had previously try to get the patient to see endocrinology and the patient has been reluctant to do so.  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 -Continue Pradaxa   Anemia  Chronic pain -Continue home Oxycontin   Discharge Instructions  Discharge Instructions    Diet - low sodium heart healthy   Complete by: As directed    Increase activity slowly   Complete by: As directed      Allergies as of 01/19/2020      Reactions   Iodine Swelling   Morphine Nausea Only      Medication List    STOP taking these medications   Eliquis 5 MG Tabs tablet Generic drug: apixaban   furosemide 40 MG tablet Commonly known as: LASIX     TAKE these medications   ALPRAZolam 0.25 MG tablet Commonly known as: XANAX Take 0.25 mg by mouth 2 (two) times daily as needed for anxiety.   atorvastatin 40 MG tablet Commonly known as: LIPITOR Take 1 tablet (40 mg total) by mouth daily. What changed:   medication strength  how much to take  when to take this   dabigatran 150 MG Caps capsule Commonly known as: PRADAXA Take 1 capsule (150 mg total) by mouth every 12 (twelve) hours.   diltiazem 300 MG 24 hr capsule Commonly known as: CARDIZEM CD Take 1 capsule (300 mg total) by mouth daily. What changed:   medication strength  how much to take   loperamide 2 MG capsule Commonly known as: IMODIUM Take 1 capsule (2 mg total) by mouth as needed for diarrhea or loose stools.   methocarbamol 750 MG tablet Commonly known as: ROBAXIN Take 750 mg by mouth every 8 (eight) hours as needed for muscle  spasms.   metoCLOPramide 10 MG tablet Commonly known as: REGLAN Take 1 tablet (10 mg total) by mouth 3 (three) times daily before meals.   metoprolol succinate 100 MG 24 hr tablet Commonly known as: Toprol XL Take 1 tablet (100 mg total) by mouth daily. Take with or immediately following a meal. What changed:   medication strength  how much to take  additional instructions   nicotine 14 mg/24hr patch Commonly known as: NICODERM CQ - dosed in mg/24 hours Place 1 patch (14 mg total) onto the skin daily.   ondansetron 8 MG disintegrating tablet Commonly known as: ZOFRAN-ODT Take 1 tablet (8 mg total) by mouth 3 (three) times daily before meals. What changed:   when to take this  reasons to take this   OxyCONTIN 40 mg 12 hr tablet Generic drug: oxyCODONE Take 40 mg by mouth every 8 (eight) hours as needed (severe pain).       Allergies  Allergen Reactions  . Iodine Swelling  . Morphine Nausea Only    Consultations:  Neurology   Procedures/Studies: MR ANGIO HEAD WO CONTRAST  Result Date: 01/16/2020 CLINICAL DATA:  64 year old female with code stroke presentation on 01/14/2020. MRI revealing small acute infarct of the left operculum, fairly extensive underlying chronic bilateral infarcts. EXAM: MRA HEAD WITHOUT CONTRAST TECHNIQUE: Angiographic images of the Circle of Willis were obtained using  MRA technique without intravenous contrast. COMPARISON:  Brain MRI 01/15/2020. FINDINGS: Antegrade flow in the posterior circulation. Only the distal most vertebral arteries are included and appear normal at the vertebrobasilar junction. Patent basilar artery without stenosis. Normal SCA and left PCA origins. Fetal type right PCA origin. Right posterior communicating artery diminutive or absent. Bilateral PCA branches are within normal limits. Antegrade flow in both ICA siphons. Mild ectasia of the right siphon, measuring up to 6-7 mm diameter in the cavernous segment (series 5,  image 55). No siphon stenosis, and no siphon saccular aneurysm. Ophthalmic and right posterior communicating artery origins appear normal. Patent carotid termini, MCA and ACA origins. Diminutive or absent anterior communicating artery. Visible ACA branches are within normal limits (allowing for mild motion artifact). Both MCAs bifurcate early, with mild ectasia of the right M1 segment. Visible right MCA branches are within normal limits. No discrete left MCA branch occlusion is identified on the source images. IMPRESSION: 1. Negative for large vessel occlusion. No discrete Left MCA branch occlusion is identified. 2. No significant intracranial stenosis identified. There is mild dolichoectasia of the Right ICA siphon and the Right M1 segment. Electronically Signed   By: Odessa Fleming M.D.   On: 01/16/2020 12:40   MR BRAIN WO CONTRAST  Result Date: 01/15/2020 CLINICAL DATA:  Expressive aphasia EXAM: MRI HEAD WITHOUT CONTRAST TECHNIQUE: Multiplanar, multiecho pulse sequences of the brain and surrounding structures were obtained without intravenous contrast. COMPARISON:  Head CT 01/14/2020 FINDINGS: Brain: There is an area of faint hyperintensity on diffusion-weighted imaging within the left frontal operculum and insula. No other diffusion abnormality. There is not a clear ADC correlate. 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 These include both frontal lobes, both parietal lobes, the left occipital lobe in the left cerebellum. Early confluent hyperintense T2-weighted signal of the periventricular and deep white matter. Advanced atrophy for age. No chronic microhemorrhage. Normal midline structures. Vascular: Normal flow voids. Skull and upper cervical spine: Normal marrow signal. Sinuses/Orbits: Negative. Other: None. IMPRESSION: 1. 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 No hemorrhage or mass effect. 2. 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 Electronically Signed    By: Deatra Robinson M.D.   On: 01/15/2020 01:53   US Carotid Bilateral  Result Date: 01/16/2020 CLINICAL DATA:  Acute cerebral infarction. History of hyperlipidemia and smoking. EXAM: BILATERAL CAROTID DUPLEX ULTRASOUND TECHNIQUE: Wallace Cullens scale imaging, color Doppler and duplex ultrasound were performed of bilateral carotid and vertebral arteries in the neck. COMPARISON:  None. FINDINGS: Criteria: Quantification of carotid stenosis is based on velocity parameters that correlate the residual internal carotid diameter with NASCET-based stenosis levels, using the diameter of the distal internal carotid lumen as the denominator for stenosis measurement. The following velocity measurements were obtained: RIGHT ICA:  56/14 cm/sec CCA:  71/21 cm/sec SYSTOLIC ICA/CCA RATIO:  0.8 ECA:  62 cm/sec LEFT ICA:  45/12 cm/sec CCA:  60/17 cm/sec SYSTOLIC ICA/CCA RATIO:  0.7 ECA:  76 cm/sec RIGHT CAROTID ARTERY: Intimal thickening present without focal plaque or evidence of carotid stenosis. RIGHT VERTEBRAL ARTERY: Antegrade flow with normal waveform and velocity. LEFT CAROTID ARTERY: Minimal plaque at the level of the carotid bulb and proximal left ICA. No significant stenosis identified with estimated left ICA stenosis of less than 50%. LEFT VERTEBRAL ARTERY: Antegrade flow with normal waveform and velocity. IMPRESSION: Minimal plaque at the level of the left carotid bulb and proximal left ICA. Estimated left ICA stenosis is less than 50%. Electronically Signed  By: Irish Lack M.D.   On: 01/16/2020 09:16   ECHOCARDIOGRAM COMPLETE  Result Date: 01/16/2020    ECHOCARDIOGRAM REPORT   Patient Name:   Julie Harmon Date of Exam: 01/15/2020 Medical Rec #:  [id number]     Height: Accession #:    [id number]    Weight:       124.5 lb Date of Birth:  1955/08/21     BSA:          1.472 m Patient Age:    64 years      BP:           132/94 mmHg Patient Gender: F             HR:           72 bpm. Exam Location:  [facility] Procedure: 2D Echo,  Cardiac Doppler and Color Doppler Indications:     TIA 435.9 / G45.9  History:         Patient has no prior history of Echocardiogram examinations.                  Stroke; Arrythmias:Atrial Fibrillation.  Sonographer:     Neysa Bonito Roar Referring Phys:  [id number] Vernetta Honey MANSY Diagnosing Phys: Yvonne Kendall MD IMPRESSIONS  1. Left ventricular ejection fraction, by estimation, is 50 to 55%. The left ventricle has low normal function. Left ventricular endocardial border not optimally defined to evaluate regional wall motion. Left ventricular diastolic parameters are indeterminate.  2. Right ventricular systolic function is low normal. The right ventricular size is normal. There is moderately elevated pulmonary artery systolic pressure.  3. Left atrial size was mildly dilated.  4. Right atrial size was mildly dilated.  5. The mitral valve is degenerative. Mild to moderate mitral valve regurgitation.  6. Tricuspid valve regurgitation is moderate.  7. Unable to determine valve morphology due to image quality. Aortic valve regurgitation is not visualized. No aortic stenosis is present.  8. The inferior vena cava is normal in size with <50% respiratory variability, suggesting right atrial pressure of 8 mmHg. FINDINGS  Left Ventricle: Left ventricular ejection fraction, by estimation, is 50 to 55%. The left ventricle has low normal function. Left ventricular endocardial border not optimally defined to evaluate regional wall motion. The left ventricular internal cavity  size was normal in size. There is no left ventricular hypertrophy. Left ventricular diastolic parameters are indeterminate. Right Ventricle: The right ventricular size is normal. No increase in right ventricular wall thickness. Right ventricular systolic function is low normal. There is moderately elevated pulmonary artery systolic pressure. The tricuspid regurgitant velocity  is 3.37 m/s, and with an assumed right atrial pressure of 8 mmHg, the estimated right  ventricular systolic pressure is 53.4 mmHg. Left Atrium: Left atrial size was mildly dilated. Right Atrium: Right atrial size was mildly dilated. Pericardium: There is no evidence of pericardial effusion. Mitral Valve: The mitral valve is degenerative in appearance. There is mild thickening of the mitral valve leaflet(s). Mild to moderate mitral valve regurgitation. Tricuspid Valve: The tricuspid valve is not well visualized. Tricuspid valve regurgitation is moderate. Aortic Valve: Unable to determine valve morphology due to image quality. Aortic valve regurgitation is not visualized. No aortic stenosis is present. Aortic valve peak gradient measures 7.6 mmHg. Pulmonic Valve: The pulmonic valve was not well visualized. Pulmonic valve regurgitation is trivial. No evidence of pulmonic stenosis. Aorta: The aortic root is normal in size and structure. Pulmonary Artery: The pulmonary artery is not well seen.  Venous: The inferior vena cava is normal in size with less than 50% respiratory variability, suggesting right atrial pressure of 8 mmHg. IAS/Shunts: The interatrial septum was not well visualized.  LEFT VENTRICLE PLAX 2D LVIDd:         3.66 cm  Diastology LVIDs:         2.84 cm  LV e' lateral:   12.30 cm/s LV PW:         0.94 cm  LV E/e' lateral: 10.6 LV IVS:        0.96 cm  LV e' medial:    9.46 cm/s LVOT diam:     1.80 cm  LV E/e' medial:  13.7 LVOT Area:     2.54 cm  RIGHT VENTRICLE RV Mid diam:    2.47 cm RV S prime:     12.70 cm/s LEFT ATRIUM             Index       RIGHT ATRIUM           Index LA diam:        3.90 cm 2.65 cm/m  RA Area:     18.10 cm LA Vol (A2C):   74.3 ml 50.47 ml/m RA Volume:   48.70 ml  33.08 ml/m LA Vol (A4C):   45.2 ml 30.70 ml/m LA Biplane Vol: 62.7 ml 42.59 ml/m  AORTIC VALVE                PULMONIC VALVE AV Area (Vmax): 1.34 cm    PV Vmax:        0.80 m/s AV Vmax:        138.00 cm/s PV Peak grad:   2.6 mmHg AV Peak Grad:   7.6 mmHg    RVOT Peak grad: 1 mmHg LVOT Vmax:      72.50  cm/s  AORTA Ao Root diam: 2.20 cm MITRAL VALVE                TRICUSPID VALVE MV Area (PHT): 3.23 cm     TR Peak grad:   45.4 mmHg MV Decel Time: 235 msec     TR Vmax:        337.00 cm/s MV E velocity: 130.00 cm/s                             SHUNTS                             Systemic Diam: 1.80 cm Yvonne Kendall MD Electronically signed by Yvonne Kendall MD Signature Date/Time: 01/16/2020/9:15:28 AM    Final    CT HEAD CODE STROKE WO CONTRAST  Result Date: 01/14/2020 CLINICAL DATA:  Code stroke.  Aphasia.  Last seen normal 10:10 p.m. EXAM: CT HEAD WITHOUT CONTRAST TECHNIQUE: Contiguous axial images were obtained from the base of the skull through the vertex without intravenous contrast. COMPARISON:  None. FINDINGS: Brain: There is no acute hemorrhage or other extra-axial collection. No midline shift or other mass effect. No hydrocephalus. There are old infarcts both frontal lobes, both parietal lobes, left occipital lobe and left cerebellum. There is no acute infarct visible. Vascular: No hyperdense vessel or unexpected calcification. Skull: Normal. Negative for fracture or focal lesion. Sinuses/Orbits: No acute finding. Other: None. ASPECTS [facility] Stroke Program Early CT Score) - Ganglionic level infarction (caudate, lentiform nuclei, internal capsule, insula, M1-M3 cortex): 7 - Supraganglionic  infarction (M4-M6 cortex): 3 Total score (0-10 with 10 being normal): 10 IMPRESSION: 1. No acute hemorrhage or mass effect. 2. Multiple old infarcts. 3. ASPECTS is 10. These results were called by telephone at the time of interpretation on 01/14/2020 at 11:49 pm to provider [facility] , who verbally acknowledged these results. Electronically Signed   By: Deatra Robinson M.D.   On: 01/14/2020 23:49    (Echo, Carotid, EGD, Colonoscopy, ERCP)    Subjective: Seen and examined the day of discharge.  Continues to endorse nausea.  No other issues.  Discharge Exam: Vitals:   01/19/20 0427 01/19/20 0741  BP:  130/70 121/82  Pulse: (!) 52 89  Resp: 18 20  Temp: 97.8 F (36.6 C) 98.2 F (36.8 C)  SpO2: 96% 96%   Vitals:   01/18/20 2219 01/19/20 0001 01/19/20 0427 01/19/20 0741  BP: 119/70 128/78 130/70 121/82  Pulse: 85 80 (!) 52 89  Resp: Temp: 98.6 F (37 C) 98.3 F (36.8 C) 97.8 F (36.6 C) 98.2 F (36.8 C)  TempSrc: Oral Oral Oral   SpO2: 95% 95% 96% 96%  Weight:      Height:        General: Pt is alert, awake, not in acute distress Cardiovascular: RRR, S1/S2 +, no rubs, no gallops Respiratory: CTA bilaterally, no wheezing, no rhonchi Abdominal: Soft, NT, ND, bowel sounds + Extremities: no edema, no cyanosis    The results of significant diagnostics from this hospitalization (including imaging, microbiology, ancillary and laboratory) are listed below for reference.     Microbiology: Recent Results (from the past 240 hour(s))  SARS Coronavirus 2 by RT PCR (hospital order, performed in [facility] hospital lab) Nasopharyngeal Nasopharyngeal Swab     Status: None   Collection Time: 01/15/20 12:34 AM   Specimen: Nasopharyngeal Swab  Result Value Ref Range Status   SARS Coronavirus 2 NEGATIVE NEGATIVE Final    Comment: (NOTE) SARS-CoV-2 target nucleic acids are NOT DETECTED.  The SARS-CoV-2 RNA is generally detectable in upper and lower respiratory specimens during the acute phase of infection. The lowest concentration of SARS-CoV-2 viral copies this assay can detect is 250 copies / mL. A negative result does not preclude SARS-CoV-2 infection and should not be used as the sole basis for treatment or other patient management decisions.  A negative result may occur with improper specimen collection / handling, submission of specimen other than nasopharyngeal swab, presence of viral mutation(s) within the areas targeted by this assay, and inadequate number of viral copies (<250 copies / mL). A negative result must be combined with clinical observations,  patient history, and epidemiological information.  Fact Sheet for Patients:   BoilerBrush.com.cy  Fact Sheet for Healthcare Providers: https://pope.com/  This test is not yet approved or  cleared by the [place] FDA and has been authorized for detection and/or diagnosis of SARS-CoV-2 by FDA under an Emergency Use Authorization (EUA).  This EUA will remain in effect (meaning this test can be used) for the duration of the COVID-19 declaration under Section 564(b)(1) of the Act, 21 U.S.C. section 360bbb-3(b)(1), unless the authorization is terminated or revoked sooner.  Performed at [facility], [address] [place], [place] [address]      Labs: BNP (last 3 results) No results for input(s): BNP in the last 8760 hours. Basic Metabolic Panel: Recent Labs  Lab 01/14/20 2324 01/15/20 0227 01/16/20 0502 01/17/20 0554  NA 142 142 139 140  K 3.4*  3.4* 3.7 3.6  CL 103 106 103 104  CO2 28 26 22 22   GLUCOSE 99 95 96 95  BUN 17 15 12 19   CREATININE 0.88 0.64 0.53 0.57  CALCIUM 9.1 8.5* 8.9 9.0   Liver Function Tests: Recent Labs  Lab 01/14/20 2324 01/15/20 0227 01/16/20 0502 01/17/20 0554  AST 17 14* 18 18  ALT 13 11 12 11   ALKPHOS 65 58 61 60  BILITOT 1.0 0.9 1.2 1.4*  PROT 6.7 6.1* 6.7 6.6  ALBUMIN 3.8 3.3* 3.7 3.6   No results for input(s): LIPASE, AMYLASE in the last 168 hours. No results for input(s): AMMONIA in the last 168 hours. CBC: Recent Labs  Lab 01/14/20 2324 01/15/20 0227 01/16/20 0502 01/17/20 0554  WBC 9.3 9.5 12.9* 11.2*  NEUTROABS 7.5  --   --   --   HGB 11.4* 10.9* 12.3 11.8*  HCT 35.6* 34.3* 38.0 35.9*  MCV 77.7* 77.6* 77.4* 74.3*  PLT 231 206 235 231   Cardiac Enzymes: No results for input(s): CKTOTAL, CKMB, CKMBINDEX, TROPONINI in the last 168 hours. BNP: Invalid input(s): POCBNP CBG: Recent Labs  Lab 01/14/20 2332  GLUCAP 76   D-Dimer No results for input(s):  DDIMER in the last 72 hours. Hgb A1c No results for input(s): HGBA1C in the last 72 hours. Lipid Profile No results for input(s): CHOL, HDL, LDLCALC, TRIG, CHOLHDL, LDLDIRECT in the last 72 hours. Thyroid function studies Recent Labs    01/17/20 0554 01/17/20 0751  TSH <0.010*  --   T3FREE  --  4.1   Anemia work up No results for input(s): VITAMINB12, FOLATE, FERRITIN, TIBC, IRON, RETICCTPCT in the last 72 hours. Urinalysis No results found for: COLORURINE, APPEARANCEUR, LABSPEC, PHURINE, GLUCOSEU, HGBUR, BILIRUBINUR, KETONESUR, PROTEINUR, UROBILINOGEN, NITRITE, LEUKOCYTESUR Sepsis Labs Invalid input(s): PROCALCITONIN,  WBC,  LACTICIDVEN Microbiology Recent Results (from the past 240 hour(s))  SARS Coronavirus 2 by RT PCR (hospital order, performed in [facility] hospital lab) Nasopharyngeal Nasopharyngeal Swab     Status: None   Collection Time: 01/15/20 12:34 AM   Specimen: Nasopharyngeal Swab  Result Value Ref Range Status   SARS Coronavirus 2 NEGATIVE NEGATIVE Final    Comment: (NOTE) SARS-CoV-2 target nucleic acids are NOT DETECTED.  The SARS-CoV-2 RNA is generally detectable in upper and lower respiratory specimens during the acute phase of infection. The lowest concentration of SARS-CoV-2 viral copies this assay can detect is 250 copies / mL. A negative result does not preclude SARS-CoV-2 infection and should not be used as the sole basis for treatment or other patient management decisions.  A negative result may occur with improper specimen collection / handling, submission of specimen other than nasopharyngeal swab, presence of viral mutation(s) within the areas targeted by this assay, and inadequate number of viral copies (<250 copies / mL). A negative result must be combined with clinical observations, patient history, and epidemiological information.  Fact Sheet for Patients:   03/18/20  Fact Sheet for Healthcare  Providers: [facility]  This test is not yet approved or  cleared by the 03/16/20 FDA and has been authorized for detection and/or diagnosis of SARS-CoV-2 by FDA under an Emergency Use Authorization (EUA).  This EUA will remain in effect (meaning this test can be used) for the duration of the COVID-19 declaration under Section 564(b)(1) of the Act, 21 U.S.C. section 360bbb-3(b)(1), unless the authorization is terminated or revoked sooner.  Performed at [facility], [address] [place], [facility] [address]  Time coordinating discharge: Over 30 minutes  SIGNED:   Tresa Moore, MD  [facility] 01/19/2020, 11:01 AM Pager   If 7PM-7AM, please contact night-coverage

## 2020-01-20 ENCOUNTER — Inpatient Hospital Stay (HOSPITAL_COMMUNITY): Payer: Medicare (Managed Care) | Admitting: Speech Pathology

## 2020-01-20 ENCOUNTER — Inpatient Hospital Stay (HOSPITAL_COMMUNITY): Payer: Medicare (Managed Care) | Admitting: Physical Therapy

## 2020-01-20 ENCOUNTER — Inpatient Hospital Stay (HOSPITAL_COMMUNITY): Payer: Medicare (Managed Care)

## 2020-01-20 DIAGNOSIS — D72829 Elevated white blood cell count, unspecified: Secondary | ICD-10-CM

## 2020-01-20 DIAGNOSIS — R1114 Bilious vomiting: Secondary | ICD-10-CM

## 2020-01-20 DIAGNOSIS — E785 Hyperlipidemia, unspecified: Secondary | ICD-10-CM

## 2020-01-20 DIAGNOSIS — G894 Chronic pain syndrome: Secondary | ICD-10-CM

## 2020-01-20 DIAGNOSIS — I4891 Unspecified atrial fibrillation: Secondary | ICD-10-CM

## 2020-01-20 MED ORDER — POLYETHYLENE GLYCOL 3350 17 G PO PACK
17.0000 g | PACK | Freq: Two times a day (BID) | ORAL | Status: DC
  Administered 2020-01-20 – 2020-01-25 (×8): 17 g via ORAL
  Filled 2020-01-20 (×22): qty 1

## 2020-01-20 NOTE — Progress Notes (Signed)
Pt refused ensure drink this evening, offered other flavors but pt refused. She was educated on importance of nutritional supplements. Pt only ate a couple bites of peaches. At 0625 pt vomited undigested peaches. [place] Reglan administered. At 0655 pt vomited again yellow liquid onto the floor. Pt is requesting Zofran, will administer. Report given to oncoming nurse.

## 2020-01-20 NOTE — Progress Notes (Signed)
Patient is requesting medication for seasonal allergies, states she would take benadryl over the counter daily. Writer said they would leave a note for the doctor to address in the morning.

## 2020-01-20 NOTE — Plan of Care (Signed)
  Problem: RH Balance Goal: LTG Patient will maintain dynamic sitting balance (PT) Description: LTG:  Patient will maintain dynamic sitting balance with assistance during mobility activities (PT) Flowsheets (Taken 01/20/2020 1142) LTG: Pt will maintain dynamic sitting balance during mobility activities with:: Independent with assistive device  Goal: LTG Patient will maintain dynamic standing balance (PT) Description: LTG:  Patient will maintain dynamic standing balance with assistance during mobility activities (PT) Flowsheets (Taken 01/20/2020 1142) LTG: Pt will maintain dynamic standing balance during mobility activities with:: Supervision/Verbal cueing   Problem: RH Bed Mobility Goal: LTG Patient will perform bed mobility with assist (PT) Description: LTG: Patient will perform bed mobility with assistance, with/without cues (PT). Flowsheets (Taken 01/20/2020 1142) LTG: Pt will perform bed mobility with assistance level of: Independent   Problem: RH Bed to Chair Transfers Goal: LTG Patient will perform bed/chair transfers w/assist (PT) Description: LTG: Patient will perform bed to chair transfers with assistance (PT). Flowsheets (Taken 01/20/2020 1142) LTG: Pt will perform Bed to Chair Transfers with assistance level: Supervision/Verbal cueing   Problem: RH Car Transfers Goal: LTG Patient will perform car transfers with assist (PT) Description: LTG: Patient will perform car transfers with assistance (PT). Flowsheets (Taken 01/20/2020 1142) LTG: Pt will perform car transfers with assist:: Supervision/Verbal cueing   Problem: RH Floor Transfers Goal: LTG Patient will perform floor transfers w/assist (PT) Description: LTG: Patient will perform floor transfers with assistance (PT). Flowsheets (Taken 01/20/2020 1142) LTG: PT WILL PERFORM FLOOR TRANFERS  WITH  ASSIST:: Moderate Assistance - Patient 50 - 74%   Problem: RH Ambulation Goal: LTG Patient will ambulate in controlled environment  (PT) Description: LTG: Patient will ambulate in a controlled environment, # of feet with assistance (PT). Flowsheets (Taken 01/20/2020 1142) LTG: Pt will ambulate in controlled environ  assist needed:: Supervision/Verbal cueing LTG: Ambulation distance in controlled environment: 182ft with LRAD Goal: LTG Patient will ambulate in home environment (PT) Description: LTG: Patient will ambulate in home environment, # of feet with assistance (PT). Flowsheets (Taken 01/20/2020 1142) LTG: Pt will ambulate in home environ  assist needed:: Supervision/Verbal cueing LTG: Ambulation distance in home environment: 70ft with LRAD   Problem: RH Stairs Goal: LTG Patient will ambulate up and down stairs w/assist (PT) Description: LTG: Patient will ambulate up and down # of stairs with assistance (PT) Flowsheets (Taken 01/20/2020 1142) LTG: Pt will ambulate up/down stairs assist needed:: Supervision/Verbal cueing LTG: Pt will  ambulate up and down number of stairs: 1 step to access home without rails

## 2020-01-20 NOTE — Progress Notes (Signed)
Patient is complaining of burning while urinating and feeling urgency, even when she doesn't have to go. She states she was told at [facility] that she had a "bladder infection" and that they were going to give her antibiotics for it. Writer said they would leave a note for the doctor to address in the morning.
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
## 2020-01-20 NOTE — Progress Notes (Signed)
[facility] PHYSICAL MEDICINE & REHABILITATION PROGRESS NOTE  Subjective/Complaints: Patient seen laying in bed this morning.  She states she slept well overnight, had some vomiting this morning.  Discussed vomiting with nursing as well.  Patient noted vomiting after taking meds on empty stomach.  She states she feels better now.  ROS: Denies CP, SOB, N/V/D at present  Objective: Vital Signs: Blood pressure 126/72, pulse 62, temperature 98 F (36.7 C), resp. rate 18, height 5\' 6"  (1.676 m), weight 55.3 kg, SpO2 98 %. No results found. No results for input(s): WBC, HGB, HCT, PLT in the last 72 hours. No results for input(s): NA, K, CL, CO2, GLUCOSE, BUN, CREATININE, CALCIUM in the last 72 hours.  Physical Exam: BP 126/72 (BP Location: Right Arm)   Pulse 62   Temp 98 F (36.7 C)   Resp 18   Ht 5\' 6"  (1.676 m)   Wt 55.3 kg   SpO2 98%   BMI 19.68 kg/m  Constitutional: No distress . Vital signs reviewed. HENT: Normocephalic.  Atraumatic. Eyes: EOMI. No discharge. Cardiovascular: No JVD.  Irregularly irregular. Respiratory: Normal effort.  No stridor.  Bilateral clear to auscultation. GI: Non-distended.  BS +. Skin: Warm and dry.  Intact. Psych: Normal mood.  Normal behavior. Musc: No edema in extremities.  No tenderness in extremities. Neuro: Alert and oriented x1 Expressive aphasia Motor: Grossly 4+-5/5 throughout, left upper extremity slightly weaker  Assessment/Plan: 1. 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  Physiatrist is providing close team supervision and 24 hour management of active medical problems listed below.  Physiatrist and rehab team continue to assess barriers to discharge/monitor patient progress toward functional and medical goals  Care Tool:  Bathing    Body parts bathed by patient: Right arm, Left  arm, Abdomen, Front perineal area, Chest, Right upper leg, Buttocks, Left upper leg, Right lower leg, Left lower leg, Face         Bathing assist Assist Level: Minimal Assistance - Patient > 75%     Upper Body Dressing/Undressing Upper body dressing   What is the patient wearing?: Bra, Pull over shirt    Upper body assist Assist Level: Minimal Assistance - Patient > 75%    Lower Body Dressing/Undressing Lower body dressing      What is the patient wearing?: Pants, Underwear/pull up     Lower body assist Assist for lower body dressing: Moderate Assistance - Patient 50 - 74%     Toileting Toileting    Toileting assist Assist for toileting: Minimal Assistance - Patient > 75%     Transfers Chair/bed transfer  Transfers assist     Chair/bed transfer assist level: Moderate Assistance - Patient 50 - 74%     Locomotion Ambulation   Ambulation assist      Assist level: Minimal Assistance - Patient > 75%   Max distance: 10ft   Walk 10 feet activity   Assist     Assist level: Minimal Assistance - Patient > 75% Assistive device: Hand held assist   Walk 50 feet activity   Assist    Assist level: Minimal Assistance - Patient > 75% Assistive device: Hand held assist    Walk 150 feet activity   Assist Walk 150 feet activity did not occur: Safety/medical concerns         Walk 10 feet on uneven surface  activity   Assist  Walk 10 feet on uneven surfaces activity did not occur: Safety/medical concerns         Wheelchair     Assist   Type of Wheelchair: Manual    Wheelchair assist level: Maximal Assistance - Patient 25 - 49% Max wheelchair distance: 50    Wheelchair 50 feet with 2 turns activity    Assist        Assist Level: Maximal Assistance - Patient 25 - 49%   Wheelchair 150 feet activity     Assist     Assist Level: Total Assistance - Patient < 25%      Medical Problem List and Plan: 1.  Decreased  functional mobility with bouts of nausea vomiting as well as aphasia secondary to acute subacute infarct left frontal operculum and insula as well as history of CVA  Begin CIR evaluations  2.  Antithrombotics: -DVT/anticoagulation with history of DVT: Pradaxa started 9/9.             -antiplatelet therapy: N/A 3. Pain Management: OxyContin 40 mg every 8 hours as needed ?PTA.     Monitor with increased exertion 4. Mood: Xanax 0.25 mg twice daily as needed             -antipsychotic agents: N/A 5. Neuropsych: This patient is capable of making decisions on her own behalf. 6. Skin/Wound Care: Routine skin checks 7. Fluids/Electrolytes/Nutrition: Routine in and outs  BMP within acceptable range on 9/8, labs ordered for Monday 8.  Atrial fibrillation.  Cardizem 300 mg daily as well as Toprol 100 mg daily.  Continue Pradaxa.    Rate controlled on 9/11 9.  Hyperlipidemia.    Lipitor 10.  Tobacco abuse/COPD.  NicoDerm patch.  Provide counseling 11.  Leukocytosis  WBCs 11.2 on 9/8, labs ordered for Monday  Afebrile 12.  Nausea  Encouraged to avoid medications on empty stomach  LOS: 1 days A FACE TO FACE EVALUATION WAS PERFORMED  Leilah Polimeni Karis Juba 01/20/2020, 2:22 PM
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
## 2020-01-21 MED ORDER — FLUTICASONE PROPIONATE 50 MCG/ACT NA SUSP
1.0000 | Freq: Every day | NASAL | Status: DC
  Administered 2020-01-21 – 2020-02-01 (×12): 1 via NASAL
  Filled 2020-01-21: qty 16

## 2020-01-21 NOTE — Progress Notes (Signed)
[place] PHYSICAL MEDICINE & REHABILITATION PROGRESS NOTE  Subjective/Complaints: Patient seen sitting up in bed this morning.  She states she did not sleep well overnight.  When asked about therapies yesterday, she states that she waited all day but no one came.  She asks if she will have therapies today.  Attempted to explain to her she had therapy yesterday, patient disbelief.  ROS: Limited, but appears to deny CP, SOB, N/V/D   Objective: Vital Signs: Blood pressure 124/84, pulse 75, temperature 98.1 F (36.7 C), resp. rate 18, height 5\' 6"  (1.676 m), weight 55.3 kg, SpO2 95 %. No results found. No results for input(s): WBC, HGB, HCT, PLT in the last 72 hours. No results for input(s): NA, K, CL, CO2, GLUCOSE, BUN, CREATININE, CALCIUM in the last 72 hours.  Physical Exam: BP 124/84 (BP Location: Left Arm)   Pulse 75   Temp 98.1 F (36.7 C)   Resp 18   Ht 5\' 6"  (1.676 m)   Wt 55.3 kg   SpO2 95%   BMI 19.68 kg/m   Constitutional: No distress . Vital signs reviewed. HENT: Normocephalic.  Atraumatic. Eyes: EOMI. No discharge. Cardiovascular: No JVD.  Irregularly irregular. Respiratory: Normal effort.  No stridor.  Bilateral clear to auscultation. GI: Non-distended.  BS +. Skin: Warm and dry.  Intact. Psych: Normal mood.  Normal behavior. Musc: No edema in extremities.  No tenderness in extremities. Neuro: Alert and oriented x1 Motor: Grossly 4+-5/5 throughout, left upper extremity slightly weaker, unchanged  Assessment/Plan: 1. 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  Physiatrist is providing close team supervision and 24 hour management of active medical problems listed below.  Physiatrist and rehab team continue to assess barriers to discharge/monitor patient progress toward functional and medical goals  Care  Tool:  Bathing    Body parts bathed by patient: Right arm, Left arm, Abdomen, Front perineal area, Chest, Right upper leg, Buttocks, Left upper leg, Right lower leg, Left lower leg, Face         Bathing assist Assist Level: Minimal Assistance - Patient > 75%     Upper Body Dressing/Undressing Upper body dressing   What is the patient wearing?: Bra, Pull over shirt    Upper body assist Assist Level: Minimal Assistance - Patient > 75%    Lower Body Dressing/Undressing Lower body dressing      What is the patient wearing?: Pants, Underwear/pull up     Lower body assist Assist for lower body dressing: Moderate Assistance - Patient 50 - 74%     Toileting Toileting    Toileting assist Assist for toileting: Minimal Assistance - Patient > 75%     Transfers Chair/bed transfer  Transfers assist     Chair/bed transfer assist level: Moderate Assistance - Patient 50 - 74%     Locomotion Ambulation   Ambulation assist      Assist level: Minimal Assistance - Patient > 75%   Max distance: 41ft   Walk 10 feet activity   Assist     Assist level: Minimal Assistance - Patient > 75% Assistive device: Hand held assist   Walk 50 feet activity   Assist    Assist level: Minimal Assistance - Patient > 75% Assistive device: Hand held assist    Walk 150 feet activity   Assist Walk 150 feet activity did not occur: Safety/medical concerns  Walk 10 feet on uneven surface  activity   Assist Walk 10 feet on uneven surfaces activity did not occur: Safety/medical concerns         Wheelchair     Assist   Type of Wheelchair: Manual    Wheelchair assist level: Maximal Assistance - Patient 25 - 49% Max wheelchair distance: 50    Wheelchair 50 feet with 2 turns activity    Assist        Assist Level: Maximal Assistance - Patient 25 - 49%   Wheelchair 150 feet activity     Assist     Assist Level: Total Assistance - Patient <  25%      Medical Problem List and Plan: 1.  Decreased functional mobility with bouts of nausea vomiting as well as aphasia secondary to acute subacute infarct left frontal operculum and insula as well as history of CVA  Continue CIR 2.  Antithrombotics: -DVT/anticoagulation with history of DVT: Pradaxa started 9/9.             -antiplatelet therapy: N/A 3. Pain Management: OxyContin 40 mg every 8 hours as needed ?PTA.    Will need to follow-up on home dosing, appears controlled on 9/12   Monitor with increased exertion 4. Mood: Xanax 0.25 mg twice daily as needed             -antipsychotic agents: N/A 5. Neuropsych: This patient is capable of making decisions on her own behalf.  Telesitter for safety 6. Skin/Wound Care: Routine skin checks 7. Fluids/Electrolytes/Nutrition: Routine in and outs  BMP within acceptable range on 9/8, labs ordered for tomorrow 8.  Atrial fibrillation.  Cardizem 300 mg daily as well as Toprol 100 mg daily.  Continue Pradaxa.    Rate controlled on 9/12 9.  Hyperlipidemia.    Lipitor 10.  Tobacco abuse/COPD.  NicoDerm patch.  Provide counseling 11.  Leukocytosis  WBCs 11.2 on 9/8, labs ordered for tomorrow  Afebrile 12.  Nausea-?  Resolved   Encouraged to avoid medications on empty stomach  LOS: 2 days A FACE TO FACE EVALUATION WAS PERFORMED  Nehal Shives Karis Juba 01/21/2020, 3:20 PM

## 2020-01-22 ENCOUNTER — Inpatient Hospital Stay (HOSPITAL_COMMUNITY): Payer: Medicare (Managed Care)

## 2020-01-22 ENCOUNTER — Inpatient Hospital Stay (HOSPITAL_COMMUNITY): Payer: Medicare (Managed Care) | Admitting: Speech Pathology

## 2020-01-22 ENCOUNTER — Inpatient Hospital Stay (HOSPITAL_COMMUNITY): Payer: Medicare (Managed Care) | Admitting: Occupational Therapy

## 2020-01-22 DIAGNOSIS — I4819 Other persistent atrial fibrillation: Secondary | ICD-10-CM

## 2020-01-22 DIAGNOSIS — E876 Hypokalemia: Secondary | ICD-10-CM

## 2020-01-22 DIAGNOSIS — D72823 Leukemoid reaction: Secondary | ICD-10-CM

## 2020-01-22 LAB — CBC WITH DIFFERENTIAL/PLATELET
Abs Immature Granulocytes: 0.03 10*3/uL (ref 0.00–0.07)
Basophils Absolute: 0.1 10*3/uL (ref 0.0–0.1)
Basophils Relative: 1 %
Eosinophils Absolute: 0.2 10*3/uL (ref 0.0–0.5)
Eosinophils Relative: 2 %
HCT: 39.7 % (ref 36.0–46.0)
Hemoglobin: 12.5 g/dL (ref 12.0–15.0)
Immature Granulocytes: 0 %
Lymphocytes Relative: 19 %
Lymphs Abs: 2 10*3/uL (ref 0.7–4.0)
MCH: 24.3 pg — ABNORMAL LOW (ref 26.0–34.0)
MCHC: 31.5 g/dL (ref 30.0–36.0)
MCV: 77.1 fL — ABNORMAL LOW (ref 80.0–100.0)
Monocytes Absolute: 1 10*3/uL (ref 0.1–1.0)
Monocytes Relative: 9 %
Neutro Abs: 7.3 10*3/uL (ref 1.7–7.7)
Neutrophils Relative %: 69 %
Platelets: 285 10*3/uL (ref 150–400)
RBC: 5.15 MIL/uL — ABNORMAL HIGH (ref 3.87–5.11)
RDW: 15.9 % — ABNORMAL HIGH (ref 11.5–15.5)
WBC: 10.6 10*3/uL — ABNORMAL HIGH (ref 4.0–10.5)
nRBC: 0 % (ref 0.0–0.2)

## 2020-01-22 LAB — COMPREHENSIVE METABOLIC PANEL
ALT: 13 U/L (ref 0–44)
AST: 16 U/L (ref 15–41)
Albumin: 3.5 g/dL (ref 3.5–5.0)
Alkaline Phosphatase: 57 U/L (ref 38–126)
Anion gap: 12 (ref 5–15)
BUN: 21 mg/dL (ref 8–23)
CO2: 25 mmol/L (ref 22–32)
Calcium: 8.8 mg/dL — ABNORMAL LOW (ref 8.9–10.3)
Chloride: 104 mmol/L (ref 98–111)
Creatinine, Ser: 0.79 mg/dL (ref 0.44–1.00)
GFR calc Af Amer: >60 mL/min (ref >60)
GFR calc non Af Amer: >60 mL/min (ref >60)
Glucose, Bld: 125 mg/dL — ABNORMAL HIGH (ref 70–99)
Potassium: 3.1 mmol/L — ABNORMAL LOW (ref 3.5–5.1)
Sodium: 141 mmol/L (ref 135–145)
Total Bilirubin: 0.7 mg/dL (ref 0.3–1.2)
Total Protein: 6.4 g/dL — ABNORMAL LOW (ref 6.5–8.1)

## 2020-01-22 MED ORDER — POTASSIUM CHLORIDE CRYS ER 20 MEQ PO TBCR
20.0000 meq | EXTENDED_RELEASE_TABLET | Freq: Two times a day (BID) | ORAL | Status: DC
  Administered 2020-01-22 – 2020-02-01 (×18): 20 meq via ORAL
  Filled 2020-01-22 (×22): qty 1

## 2020-01-22 MED ORDER — POTASSIUM CHLORIDE CRYS ER 20 MEQ PO TBCR
20.0000 meq | EXTENDED_RELEASE_TABLET | Freq: Two times a day (BID) | ORAL | Status: DC
  Administered 2020-01-22: 20 meq via ORAL
  Filled 2020-01-22: qty 1

## 2020-01-22 NOTE — Progress Notes (Signed)
[place] PHYSICAL MEDICINE & REHABILITATION PROGRESS NOTE  Subjective/Complaints: Up with PT in gym. Had no complaints today. Denies pain.   ROS: Patient denies fever, rash, sore throat, blurred vision, nausea, vomiting, diarrhea, cough, shortness of breath or chest pain, joint or back pain, headache, or mood change.    Objective: Vital Signs: Blood pressure (!) 106/58, pulse 93, temperature 98.1 F (36.7 C), resp. rate 18, height 5\' 6"  (1.676 m), weight 55.3 kg, SpO2 96 %. No results found. Recent Labs    01/22/20 0613  WBC 10.6*  HGB 12.5  HCT 39.7  PLT 285   Recent Labs    01/22/20 0613  NA 141  K 3.1*  CL 104  CO2 25  GLUCOSE 125*  BUN 21  CREATININE 0.79  CALCIUM 8.8*    Physical Exam: BP (!) 106/58 (BP Location: Right Arm)   Pulse 93   Temp 98.1 F (36.7 C)   Resp 18   Ht 5\' 6"  (1.676 m)   Wt 55.3 kg   SpO2 96%   BMI 19.68 kg/m   Constitutional: No distress . Vital signs reviewed. HEENT: EOMI, oral membranes moist Neck: supple Cardiovascular:IRR IRR without murmur. No JVD    Respiratory/Chest: CTA Bilaterally without wheezes or rales. Normal effort    GI/Abdomen: BS +, non-tender, non-distended Ext: no clubbing, cyanosis, or edema Psych: pleasant and cooperative Musc: No edema in extremities.  No tenderness in extremities. Neuro: Alert and oriented x2. Delays in processing Motor: Grossly 4+ to 5/5 throughout, left upper extremity slightly weaker, stable  Assessment/Plan: 1. 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  Physiatrist is providing close team supervision and 24 hour management of active medical problems listed below.  Physiatrist and rehab team continue to assess barriers to discharge/monitor patient progress toward functional and medical goals  Care Tool:  Bathing    Body parts  bathed by patient: Right arm, Left arm, Abdomen, Front perineal area, Chest, Right upper leg, Buttocks, Left upper leg, Right lower leg, Left lower leg, Face         Bathing assist Assist Level: Minimal Assistance - Patient > 75%     Upper Body Dressing/Undressing Upper body dressing   What is the patient wearing?: Bra, Pull over shirt    Upper body assist Assist Level: Minimal Assistance - Patient > 75%    Lower Body Dressing/Undressing Lower body dressing      What is the patient wearing?: Pants, Incontinence brief     Lower body assist Assist for lower body dressing: Moderate Assistance - Patient 50 - 74%     Toileting Toileting    Toileting assist Assist for toileting: Minimal Assistance - Patient > 75%     Transfers Chair/bed transfer  Transfers assist     Chair/bed transfer assist level: Moderate Assistance - Patient 50 - 74%     Locomotion Ambulation   Ambulation assist      Assist level: Minimal Assistance - Patient > 75%   Max distance: 22ft   Walk 10 feet activity   Assist     Assist level: Minimal Assistance - Patient > 75% Assistive device: Hand held assist   Walk 50 feet activity   Assist    Assist level: Minimal Assistance - Patient > 75% Assistive device: Hand held assist    Walk 150 feet activity   Assist Walk 150 feet activity did  not occur: Safety/medical concerns         Walk 10 feet on uneven surface  activity   Assist Walk 10 feet on uneven surfaces activity did not occur: Safety/medical concerns         Wheelchair     Assist Will patient use wheelchair at discharge?: No (No long term goals per PT) Type of Wheelchair: Manual    Wheelchair assist level: Maximal Assistance - Patient 25 - 49% Max wheelchair distance: 50    Wheelchair 50 feet with 2 turns activity    Assist        Assist Level: Maximal Assistance - Patient 25 - 49%   Wheelchair 150 feet activity     Assist      Assist Level: Total Assistance - Patient < 25%      Medical Problem List and Plan: 1.  Decreased functional mobility with bouts of nausea vomiting as well as aphasia secondary to acute subacute infarct left frontal operculum and insula as well as history of CVA  Continue CIR 2.  Antithrombotics: -DVT/anticoagulation with history of DVT: Pradaxa started 9/9.             -antiplatelet therapy: N/A 3. Pain Management: OxyContin 40 mg every 8 hours as needed ?PTA.    Will need to follow-up on home dosing, appears controlled on 9/13, using truly PRN so far     4. Mood: Xanax 0.25 mg twice daily as needed             -antipsychotic agents: N/A 5. Neuropsych: This patient is capable of making decisions on her own behalf.  Telesitter for safety 6. Skin/Wound Care: Routine skin checks 7. Fluids/Electrolytes/Nutrition: Routine in and outs  Hypokalemic on labs today 9/13   -start kdur bid 8.  Atrial fibrillation.  Cardizem 300 mg daily as well as Toprol 100 mg daily.  Continue Pradaxa.    Rate controlled on 9/13 9.  Hyperlipidemia.    Lipitor 10.  Tobacco abuse/COPD.  NicoDerm patch.  Provide counseling 11.  Leukocytosis  WBCs 11.2 on 9/8--> 10.6 9/13  Afebrile 12.  Nausea-?  Resolved    avoid medications on empty stomach  LOS: 3 days A FACE TO FACE EVALUATION WAS PERFORMED  Ranelle Oyster 01/22/2020, 12:58 PM

## 2020-01-22 NOTE — Progress Notes (Signed)
Pt given miralax in grape juice, she only took a few sips and stated she could not finish the rest. She was educated on importance of having a bowel movement. No other concerns to report.

## 2020-01-22 NOTE — Progress Notes (Signed)
Speech Language Pathology Daily Session Note  Patient Details  Name: Julie Harmon MRN: [id number] Date of Birth: 1955/09/17  Today's Date: 01/22/2020 SLP Individual Time: 3646-8032 SLP Individual Time Calculation (min): 59 min  Short Term Goals: Week 1: SLP Short Term Goal 1 (Week 1): Patient will orient to time, place, situation using visual aids with modA. SLP Short Term Goal 2 (Week 1): Patient will elaborate on statements/responses to open-ended hypothetical problem solving questions with min-modA. SLP Short Term Goal 3 (Week 1): Patient will demonstrate adequate safety awareness during performance of basic ADL's with modA SLP Short Term Goal 4 (Week 1): Patient will name at least 10 different objects when given a particular category and 2 minute time limit, with modA SLP Short Term Goal 5 (Week 1): Patient will demonstrate sustained attention during basic and mildly complex level problem solving tasks with min-mod A for redirection. SLP Short Term Goal 6 (Week 1): Patient will maintain topic during structured conversation tasks with modA cues to redirect.  Skilled Therapeutic Interventions: Pt was seen for skilled ST targeting cognitive-linguistic goals. SLP administered portions of MOCA version 7.1 as means of further diagnostic treatment of pt's cognition. Total A step by step directions and intermittent hand over hand assist required for clock drawing. Pt generated numbers 1-6 and 12 independently, but required trace assist for others on clock. Max-Total A required for delayed and working memory with both storage and retrieval deficits noted. Pt's language comprehension also believed to impact memory and functional problem solving. Her speech was tangential with some phonemic and semantic paraphasias noted in addition to off topic comments. Mod A verbal cues required for redirection to tasks. Pt expressed need to void during session, therefore assisted with safe mobility via RW to toilet. Pt  required Moderate verbal and visual cues for sequencing, safety awareness with walker, and Min A to reduce impulsivity. Pt returned to bed, left laying in it with alarm on and needs within reach. Continue per current plan of care.          Pain Pain Assessment Pain Scale: 0-10 Pain Score: 0-No pain  Therapy/Group: Individual Therapy  Julie Harmon 01/22/2020, 12:23 PM
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
## 2020-01-22 NOTE — Progress Notes (Signed)
Inpatient Rehabilitation Center Individual Statement of Services  Patient Name:  Julie Harmon  Date:  01/22/2020  Welcome to the Inpatient Rehabilitation Center.  Our goal is to provide you with an individualized program based on your diagnosis and situation, designed to meet your specific needs.  With this comprehensive rehabilitation program, you will be expected to participate in at least 3 hours of rehabilitation therapies Monday-Friday, with modified therapy programming on the weekends.  Your rehabilitation program will include the following services:  Physical Therapy (PT), Occupational Therapy (OT), Speech Therapy (ST), 24 hour per day rehabilitation nursing, Therapeutic Recreaction (TR), Neuropsychology, Care Coordinator, Rehabilitation Medicine, Nutrition Services, Pharmacy Services and Other  Weekly team conferences will be held on Wednesday to discuss your progress.  Your Inpatient Rehabilitation Care Coordinator will talk with you frequently to get your input and to update you on team discussions.  Team conferences with you and your family in attendance may also be held.  Expected length of stay: 10-14 Days  Overall anticipated outcome: Supervision  Depending on your progress and recovery, your program may change. Your Inpatient Rehabilitation Care Coordinator will coordinate services and will keep you informed of any changes. Your Inpatient Rehabilitation Care Coordinator's name and contact numbers are listed  below.  The following services may also be recommended but are not provided by the Inpatient Rehabilitation Center:    Home Health Rehabiltiation Services  Outpatient Rehabilitation Services    Arrangements will be made to provide these services after discharge if needed.  Arrangements include referral to agencies that provide these services.  Your insurance has been verified to be:  [employer] Your primary doctor is: Quitman Livings, MD   Pertinent information  will be shared with your doctor and your insurance company.  Inpatient Rehabilitation Care Coordinator:  Lavera Guise, [place] [phone] or [phone]  Information discussed with and copy given to patient by: Andria Rhein, 01/22/2020, 11:22 AM

## 2020-01-22 NOTE — IPOC Note (Signed)
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) Active Problems:   Bilious vomiting with nausea   Leukocytosis   Atrial fibrillation (HCC)   Chronic pain syndrome   Dyslipidemia     Functional Problem List: Nursing Bladder, Endurance, Medication Management, Safety  PT Balance, Behavior, Endurance, Motor, Perception, Safety  OT Balance, Safety, Behavior, Cognition, Endurance, Motor  SLP Cognition, Linguistic, Safety, Behavior  TR         Basic ADLs: OT Bathing, Dressing, Toileting     Advanced  ADLs: OT       Transfers: PT Bed Mobility, Bed to Chair, Car, [employer], Floor  OT Toilet, Tub/Shower     Locomotion: PT Ambulation, Psychologist, prison and probation services, Stairs     Additional Impairments: OT None  SLP Social Cognition   Social Interaction, Problem Solving, Memory, Attention, Awareness  TR      Anticipated Outcomes Item Anticipated Outcome  Self Feeding no goal set  Swallowing  mod I   Basic self-care  (S)  Toileting  (S)   Bathroom Transfers (S)  Bowel/Bladder  manage with mod I assist  Transfers  Supervision assist with LRAD  Locomotion  ambulatory with supervision assist and LRAD  Communication  supervisionA  Cognition  supervisionA  Pain  Pain at or below level 4  Safety/Judgment  maintain safety with cues/reminders   Therapy Plan: PT Intensity: Minimum of 1-2 x/day ,45 to 90 minutes PT Frequency: 5 out of 7 days PT Duration Estimated Length of Stay: 10-14 days OT Intensity: Minimum of 1-2 x/day, 45 to 90 minutes OT Frequency: 5 out of 7 days OT Duration/Estimated Length of Stay: 10-14 days SLP Intensity: Minumum of 1-2 x/day, 30 to 90 minutes SLP Frequency: 3 to 5 out of 7 days SLP Duration/Estimated Length of  Stay: 2 weeks   Due to the current state of emergency, patients may not be receiving their 3-hours of Medicare-mandated therapy.   Team Interventions: Nursing Interventions Patient/Family Education, Disease Management/Prevention, Medication Management, Discharge Planning  PT interventions Ambulation/gait training, Discharge planning, Functional mobility training, Psychosocial support, Therapeutic Activities, Visual/perceptual remediation/compensation, Wheelchair propulsion/positioning, Therapeutic Exercise, Neuromuscular re-education, Disease management/prevention, Warden/ranger, Cognitive remediation/compensation, DME/adaptive equipment instruction, Pain management, UE/LE Strength taining/ROM, Stair training, Patient/family education, Splinting/orthotics, UE/LE Coordination activities, Functional electrical stimulation, Community reintegration  OT Interventions Warden/ranger, Discharge planning, Self Care/advanced ADL retraining, Therapeutic Activities, Therapeutic Exercise, Patient/family education, Functional mobility training, Disease mangement/prevention, Cognitive remediation/compensation, UE/LE Strength taining/ROM, Community reintegration, Psychosocial support, Neuromuscular re-education, DME/adaptive equipment instruction, UE/LE Coordination activities  SLP Interventions Cognitive remediation/compensation, Speech/Language facilitation, Functional tasks, Cueing hierarchy, Therapeutic Activities, Internal/external aids, Patient/family education, Medication managment  TR Interventions    SW/CM Interventions Discharge Planning, Psychosocial Support, Patient/Family Education   Barriers to Discharge MD  Medical stability  Nursing      PT Decreased caregiver support, Insurance for SNF coverage, Behavior    OT      SLP Other (comments) N/A  SW       Team Discharge Planning: Destination: PT-Home ,OT- Home , SLP-  Projected Follow-up: PT-Home health PT, OT-  Home  health OT, SLP-  Projected Equipment Needs: PT-To be determined, OT- To be determined, SLP-  Equipment Details: PT- , OT-  Patient/family involved in discharge planning: PT- Patient,  OT-Patient, SLP-Patient unable/family or caregive not available  MD ELOS: 10-14  days Medical Rehab Prognosis:  Excellent Assessment: The patient has been admitted for CIR therapies with the diagnosis of CVA. The team will be addressing functional mobility, strength, stamina, balance, safety, adaptive techniques and equipment, self-care, bowel and bladder mgt, patient and caregiver education, NMR, cognition, community reentry. Goals have been set at supervision for mobility, self-care and cognition .   Due to the current state of emergency, patients may not be receiving their 3 hours per day of Medicare-mandated therapy.    Ranelle Oyster, MD, FAAPMR      See Team Conference Notes for weekly updates to the plan of care

## 2020-01-22 NOTE — Progress Notes (Signed)
Patient ID: Julie Harmon, female   DOB: 1956-02-20, 64 y.o.   MRN: [id number] Met with the patient to review role of the nurse CM and review secondary stroke prevention and risk factors including HTN, Afib and smoking. Patient reported nicoderm patch makes her feel sick and she prefers to go "cold [place]". So far that has not bothered her although she does persevorates on smoking at times. Reviewed  effects of smoking on health in general and treatment of stroke with a statin. Also reviewed Afib and medications. Patient reported she plans to go back to [place] and would not need a physician in town. Patient given handouts on DASH diet and coping with not smoking. Continue to follow along with the patient and address educational needs. Margarito Liner

## 2020-01-22 NOTE — Progress Notes (Signed)
Occupational Therapy Session Note  Patient Details  Name: Julie Harmon MRN: [id number] Date of Birth: May 21, 1955  Today's Date: 01/22/2020 OT Individual Time: 1300-1416 OT Individual Time Calculation (min): 76 min    Short Term Goals: Week 1:  OT Short Term Goal 1 (Week 1): Pt will don pants with min A OT Short Term Goal 2 (Week 1): Pt will complete toileting tasks with CGA OT Short Term Goal 3 (Week 1): Pt will complete walk in shower transfer with CGA  Skilled Therapeutic Interventions/Progress Updates:    Pt completed supine to sit EOB with supervision.  She was not oriented to place, stating she was not in [place], but couldn't figure out where.  She needed max demonstrational cueing for orientation of day of the week and month as well as year.  She also was not able to recall any of her therapies from earlier in the day when asked.  Once on the EOB she worked on donning and tying her shoes.  Mod instructional cueing for orientation as she tried to place the right foot in the left shoe and vice versa.  Therapist had to cue her and then she was able to correct it.  Increased time needed for tying both.  She next ambulated over to the sink for oral hygiene in standing with min assist.  She then ambulated down to the therapy gym with mod hand held assist.  Increased knee flexion noted bilaterally with posterior pelvic tilt and shuffling gait pattern.  She needed mod instructional cueing to take larger steps throughout mobility.  Once in the gym she sat on a therapy mat and worked on visual problem solving activity of sorting colored ping pong balls in a crate to match a given picture.  She needed max demonstrational cueing to complete, with pt on multiple occasions demonstrating decreased motor planning and awareness, trying to pick up the balls in the picture given instead of from the table.  Finished session with return to the room.  She was unable to recall her room number given to her five  mins earlier by therapist.  She was left in the bed with the call button and phone in reach and bed alarm in place.  Telesitter also in place as well.      Therapy Documentation Precautions:  Precautions Precautions: Fall Precaution Comments: shuffling gait, tremors Restrictions Weight Bearing Restrictions: No  Pain: Pain Assessment Pain Scale: Faces Pain Score: 0-No pain Faces Pain Scale: Hurts a little bit Pain Type: Acute pain Pain Location: Knee Pain Orientation: Right Pain Descriptors / Indicators: Discomfort Pain Onset: With Activity Pain Intervention(s): Repositioned;Emotional support ADL: See Care Tool Section for some details of mobility and selfcare  Therapy/Group: Individual Therapy  Conleigh Heinlein OTR/L 01/22/2020, 3:53 PM

## 2020-01-22 NOTE — Progress Notes (Signed)
Inpatient Rehabilitation  Patient information reviewed and entered into eRehab system by Jadasia Haws M. Konner Saiz, M.A., CCC/SLP, PPS Coordinator.  Information including medical coding, functional ability and quality indicators will be reviewed and updated through discharge.    

## 2020-01-22 NOTE — Progress Notes (Signed)
Calorie Count Note: Day 1 and Day 2 Results  48-hour calorie count ordered.  Calorie count began with lunch meal on 01/19/20 and ended after breakfast meal on 01/21/20.  Diet: Regular, thin liquids  Supplements: -Ensure Enlive poTIDbetween meals, each supplement provides 350 kcal and 20 grams of protein -Boost Breeze poBID with lunch and dinner meals, each supplement provides 250 kcal and 9 grams of protein  Day 1: 01/19/20 Lunch: 20 kcal, 0 grams of protein 01/19/20 Dinner: 10 kcal, 0 grams of protein 01/20/20 Breakfast: 190 kcal, 9 grams of protein Supplements: 350 kcal, 20 grams of protein  Day 1 total 24-hour intake: 570 kcal (35% of minimum estimated needs)  29 grams of protein (36% of minimum estimated needs)  Day 2: 01/20/20 Lunch: 375 kcal, 11 grams of protein 01/20/20 Dinner: 58 kcal, 0 grams of protein 01/21/20 Breakfast: 72 kcal, 2 grams of protein Supplements: 0 kcal, 0 grams of protein  Day 2 total 24-hour intake: 505 kcal (31% of minimum estimated needs)  13 grams of protein (16% of minimum estimated needs)  Nutrition Diagnosis: Severe Malnutrition related to acute illness (N/V/D, inadequate PO intake since CVA in June 2021) as evidenced by moderate fat depletion, severe muscle depletion, percent weight loss (18.7% weight loss in 3 months).  Goal: Patient will meet greater than or equal to 90% of their needs  Intervention: - d/c 48-hour calorie count - Continue oral nutrition supplements as listed above - Continue MVI with minerals daily - Recommend Cortrak NG tube placement and initiation of enteral nutrition, discussed with PA   Earma Reading, MS, RD, LDN Inpatient Clinical Dietitian Please see AMiON for contact information.

## 2020-01-22 NOTE — Progress Notes (Signed)
Physical Therapy Session Note  Patient Details  Name: Julie Harmon MRN: [id number] Date of Birth: 28-Mar-1956  Today's Date: 01/22/2020 PT Individual Time: 0830-0928 PT Individual Time Calculation (min): 58 min   Short Term Goals: Week 1:  PT Short Term Goal 1 (Week 1): Pt will ambulate >110ft with LRAD and CGA PT Short Term Goal 2 (Week 1): Pt will perform 5xSTS in less then 25 seconds to demonstrate improved balance and safety with funcitonal transfers PT Short Term Goal 3 (Week 1): Pt will propell WC 176ft with min assist PT Short Term Goal 4 (Week 1): Pt will trasnfer to [facility] from [facility] with  min assist consistently  Skilled Therapeutic Interventions/Progress Updates:    Patient received supine in bed, agreeable to PT. She denies pain at this time. Patient requesting to use restroom- MinA with HHA to ambulate to restroom with consistent B shuffling, R>L. Verbal cues needed for motor planning turn to sit on toilet. Once in front of toilet, patient requiring verbal reminders for what task she was attempting to complete. When first getting into bathroom, patient started looking up at the towels and asking PT how PT wanted patient to retrieve towels. PT reminding patient that she requested to use the bathroom. Patient demonstrating impulsive behavior, standing for pericare before PT was available to assist. MinA to ambulate to wc with HHA. SPV + verbal cues for donning sneakers while seated in wc. Patient agreeable to gait tx on treadmill with LiteGait for increased repetitions. Patient generally very anxious throughout PT session. Once on treadmill, visual and verbal cues provided to elicit longer stride length B with emphasis on R LE. Mirror provided for visual cuing for neutral trunk position (not posterior lean) and to reinforce longer stride length. When given dots to step over, patient able to produce longer step. Once this cue was removed to assess for carryover, patient immediately returned to  shuffling gait pattern. Patient ambulated for a total of 5:48mins at 0. (slow speed d/t anxiety) for a total of 145ft. Patient requesting seated rest break halfway through ambulating- PT provided chair on stopped treadmill to allow patient to sit. After partially sitting, patient stating she "didn't know how to sit anymore." Tactile and verbal cuing required for patient to sit in chair. After ambulating again, PT had patient ambulate backwards off of treadmill with LiteGait to return to chair. With ~1 foot left on treadmill track, patient stating "I think that's as far as I can go, I'm done now." Patient with profound difficulty motor planning and follow verbal cuing to finish stepping off of treadmill. Throughout session, patient asking PT if the "man behind her" is also exercising- there was no man present behind her. Patient appearing to be hallucinating and perseverating on "man exercising behind her." Patient returned to room in wc, seatbelt alarm in place, call light within reach.   Therapy Documentation Precautions:  Precautions Precautions: Fall Precaution Comments: Cognitive deficits Restrictions Weight Bearing Restrictions: No    Therapy/Group: Individual Therapy  Elizebeth Koller, PT, DPT, CBIS 01/22/2020, 7:46 AM

## 2020-01-23 ENCOUNTER — Inpatient Hospital Stay (HOSPITAL_COMMUNITY): Payer: Medicare (Managed Care) | Admitting: Physical Therapy

## 2020-01-23 ENCOUNTER — Inpatient Hospital Stay (HOSPITAL_COMMUNITY): Payer: Medicare (Managed Care) | Admitting: Occupational Therapy

## 2020-01-23 ENCOUNTER — Inpatient Hospital Stay (HOSPITAL_COMMUNITY): Payer: Medicare (Managed Care)

## 2020-01-23 ENCOUNTER — Inpatient Hospital Stay (HOSPITAL_COMMUNITY): Payer: Medicare (Managed Care) | Admitting: Speech Pathology

## 2020-01-23 DIAGNOSIS — I639 Cerebral infarction, unspecified: Secondary | ICD-10-CM

## 2020-01-23 MED ORDER — SORBITOL 70 % SOLN: 15.0000 mL | Freq: Every day | Status: DC | PRN

## 2020-01-23 MED ORDER — OXYCODONE HCL ER 15 MG PO T12A
40.0000 mg | EXTENDED_RELEASE_TABLET | Freq: Two times a day (BID) | ORAL | Status: DC | PRN
  Administered 2020-01-23: 40 mg via ORAL
  Filled 2020-01-23: qty 1

## 2020-01-23 NOTE — Progress Notes (Signed)
[place] PHYSICAL MEDICINE & REHABILITATION PROGRESS NOTE  Subjective/Complaints:  Pt oriented to person and place but not time , discussed OxyCR, his listed as TID prn, pt states she never took more than BID at home, has not taken every day in hospital   Pt with 5 packs of peanut butter crackers on tray table, states she eats 3 pack a day (540cal)  ROS:- limited by cognition, states everything is "ok" become irritable with further quesioning Objective: Vital Signs: Blood pressure 106/75, pulse (!) 51, temperature 98.1 F (36.7 C), resp. rate 17, height 5\' 6"  (1.676 m), weight 55.3 kg, SpO2 96 %. No results found. Recent Labs    01/22/20 0613  WBC 10.6*  HGB 12.5  HCT 39.7  PLT 285   Recent Labs    01/22/20 0613  NA 141  K 3.1*  CL 104  CO2 25  GLUCOSE 125*  BUN 21  CREATININE 0.79  CALCIUM 8.8*    Physical Exam: BP 106/75 (BP Location: Right Arm)   Pulse (!) 51   Temp 98.1 F (36.7 C)   Resp 17   Ht 5\' 6"  (1.676 m)   Wt 55.3 kg   SpO2 96%   BMI 19.68 kg/m   Constitutional: No distress . Vital signs reviewed. HEENT: EOMI, oral membranes moist Neck: supple Cardiovascular:IRR IRR without murmur. No JVD    Respiratory/Chest: CTA Bilaterally without wheezes or rales. Normal effort    GI/Abdomen: BS +, non-tender, non-distended Ext: no clubbing, cyanosis, or edema Psych: pleasant and cooperative Musc: No edema in extremities.  No tenderness in extremities. Neuro: Alert and oriented x2. Delays in processing Motor: Grossly 4+ to 5/5 throughout, left upper extremity slightly weaker, stable  Assessment/Plan: 1. 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  Physiatrist is providing close team supervision and 24 hour management of active medical problems listed below.  Physiatrist and rehab team continue to assess  barriers to discharge/monitor patient progress toward functional and medical goals  Care Tool:  Bathing    Body parts bathed by patient: Right arm, Left arm, Abdomen, Front perineal area, Chest, Right upper leg, Buttocks, Left upper leg, Right lower leg, Left lower leg, Face         Bathing assist Assist Level: Minimal Assistance - Patient > 75%     Upper Body Dressing/Undressing Upper body dressing   What is the patient wearing?: Bra, Pull over shirt    Upper body assist Assist Level: Minimal Assistance - Patient > 75%    Lower Body Dressing/Undressing Lower body dressing      What is the patient wearing?: Pants, Incontinence brief     Lower body assist Assist for lower body dressing: Moderate Assistance - Patient 50 - 74%     Toileting Toileting    Toileting assist Assist for toileting: Minimal Assistance - Patient > 75%     Transfers Chair/bed transfer  Transfers assist     Chair/bed transfer assist level: Moderate Assistance - Patient 50 - 74%     Locomotion Ambulation   Ambulation assist      Assist level: Moderate Assistance - Patient 50 - 74% Assistive device: Hand held assist Max distance: 150'   Walk 10 feet activity   Assist     Assist level: Minimal Assistance - Patient > 75% Assistive device: Hand held assist   Walk 50 feet activity   Assist  Assist level: Minimal Assistance - Patient > 75% Assistive device: Hand held assist    Walk 150 feet activity   Assist Walk 150 feet activity did not occur: Safety/medical concerns         Walk 10 feet on uneven surface  activity   Assist Walk 10 feet on uneven surfaces activity did not occur: Safety/medical concerns         Wheelchair     Assist Will patient use wheelchair at discharge?: No (No long term goals per PT) Type of Wheelchair: Manual    Wheelchair assist level: Maximal Assistance - Patient 25 - 49% Max wheelchair distance: 50    Wheelchair 50 feet  with 2 turns activity    Assist        Assist Level: Maximal Assistance - Patient 25 - 49%   Wheelchair 150 feet activity     Assist     Assist Level: Total Assistance - Patient < 25%      Medical Problem List and Plan: 1.  Decreased functional mobility with bouts of nausea vomiting as well as aphasia secondary to acute subacute infarct left frontal operculum and insula as well as history of CVA  Continue CIR 2.  Antithrombotics: -DVT/anticoagulation with history of DVT: Pradaxa started 9/9.             -antiplatelet therapy: N/A 3. Pain Management: OxyContin 40 mg every 8 hours change to q 12 h as needed ?PTA.    Will need to follow-up on home dosing, appears controlled on 9/13, using truly PRN so far     4. Mood: Xanax 0.25 mg twice daily as needed             -antipsychotic agents: N/A 5. Neuropsych: This patient is capable of making decisions on her own behalf.  Telesitter for safety 6. Skin/Wound Care: Routine skin checks 7. Fluids/Electrolytes/Nutrition: Routine in and outs  Hypokalemic on labs today 9/13   -start kdur bid- recheck K+ in am  8.  Atrial fibrillation.  Cardizem 300 mg daily as well as Toprol 100 mg daily.  Continue Pradaxa.    Rate controlled on 9/13 9.  Hyperlipidemia.    Lipitor 10.  Tobacco abuse/COPD.  NicoDerm patch.  Provide counseling 11.  Leukocytosis  WBCs 11.2 on 9/8--> 10.6 9/13  Afebrile 12.  Nausea-?  Resolved    avoid medications on empty stomach 13.  Poor intake but has >500 unrecorded cal per day, monitor weekly weight 14.  Constipation , check KUB as discussed with LPN may give sorbitol LOS: 4 days A FACE TO FACE EVALUATION WAS PERFORMED  Erick Colace 01/23/2020, 8:23 AM

## 2020-01-23 NOTE — Progress Notes (Signed)
Speech Language Pathology Daily Session Note  Patient Details  Name: Julie Harmon MRN: [id number] Date of Birth: 10-20-1955  Today's Date: 01/23/2020 SLP Individual Time: 4142-3953 SLP Individual Time Calculation (min): 55 min  Short Term Goals: Week 1: SLP Short Term Goal 1 (Week 1): Patient will orient to time, place, situation using visual aids with modA. SLP Short Term Goal 2 (Week 1): Patient will elaborate on statements/responses to open-ended hypothetical problem solving questions with min-modA. SLP Short Term Goal 3 (Week 1): Patient will demonstrate adequate safety awareness during performance of basic ADL's with modA SLP Short Term Goal 4 (Week 1): Patient will name at least 10 different objects when given a particular category and 2 minute time limit, with modA SLP Short Term Goal 5 (Week 1): Patient will demonstrate sustained attention during basic and mildly complex level problem solving tasks with min-mod A for redirection. SLP Short Term Goal 6 (Week 1): Patient will maintain topic during structured conversation tasks with modA cues to redirect.  Skilled Therapeutic Interventions: Pt was seen for skilled ST targeting cognitive-linguistic goals. Pt continues to be very tangential with her speech/communication and requires Min  A verbal cues for use of word-finding strategies. She is unaware of word substitutions (ex: calling her contacts a drivers license). She also intermittently becomes verbally agitated and Moderate verbal cues required for sustained attention to tasks an to redirect from off topic conversations/comments.  Pt expressed need to void during session, requiring Mod cues for safety awareness with use of rolling walker and sustained attention to task. She was oriented to hospital but not date. Max A required for recall and Mod A for problem solving use of telephone and TV function on call bell. She did recall how to use call bell to request assistance from RN. Of note, pt  may benefit from neuropsych consult, as in addition to attention deficits, some of her comments are concerning for paranoia. SLP contacted SW regarding neuropsych consult. Pt left laying in bed with alarm set and needs within reach. Continue per current plan of care.          Pain Pain Assessment Pain Scale: 0-10 Pain Score: 0-No pain  Therapy/Group: Individual Therapy  Little Ishikawa 01/23/2020, 12:33 PM

## 2020-01-23 NOTE — Progress Notes (Signed)
Physical Therapy Session Note  Patient Details  Name: Julie Harmon MRN: [id number] Date of Birth: 12-Jul-1955  Today's Date: 01/23/2020 PT Individual Time: 1415-1445 PT Individual Time Calculation (min): 30 min   and  Today's Date: 01/23/2020 PT Missed Time: 15 Minutes Missed Time Reason: Patient fatigue  Short Term Goals: Week 1:  PT Short Term Goal 1 (Week 1): Pt will ambulate >152ft with LRAD and CGA PT Short Term Goal 2 (Week 1): Pt will perform 5xSTS in less then 25 seconds to demonstrate improved balance and safety with funcitonal transfers PT Short Term Goal 3 (Week 1): Pt will propell WC 182ft with min assist PT Short Term Goal 4 (Week 1): Pt will trasnfer to and from [facility] with  min assist consistently  Skilled Therapeutic Interventions/Progress Updates:   Pt received supine in bed, HOB elevated, having just finished lunch and watching TV. Upon encouraging pt to participate in therapy session pt replies "no, not right now" stating she is tired and needs to rest. Therapist further attempted to encourage patient but she continues to defer requesting to rest at this time. Therapist will return at a later time as schedule permits. Missed 15 minutes of skilled physical therapy.  Therapist returned and pt supine in bed reporting need to use bathroom and agreeable for therapist assistance to increase independence. Supine>sitting L EOB, HOB partially elevated, with supervision. Pt initially stating "I can't stand up" requiring cuing to scoot closer to EOB for improved motor planning. Sit>stand, no AD, with min assist for balance. Gait ~61ft x2 to/from bathroom with R HHA and min assist for balance. Standing LB clothing management with CGA for steadying balance. Continent of bladder and despite therapist requesting pt to notify her prior to standing pt suddenly stood up using grab bar with distant supervision - reiterated importance of having staff assist with standing. Standing peri-care, LB  clothing management, and hand hygiene with CGA for steadying balance. With moderate encouragement pt agreeable to continue therapy session.  Transported to/from gym in w/c for time management and energy conservation. Gait training ~494ft x2 with L HHA transitioned to R HHA due to pt demoing R posterior lean with min assist for balance - with cuing demos improved B LE step lengths and increased gait speed - 1x when pt getting close to w/c demoed festination of LEs requiring cuing to help restart stepping pattern. Repeated sit<>stands without UE support x6 reps with pt reporting significant difficulty with this task and min assist for lifting/lowering. Dynamic standing balance via alternate B LE foot taps to cones with R HHA and min assist for balance - attempted to perform 3 cone tap but pt with difficulty understanding task and when to use each LE. Transported back to room and pt requesting to return to bed. Stand pivot, no AD, with CGA for steadying. Sit>supine, HOB partially elevated and using bedrail, with supervision. Pt left supine in bed with needs in reach and bed alarm on.  Therapy Documentation Precautions:  Precautions Precautions: Fall Precaution Comments: shuffling gait, tremors Restrictions Weight Bearing Restrictions: No  Pain:   No reports of pain throughout session.   Therapy/Group: Individual Therapy  Ginny Forth , PT, DPT, CSRS  01/23/2020, 12:56 PM

## 2020-01-23 NOTE — Progress Notes (Signed)
Occupational Therapy Session Note  Patient Details  Name: Julie Harmon MRN: [id number] Date of Birth: April 07, 1956  Today's Date: 01/23/2020 OT Individual Time: 1962-2297 OT Individual Time Calculation (min): 31 min    Short Term Goals: Week 1:  OT Short Term Goal 1 (Week 1): Pt will don pants with min A OT Short Term Goal 2 (Week 1): Pt will complete toileting tasks with CGA OT Short Term Goal 3 (Week 1): Pt will complete walk in shower transfer with CGA  Skilled Therapeutic Interventions/Progress Updates:    Pt completed supine to sit EOB with supervision to start session.  She then worked on donning her shoes with increased time and setup.  She was  oriented to place and month, but not day of the week.  She was able to ambulate to the dayroom with mod hand held assist.  Noted slight improvement with step length this session.  Once in the dayroom, had her sit on the therapy mat and work on simple four piece foam block puzzle.  She had to place four blocks in the same configuration of a diagram on the page.  She needed 4-5 attempts to configure it in order to match the diagram given.  When it wasn't correct, she needed mod questioning cueing to identify the errors.  Finished session with ambulation back to the room at mod assist and pt transferring into the bed with supervision.  Call button and phone in reach with bed alarm in place.    Therapy Documentation Precautions:  Precautions Precautions: Fall Precaution Comments: shuffling gait, tremors Restrictions Weight Bearing Restrictions: No  Pain: Pain Assessment Pain Scale: Faces Pain Score: 0-No pain ADL: See Care Tool Section for some details of mobility and selfcare  Therapy/Group: Individual Therapy  Tion Tse OTR/L 01/23/2020, 3:49 PM

## 2020-01-23 NOTE — Progress Notes (Signed)
Occupational Therapy Session Note  Patient Details  Name: Julie Harmon MRN: [id number] Date of Birth: 09-Feb-1956  Today's Date: 01/23/2020 OT Individual Time: 7096-4383 OT Individual Time Calculation (min): 75 min    Short Term Goals: Week 1:  OT Short Term Goal 1 (Week 1): Pt will don pants with min A OT Short Term Goal 2 (Week 1): Pt will complete toileting tasks with CGA OT Short Term Goal 3 (Week 1): Pt will complete walk in shower transfer with CGA  Skilled Therapeutic Interventions/Progress Updates:    Pt received supine with no c/o pain, agreeable to take shower. Pt completed bed mobility with (S). Pt not oriented to date, month, or condition, but to place and year. Pt completed ambulatory transfer into the bathroom with min HHA. Pt required min cueing for sequencing removal of clothing safely. Pt completed UB bathing with set up assist, and CGA for LB. Min A to wash hair. Pt transferred to her w/c at the sink. She donned shirt and bra with min A only to fasten bra posteriorly. Pt donned pants with CGA. Pt completed oral care in standing with close (S). Max A to provide hair care, including blow drying to give pt a rest break. Pt was taken to the therapy gym via w/c. She completed 2x 10 dynamic step ups onto 5 in step with min A- CGA provided for balance stabilization. Pt then completed blocked practice eccentric lowering squats to focus on improved strength/control when lowering to surfaces to sit. 3x 10 repetitions performed with mod cueing for technique. Rest breaks provided between each set. Pt was returned to the w/c and to the room. Pt was left sitting in the w.c with all needs met, chair alarm set.   Therapy Documentation Precautions:  Precautions Precautions: Fall Precaution Comments: shuffling gait, tremors Restrictions Weight Bearing Restrictions: No  Therapy/Group: Individual Therapy  [name] 01/23/2020, 8:00 AM

## 2020-01-24 ENCOUNTER — Inpatient Hospital Stay (HOSPITAL_COMMUNITY): Payer: Medicare (Managed Care) | Admitting: Occupational Therapy

## 2020-01-24 ENCOUNTER — Inpatient Hospital Stay (HOSPITAL_COMMUNITY): Payer: Medicare (Managed Care)

## 2020-01-24 ENCOUNTER — Inpatient Hospital Stay (HOSPITAL_COMMUNITY): Payer: Medicare (Managed Care) | Admitting: Speech Pathology

## 2020-01-24 DIAGNOSIS — I639 Cerebral infarction, unspecified: Secondary | ICD-10-CM

## 2020-01-24 MED ORDER — OXYCODONE HCL ER 10 MG PO T12A
20.0000 mg | EXTENDED_RELEASE_TABLET | Freq: Two times a day (BID) | ORAL | Status: DC | PRN
  Administered 2020-01-24: 20 mg via ORAL
  Filled 2020-01-24: qty 2

## 2020-01-24 NOTE — Progress Notes (Signed)
Patient ID: Julie Harmon, female   DOB: 1956-03-27, 64 y.o.   MRN: [id number]  Team Conference Report to Patient/Family  Team Conference discussion was reviewed with the patient and caregiver, including goals, any changes in plan of care and target discharge date.  Patient and caregiver express understanding and are in agreement.  The patient has a target discharge date of 02/02/20.  Andria Rhein 01/24/2020, 1:24 PM

## 2020-01-24 NOTE — Patient Care Conference (Signed)
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 Claudette Laws Care Coodinator Present: Lavera Guise, BSW;Camrin Lapre Lambert Mody, RN, BSN, CRRN Nurse Present: Margot Ables, LPN PT Present: Other (comment) Veneta Penton, Ginette Pitman, PT) OT Present: Perrin Maltese, OT SLP Present: Suzzette Righter, CF-SLP PPS Coordinator present : Fae Pippin, SLP     Current Status/Progress Goal Weekly Team Focus  Bowel/Bladder   pt cont of bowel and bladder. pt has feeling to void but sometimes does not void. lbm 01/23/20   remain cont.  Assess q shift and orn    Swallow/Nutrition/ Hydration             ADL's   Min A UB dressing, CGA for LB ADLS, ADL transfers with min HHA. Oriented x1  Supervision overall  Cognition, ADL retraining, ADL transfers, d/c planning   Mobility   CGA bed mobility, min/mod assist sit<>stand and stand pivot transfers using HHA, mod assist gait up to 152ft with R HHA, mod assist 1 step  supervision overall  activity tolerance, pt education, B LE strengthening, gait training, dynamic standing balance   Communication   Min A word finding  Supervision  word finding strategies, awareness of verbal errors    Safety/Cognition/ Behavioral Observations  Mod A basic  Supervision  basic familiar problem solving, emergent awareness, selective attention, orientation and recall with aids   Pain   pt  complains of pain in knee 6-8/10  decrease pain   Assess pain q shift and prn    Skin   no current skin breakdown   remain free of breakdown   Assess skin q shift and prn      Discharge Planning:  Patient goal to discharge back home ([place]) or back to sister's home Julie Harmon).   Team Discussion: Left frontal infarct however patient reports more on chronic back pain issues. Continent of bowel and bladder. Note confusion and poor orientation, poor motor planning and shuffled gaid with flexed knees when ambulating.  Patient on target to meet rehab goals: yes  *See Care Plan and progress notes for long and short-term goals.   Revisions to Treatment Plan:   Teaching Needs: Transfers, step maneuvering toileting and medications, etc.  Current Barriers to Discharge: Decreased caregiver support and Home enviroment access/layout  Possible Resolutions to Barriers: Daughter unable to come into facility due to health issues and will take patient back home to [place] at discharge.  Send home with educational information.     Medical Summary Current Status: intake improving , constipation  Barriers to Discharge: Other (comments)  Barriers to Discharge Comments: pt refusing bowel program Possible Resolutions to [employer] Focus: work to address constipaton through dietary means, establish d/c environment   Continued Need for Acute  Rehabilitation Level of Care: The patient requires daily medical management by a physician with specialized training in physical medicine and rehabilitation for the following reasons: Direction of a multidisciplinary physical rehabilitation program to maximize functional independence : Yes Medical management of patient stability for increased activity during  participation in an intensive rehabilitation regime.: Yes Analysis of laboratory values and/or radiology reports with any subsequent need for medication adjustment and/or medical intervention. : Yes   I attest that I was present, lead the team conference, and concur with the assessment and plan of the team.   Chana Bode B 01/24/2020, 3:36 PM

## 2020-01-24 NOTE — Progress Notes (Signed)
Occupational Therapy Session Note  Patient Details  Name: Julie Harmon MRN: [id number] Date of Birth: 04-07-1956  Today's Date: 01/24/2020 OT Individual Time: 0109-3235 OT Individual Time Calculation (min): 56 min    Short Term Goals: Week 1:  OT Short Term Goal 1 (Week 1): Pt will don pants with min A OT Short Term Goal 2 (Week 1): Pt will complete toileting tasks with CGA OT Short Term Goal 3 (Week 1): Pt will complete walk in shower transfer with CGA  Skilled Therapeutic Interventions/Progress Updates:    1:1. Pt received in bed agreeable to OT after finishing breakfast with no pain reported. Pt requires A for orientation to day and month knows year. Pt completes mobilty into/out of hsower with min HHA and VC for not reaching out to furniture walk and longer step length. Pt bathes with CGA at sit to stand level, dresses EOB with CGA  for UB to hook 1 loop of bra in front with poor awareness/adjustment of hands on bra to ease hooking and CGA for LB with hand palcement cues. Grooming completed at standing level at sink with CGA and VC for not propping hips on sink to challenge balance. Exited session with pt seated in bed, exit alarm on and call light inr each  Therapy Documentation Precautions:  Precautions Precautions: Fall Precaution Comments: shuffling gait, tremors Restrictions Weight Bearing Restrictions: No General:   Vital Signs: Therapy Vitals Temp: 98.3 F (36.8 C) Pulse Rate: (!) 51 BP: 119/66 Patient Position (if appropriate): Lying Oxygen Therapy SpO2: 94 % O2 Device: Room Air Pain:   ADL: ADL Eating: Set up Where Assessed-Eating: Bed level Grooming: Minimal assistance Where Assessed-Grooming: Edge of bed Upper Body Bathing: Supervision/safety Where Assessed-Upper Body Bathing: Shower Lower Body Bathing: Minimal assistance Where Assessed-Lower Body Bathing: Shower Upper Body Dressing: Minimal assistance Where Assessed-Upper Body Dressing: Edge of  bed Lower Body Dressing: Moderate assistance Where Assessed-Lower Body Dressing: Edge of bed Toileting: Minimal assistance Where Assessed-Toileting: Teacher, adult education: Moderate assistance Toilet Transfer Method: Proofreader: Acupuncturist: Moderate assistance Film/video editor Method: Designer, industrial/product: Grab bars, Shower seat with back Clinical research associate   Exercises:   Other Treatments:     Therapy/Group: Individual Therapy  Shon Hale 01/24/2020, 7:02 AM

## 2020-01-24 NOTE — Progress Notes (Addendum)
Speech Language Pathology Daily Session Note  Patient Details  Name: Julie Harmon MRN: [id number] Date of Birth: Apr 11, 1956  Today's Date: 01/24/2020 SLP Individual Time: 1497-0263 SLP Individual Time Calculation (min): 27 min  Short Term Goals: Week 1: SLP Short Term Goal 1 (Week 1): Patient will orient to time, place, situation using visual aids with modA. SLP Short Term Goal 2 (Week 1): Patient will elaborate on statements/responses to open-ended hypothetical problem solving questions with min-modA. SLP Short Term Goal 3 (Week 1): Patient will demonstrate adequate safety awareness during performance of basic ADL's with modA SLP Short Term Goal 4 (Week 1): Patient will name at least 10 different objects when given a particular category and 2 minute time limit, with modA SLP Short Term Goal 5 (Week 1): Patient will demonstrate sustained attention during basic and mildly complex level problem solving tasks with min-mod A for redirection. SLP Short Term Goal 6 (Week 1): Patient will maintain topic during structured conversation tasks with modA cues to redirect.  Skilled Therapeutic Interventions: Pt was seen for skilled ST targeting speech goals. SLP facilitated session with conversation and naming tasks targeting word finding strategies. Pt required Min-Mod A verbal cues to narrow down a category in order to generate names of 10 items that fit within the category in 1/3 attempts with a 2 minute time constraint (other attempts produced 9 and 7). Pt required increased Mod-Max A verbal cues for context and more concrete language during mildly complex convergent naming tasks. Pt initially required Mod A to maintain attention to task and topics of conversation, faded to Min. Pt left laying in bed with alarm set and needs within reach. Continue per current plan of care.          Pain Pain Assessment Pain Scale: 0-10 Pain Score: 0-No pain  Therapy/Group: Individual Therapy  Little Ishikawa 01/24/2020, 7:24 AM

## 2020-01-24 NOTE — Progress Notes (Signed)
Occupational Therapy Session Note  Patient Details  Name: Julie Harmon MRN: [id number] Date of Birth: 01/27/1956  Today's Date: 01/24/2020 OT Individual Time: 1300-1404 OT Individual Time Calculation (min): 64 min    Short Term Goals: Week 1:  OT Short Term Goal 1 (Week 1): Pt will don pants with min A OT Short Term Goal 2 (Week 1): Pt will complete toileting tasks with CGA OT Short Term Goal 3 (Week 1): Pt will complete walk in shower transfer with CGA  Skilled Therapeutic Interventions/Progress Updates:    Pt in bed to start session trying to use the phone.  She stated she was trying to call her daughter and asked therapist if he could assist her.  Therapist asked her the number and she wasn't able to state it but instead said it was on the phone.  Therapist re-directed pt that her daughter's number was not on the phone as this was the hospital phone.  She adamantly declined to believe this and stated "that blond girl told me it was on there".  Therapist continued to try and re-direct her eventually calling in the nurse, so that she would understand that the number was not on the phone.  Nursing came in and re-directed pt as well and agreed to have pt's daughter call her on the phone.  When she called, pt was very rude to her daughter stating she was leaving today and not staying another night.  Her daughter tried to re-direct her as well but when pt became more rude, the daughter just stated she was not talking any more with pt hanging up the phone and cursing.  After sitting for a minute or so and therapist continuing to help pt understand her deficits from this CVA, she was agreeable to walk out of the room.  Min assist for functional mobility without an assistive device.  She continues to walk with shuffling gait as well as well as flexed knees and posterior pelvic tilt.  She ambulated to the dayroom where she sat at the high/low table to work on a 28 piece puzzle.  Therapist provided max  assist and gave pt up to 3-4 pieces at a time, asking her to figure out which piece fit in the appropriate spot.  She still needed overall max assist for orientation of pieces most of the time.  Finished session with pt returning to her room with min assist for mobility.  She was able to recall her room number after 10 minute delay, which was an improvement from previous session.  She was left in the bed with the call button and phone in reach and safety alarm and tele-sitter in place.    Therapy Documentation Precautions:  Precautions Precautions: Fall Precaution Comments: shuffling gait, tremors Restrictions Weight Bearing Restrictions: No  Pain: Pain Assessment Pain Scale: 0-10 Pain Score: 0-No pain ADL: See Care Tool Section for some details of mobility and selfcare  Therapy/Group: Individual Therapy  Shaune Malacara OTR/L 01/24/2020, 3:46 PM

## 2020-01-24 NOTE — Progress Notes (Signed)
Physical Therapy Session Note  Patient Details  Name: Julie Harmon MRN: [id number] Date of Birth: 1955-10-12  Today's Date: 01/24/2020 PT Individual Time: 0900-0958 PT Individual Time Calculation (min): 58 min   Short Term Goals: Week 1:  PT Short Term Goal 1 (Week 1): Pt will ambulate >138ft with LRAD and CGA PT Short Term Goal 2 (Week 1): Pt will perform 5xSTS in less then 25 seconds to demonstrate improved balance and safety with funcitonal transfers PT Short Term Goal 3 (Week 1): Pt will propell WC 191ft with min assist PT Short Term Goal 4 (Week 1): Pt will trasnfer to and from [facility] with  min assist consistently  Skilled Therapeutic Interventions/Progress Updates:    Patient received supine in bed agreeable to PT. She denies pain at this time. Patient able to transfer to wc with CGA for stand pivot and verbal cues for sequencing. PT propelled patient in wc to gym for time management. Follow NMR completed in // bars: Toe taps on 6" box B LE with B UE support on bars + 2# AW 3x20, step ups onto 6" box B LE with B UE support on bars + 2# AW 2x20, reciprocal LE/UE big stepping to targets 2x30. Patient completing NMR to encourage larger steps and full weight shift + adequate foot clearance. Patient ambulating 133ft with FWW with theraband tied to front for visual marker for larger steps. CGA provided. Shuffling gait pattern maintained with festinating steps and freezing of gait at turns, especially when turning to sit in wc. Patient ambulating 164ft with 2# AW B LE to encourage larger steps and HHA. Patient receptive to counting "1,2" for steps to encourage larger steps. Good carryover to not wearing AWs and ambulating with FWW + CGA. She continues to have festinating gait with turns despite interventions. Patient returning to room and requesting to use restroom. CGA provided. Patient returning to bed, bed alarm on, call light within reach.   Therapy Documentation Precautions:   Precautions Precautions: Fall Precaution Comments: shuffling gait, tremors Restrictions Weight Bearing Restrictions: No    Therapy/Group: Individual Therapy  Elizebeth Koller, PT, DPT, CBIS 01/24/2020, 7:43 AM

## 2020-01-24 NOTE — Progress Notes (Signed)
[place] PHYSICAL MEDICINE & REHABILITATION PROGRESS NOTE  Subjective/Complaints:  .No issues overnite . Pt had small BM yesterray no abd pain, intake much improved.   Plans to return to daughter's home  ROS:- limited by cognition,         Objective: Vital Signs: Blood pressure 119/66, pulse (!) 51, temperature 98.3 F (36.8 C), resp. rate 14, height 5\' 6"  (1.676 m), weight 55.3 kg, SpO2 94 %. No results found. Recent Labs    01/22/20 0613  WBC 10.6*  HGB 12.5  HCT 39.7  PLT 285   Recent Labs    01/22/20 0613  NA 141  K 3.1*  CL 104  CO2 25  GLUCOSE 125*  BUN 21  CREATININE 0.79  CALCIUM 8.8*    Physical Exam: BP 119/66 (BP Location: Left Arm)   Pulse (!) 51   Temp 98.3 F (36.8 C)   Resp 14   Ht 5\' 6"  (1.676 m)   Wt 55.3 kg   SpO2 94%   BMI 19.68 kg/m    General: No acute distress Mood and affect are appropriate Heart: Regular rate and rhythm no rubs murmurs or extra sounds Lungs: Clear to auscultation, breathing unlabored, no rales or wheezes Abdomen: Positive bowel sounds, soft nontender to palpation, nondistended Extremities: No clubbing, cyanosis, or edema Skin: No evidence of breakdown, no evidence of rash   Motor: Grossly 4+ to 5/5 throughout, left upper extremity slightly weaker, stable  Assessment/Plan: 1. 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  Physiatrist is providing close team supervision and 24 hour management of active medical problems listed below.  Physiatrist and rehab team continue to assess barriers to discharge/monitor patient progress toward functional and medical goals  Care Tool:  Bathing    Body parts bathed by patient: Right arm, Left arm, Abdomen, Front perineal area, Chest, Right upper leg, Buttocks, Left upper leg, Right lower leg, Left lower leg, Face          Bathing assist Assist Level: Contact Guard/Touching assist     Upper Body Dressing/Undressing Upper body dressing   What is the patient wearing?: Bra, Pull over shirt    Upper body assist Assist Level: Contact Guard/Touching assist    Lower Body Dressing/Undressing Lower body dressing      What is the patient wearing?: Pants, Underwear/pull up     Lower body assist Assist for lower body dressing: Contact Guard/Touching assist     Toileting Toileting    Toileting assist Assist for toileting: Contact Guard/Touching assist     Transfers Chair/bed transfer  Transfers assist     Chair/bed transfer assist level: Minimal Assistance - Patient > 75%     Locomotion Ambulation   Ambulation assist      Assist level: Minimal Assistance - Patient > 75% Assistive device: Hand held assist Max distance: 434ft   Walk 10 feet activity   Assist     Assist level: Minimal Assistance - Patient > 75% Assistive device: Hand held assist   Walk 50 feet activity   Assist    Assist level: Minimal Assistance - Patient > 75% Assistive device: Hand held assist    Walk 150 feet activity   Assist Walk 150 feet activity did not occur: Safety/medical concerns  Assist level: Minimal Assistance - Patient > 75% Assistive device: Hand held assist    Walk 10 feet on uneven surface  activity   Assist Walk 10 feet on uneven surfaces activity did not occur: Safety/medical concerns         Wheelchair     Assist Will patient use wheelchair at discharge?: No (No long term goals per PT) Type of Wheelchair: Manual    Wheelchair assist level: Maximal Assistance - Patient 25 - 49% Max wheelchair distance: 50    Wheelchair 50 feet with 2 turns activity    Assist        Assist Level: Maximal Assistance - Patient 25 - 49%   Wheelchair 150 feet activity     Assist     Assist Level: Total Assistance - Patient < 25%      Medical Problem List and  Plan: 1.  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 SLP  2.  Antithrombotics: -DVT/anticoagulation with history of DVT: Pradaxa started 9/9.             -antiplatelet therapy: N/A 3. Pain Management: OxyContin 40 mg every 8 hours change to q 12 h as needed ?PTA.    Will need to follow-up on home dosing, appears controlled on 9/13, using truly PRN so far     4. Mood: Xanax 0.25 mg twice daily as needed             -antipsychotic agents: N/A 5. Neuropsych: This patient is capable of making decisions on her own behalf.  Telesitter for safety 6. Skin/Wound Care: Routine skin checks 7. Fluids/Electrolytes/Nutrition: Routine in and outs  Hypokalemic on labs today 9/13   -start kdur bid- recheck K+ in am  8.  Atrial fibrillation.  Cardizem 300 mg daily as well as Toprol 100 mg daily.  Continue Pradaxa.    Rate controlled on 9/13 Vitals:   01/23/20 1943 01/24/20 0314  BP: 107/76 119/66  Pulse: (!) 106 (!) 51  Resp:    Temp: 98 F (36.7 C) 98.3 F (36.8 C)  SpO2: 95% 94%   9.  Hyperlipidemia.    Lipitor 10.  Tobacco abuse/COPD.  NicoDerm patch.  Provide counseling 11.  Leukocytosis  WBCs 11.2 on 9/8--> 10.6 9/13  Afebrile 12.  Nausea-?  Resolved    avoid medications on empty stomach 13.  Poor intake but has >500 unrecorded cal per day, monitor weekly weight 14.  Constipation ,small mushy BM yesterday - refused KUB, no signs of obstruction  LOS: 5 days A FACE TO FACE EVALUATION WAS PERFORMED  Erick Colace 01/24/2020, 8:09 AM

## 2020-01-24 NOTE — Progress Notes (Signed)
Patient ID: Julie Harmon, female   DOB: 11/25/1955, 64 y.o.   MRN: [id number]   Patient referral sent to [facility] for consideration, will update with decision.  [place], [place] [phone]

## 2020-01-25 ENCOUNTER — Inpatient Hospital Stay (HOSPITAL_COMMUNITY): Payer: Medicare Other | Admitting: Speech Pathology

## 2020-01-25 ENCOUNTER — Inpatient Hospital Stay (HOSPITAL_COMMUNITY): Payer: Medicare Other | Admitting: Physical Therapy

## 2020-01-25 ENCOUNTER — Inpatient Hospital Stay (HOSPITAL_COMMUNITY): Payer: Medicare Other | Admitting: Occupational Therapy

## 2020-01-25 ENCOUNTER — Inpatient Hospital Stay (HOSPITAL_COMMUNITY): Payer: Medicare Other

## 2020-01-25 DIAGNOSIS — I639 Cerebral infarction, unspecified: Secondary | ICD-10-CM

## 2020-01-25 MED ORDER — ENSURE ENLIVE PO LIQD
237.0000 mL | Freq: Two times a day (BID) | ORAL | Status: DC
  Administered 2020-01-27 – 2020-01-28 (×4): 237 mL via ORAL

## 2020-01-25 MED ORDER — OXYCODONE HCL 5 MG PO TABS: 20.0000 mg | ORAL_TABLET | Freq: Four times a day (QID) | ORAL | Status: DC | PRN

## 2020-01-25 MED ORDER — METOPROLOL SUCCINATE ER 50 MG PO TB24
50.0000 mg | ORAL_TABLET | Freq: Every day | ORAL | Status: DC
  Administered 2020-01-26 – 2020-02-01 (×7): 50 mg via ORAL
  Filled 2020-01-25 (×7): qty 1

## 2020-01-25 NOTE — Progress Notes (Signed)
Patient ID: Julie Harmon, female   DOB: 1955/11/02, 64 y.o.   MRN: [id number]   Due to patient discharging back to sister's home initially, patient has been set up with  PCP in [place], [place]. Patient will be seeing Philbert Riser, FNP-C at [facility]. First appointment scheduled on: November 15th at 10:00 AM.   Lavera Guise, [place] [phone]

## 2020-01-25 NOTE — NC FL2 (Signed)
[place] MEDICAID FL2 LEVEL OF CARE SCREENING TOOL     IDENTIFICATION  Patient Name: Julie Harmon Birthdate: 1955-11-22 Sex: female Admission Date (Current Location): 01/19/2020  [facility] and [place] Number:  Chiropodist and Address:  [facility] [place], [address], [place], [place] [address]      Provider Number:    Attending Physician Name and Address:  Erick Colace, MD  Relative Name and Phone Number:  Eunice Blase (sister):[phone]    Current Level of Care: Hospital Recommended Level of Care: Skilled Nursing Facility Prior Approval Number:    Date Approved/Denied:   PASRR Number:    Discharge Plan: SNF    Current Diagnoses: Patient Active Problem List   Diagnosis Date Noted  . Bilious vomiting with nausea   . Leukocytosis   . Atrial fibrillation (HCC)   . Chronic pain syndrome   . Dyslipidemia   . Protein-calorie malnutrition, severe 01/19/2020  . 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 Expressive aphasia 01/15/2020    Orientation RESPIRATION BLADDER Height & Weight     Self, Time, Situation  Normal Continent Weight: 121 lb 14.6 oz (55.3 kg) Height:  5\' 6"  (167.6 cm)  BEHAVIORAL SYMPTOMS/MOOD NEUROLOGICAL BOWEL NUTRITION STATUS      Continent Diet  AMBULATORY STATUS COMMUNICATION OF NEEDS Skin   Independent Verbally Normal                       Personal Care Assistance Level of Assistance  Bathing, Dressing Bathing Assistance: Independent   Dressing Assistance: Independent     Functional Limitations Info             SPECIAL CARE FACTORS FREQUENCY  PT (By licensed PT), OT (By licensed OT), Speech therapy     PT Frequency: 5x a week OT Frequency: 5x a week     Speech Therapy Frequency: 5x a week      Contractures Contractures Info: Not present    Additional Factors Info  Code Status Code Status Info: FULL             Current Medications  (01/25/2020):  This is the current hospital active medication list Current Facility-Administered Medications  Medication Dose Route Frequency Provider Last Rate Last Admin  . acetaminophen (TYLENOL) tablet 650 mg  650 mg Oral Q4H PRN Angiulli, 01/27/2020, PA-C       Or  . acetaminophen (TYLENOL) 160 MG/5ML solution 650 mg  650 mg Per Tube Q4H PRN Angiulli, Mcarthur Rossetti, PA-C       Or  . acetaminophen (TYLENOL) suppository 650 mg  650 mg Rectal Q4H PRN Angiulli, Mcarthur Rossetti, PA-C      . ALPRAZolam Mcarthur Rossetti) tablet 0.25 mg  0.25 mg Oral BID PRN Prudy Feeler, PA-C   0.25 mg at 01/24/20 1754  . atorvastatin (LIPITOR) tablet 40 mg  40 mg Oral Daily 01/26/20, PA-C   40 mg at 01/25/20 01/27/20  . dabigatran (PRADAXA) capsule 150 mg  150 mg Oral Q12H [id number], PA-C   150 mg at 01/25/20 0753  . diltiazem (CARDIZEM CD) 24 hr capsule 300 mg  300 mg Oral Daily 01/27/20, PA-C   300 mg at 01/25/20 0753  . feeding supplement (ENSURE ENLIVE) (ENSURE ENLIVE) liquid 237 mL  237 mL Oral BID BM Kirsteins, 01/27/20, MD      . fluticasone (FLONASE) 50 MCG/ACT nasal spray 1 spray  1  spray Each Nare Daily Marcello Fennel, MD   1 spray at 01/25/20 0754  . loperamide (IMODIUM) capsule 2 mg  2 mg Oral PRN Angiulli, Mcarthur Rossetti, PA-C      . metoCLOPramide (REGLAN) tablet 10 mg  10 mg Oral TID AC AngiulliMcarthur Rossetti, PA-C   10 mg at 01/25/20 0612  . [START ON 01/26/2020] metoprolol succinate (TOPROL-XL) 24 hr tablet 50 mg  50 mg Oral Daily Kirsteins, Victorino Sparrow, MD      . multivitamin with minerals tablet 1 tablet  1 tablet Oral Daily Charlton Amor, PA-C   1 tablet at 01/25/20 5643  . nicotine (NICODERM CQ - dosed in mg/24 hours) patch 14 mg  14 mg Transdermal Daily Angiulli, Mcarthur Rossetti, PA-C      . ondansetron [facility]) tablet 4 mg  4 mg Oral Q8H PRN Charlton Amor, PA-C   4 mg at 01/20/20 0758  . oxyCODONE (Oxy IR/ROXICODONE) immediate release tablet 20 mg  20 mg Oral Q6H PRN Kirsteins, Victorino Sparrow, MD       . polyethylene glycol (MIRALAX / GLYCOLAX) packet 17 g  17 g Oral BID Marcello Fennel, MD   17 g at 01/23/20 2029  . potassium chloride SA (KLOR-CON) CR tablet 20 mEq  20 mEq Oral BID Ranelle Oyster, MD   20 mEq at 01/25/20 0753  . sorbitol 70 % solution 15 mL  15 mL Oral Daily PRN Kirsteins, Victorino Sparrow, MD         Discharge Medications: Please see discharge summary for a list of discharge medications.  Relevant Imaging Results:  Relevant Lab Results:   Additional Information    Andria Rhein

## 2020-01-25 NOTE — Progress Notes (Signed)
[place] PHYSICAL MEDICINE & REHABILITATION PROGRESS NOTE  Subjective/Complaints:  Pt usually wears contacts but has them in [place].  Daughter supposed to mail them  ROS:- limited by cognition,         Objective: Vital Signs: Blood pressure 115/83, pulse 60, temperature (!) 97.4 F (36.3 C), temperature source Oral, resp. rate 16, height 5\' 6"  (1.676 m), weight 55.3 kg, SpO2 94 %. No results found. No results for input(s): WBC, HGB, HCT, PLT in the last 72 hours. No results for input(s): NA, K, CL, CO2, GLUCOSE, BUN, CREATININE, CALCIUM in the last 72 hours.  Physical Exam: BP 115/83 (BP Location: Left Arm)   Pulse 60   Temp (!) 97.4 F (36.3 C) (Oral)   Resp 16   Ht 5\' 6"  (1.676 m)   Wt 55.3 kg   SpO2 94%   BMI 19.68 kg/m    General: No acute distress Mood and affect are appropriate Heart: Regular rate and rhythm no rubs murmurs or extra sounds Lungs: Clear to auscultation, breathing unlabored, no rales or wheezes Abdomen: Positive bowel sounds, soft nontender to palpation, nondistended Extremities: No clubbing, cyanosis, or edema Skin: No evidence of breakdown, no evidence of rash   Motor: Grossly 4+ to 5/5 throughout, left upper extremity slightly weaker, stable Sensation reported as equal BUE  Assessment/Plan: 1. 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  Physiatrist is providing close team supervision and 24 hour management of active medical problems listed below.  Physiatrist and rehab team continue to assess barriers to discharge/monitor patient progress toward functional and medical goals  Care Tool:  Bathing    Body parts bathed by patient: Right arm, Left arm, Abdomen, Front perineal area, Chest, Right upper leg, Buttocks, Left upper leg, Right lower leg, Left lower leg, Face         Bathing assist Assist  Level: Contact Guard/Touching assist     Upper Body Dressing/Undressing Upper body dressing   What is the patient wearing?: Bra, Pull over shirt    Upper body assist Assist Level: Contact Guard/Touching assist    Lower Body Dressing/Undressing Lower body dressing      What is the patient wearing?: Pants, Underwear/pull up     Lower body assist Assist for lower body dressing: Contact Guard/Touching assist     Toileting Toileting    Toileting assist Assist for toileting: Contact Guard/Touching assist     Transfers Chair/bed transfer  Transfers assist     Chair/bed transfer assist level: Contact Guard/Touching assist     Locomotion Ambulation   Ambulation assist      Assist level: Minimal Assistance - Patient > 75% Assistive device: Hand held assist Max distance: 150   Walk 10 feet activity   Assist     Assist level: Contact Guard/Touching assist Assistive device: Hand held assist   Walk 50 feet activity   Assist    Assist level: Contact Guard/Touching assist Assistive device: Hand held assist    Walk 150 feet activity   Assist Walk 150 feet activity did not occur: Safety/medical concerns  Assist level: Contact Guard/Touching assist Assistive device: Walker-rolling    Walk 10 feet on uneven surface  activity   Assist Walk 10 feet on uneven surfaces activity did not occur: Safety/medical concerns         Wheelchair     Assist Will patient use wheelchair at discharge?: No (  No long term goals per PT) Type of Wheelchair: Manual    Wheelchair assist level: Maximal Assistance - Patient 25 - 49% Max wheelchair distance: 50    Wheelchair 50 feet with 2 turns activity    Assist        Assist Level: Maximal Assistance - Patient 25 - 49%   Wheelchair 150 feet activity     Assist     Assist Level: Total Assistance - Patient < 25%      Medical Problem List and Plan: 1.  Decreased functional mobility with bouts  of nausea vomiting as well as aphasia secondary to acute subacute infarct left frontal operculum and insula as well as history of CVA  Continue CIR PT, OT. SLP  2.  Antithrombotics: -DVT/anticoagulation with history of DVT: Pradaxa started 9/9.             -antiplatelet therapy: N/A 3. Pain Management: OxyContin 20 mg every 12 hours (reportedly on OxyCR 40mg  q8  as needed ?PTA.  )- did ok with 20mg  dose, will try Oxy IR 20mg  q6 prn given prn use (not every day) - has confusion trying to minimize meds that can affect cognition        4. Mood: Xanax 0.25 mg twice daily as needed             -antipsychotic agents: N/A 5. Neuropsych: This patient is capable of making decisions on her own behalf.  Telesitter for safety 6. Skin/Wound Care: Routine skin checks 7. Fluids/Electrolytes/Nutrition: Routine in and outs  Hypokalemic on labs today 9/13   -start kdur bid- recheck K+ in am  8.  Atrial fibrillation.  Cardizem 300 mg daily as well as Toprol 100 mg daily.  Continue Pradaxa.    Some bradycardic episodes may  Vitals:   01/24/20 1944 01/25/20 0428  BP: 121/73 115/83  Pulse: (!) 46 60  Resp: 18 16  Temp: 97.8 F (36.6 C) (!) 97.4 F (36.3 C)  SpO2: 97% 94%   9.  Hyperlipidemia.    Lipitor 10.  Tobacco abuse/COPD.  NicoDerm patch.  Provide counseling 11.  Leukocytosis  WBCs 11.2 on 9/8--> 10.6 9/13  Afebrile 12.  Nausea-?  Resolved    avoid medications on empty stomach 13.  Poor intake but has >500 unrecorded cal per day, monitor weekly weight 14.  Constipation ,now with daily BMs       LOS: 6 days A FACE TO FACE EVALUATION WAS PERFORMED  01/26/20 01/25/2020, 8:01 AM

## 2020-01-25 NOTE — Progress Notes (Signed)
Occupational Therapy Session Note  Patient Details  Name: Julie Harmon MRN: [id number] Date of Birth: 24-Jan-1956  Today's Date: 01/25/2020 OT Individual Time: 2725-3664 OT Individual Time Calculation (min): 44 min    Short Term Goals: Week 1:  OT Short Term Goal 1 (Week 1): Pt will don pants with min A OT Short Term Goal 2 (Week 1): Pt will complete toileting tasks with CGA OT Short Term Goal 3 (Week 1): Pt will complete walk in shower transfer with CGA  Skilled Therapeutic Interventions/Progress Updates:     1:1. Pt received in bed agreeable to OT with no pain and fatigue from not sleeping. Pt requires VC for external aides for orientation. Pt completes mobilty into/out of hsower with CGA using RW and MAX VC for management of RW during turns and inreased step length. Pt bathes with S at seated level using cut out on BSC to wash buttocks, dresses EOB with S for UB and bra hooked donned lieke a sports bra and CGA for LB with hand palcement cues. Grooming completed at standing level at sink with CGA and VC for not propping hips on sink to challenge balance. Of note pt reporting sizziness/room spinning in standing req seated rest. BP assessed as pt reports resolves in bed. 140/73 in supine. NT alerted.  Exited session with pt seated in bed, exit alarm on and call light inr each  Therapy Documentation Precautions:  Precautions Precautions: Fall Precaution Comments: shuffling gait, tremors Restrictions Weight Bearing Restrictions: No General:   Vital Signs: Therapy Vitals Temp: (!) 97.4 F (36.3 C) Temp Source: Oral Pulse Rate: 60 Resp: 16 BP: 115/83 Patient Position (if appropriate): Lying Oxygen Therapy SpO2: 94 % O2 Device: Room Air Pain:   ADL: ADL Eating: Set up Where Assessed-Eating: Bed level Grooming: Minimal assistance Where Assessed-Grooming: Edge of bed Upper Body Bathing: Supervision/safety Where Assessed-Upper Body Bathing: Shower Lower Body Bathing: Minimal  assistance Where Assessed-Lower Body Bathing: Shower Upper Body Dressing: Minimal assistance Where Assessed-Upper Body Dressing: Edge of bed Lower Body Dressing: Moderate assistance Where Assessed-Lower Body Dressing: Edge of bed Toileting: Minimal assistance Where Assessed-Toileting: Teacher, adult education: Moderate assistance Toilet Transfer Method: Proofreader: Acupuncturist: Moderate assistance Film/video editor Method: Designer, industrial/product: Grab bars, Shower seat with back Clinical research associate   Exercises:   Other Treatments:     Therapy/Group: Individual Therapy  Shon Hale 01/25/2020, 7:30 AM

## 2020-01-25 NOTE — Progress Notes (Signed)
Speech Language Pathology Daily Session Note  Patient Details  Name: Julie Harmon MRN: [id number] Date of Birth: [birth year]  Today's Date: 01/25/2020 SLP Individual Time: 1002-1059 SLP Individual Time Calculation (min): 57 min  Short Term Goals: Week 1: SLP Short Term Goal 1 (Week 1): Patient will orient to time, place, situation using visual aids with modA. SLP Short Term Goal 2 (Week 1): Patient will elaborate on statements/responses to open-ended hypothetical problem solving questions with min-modA. SLP Short Term Goal 3 (Week 1): Patient will demonstrate adequate safety awareness during performance of basic ADL's with modA SLP Short Term Goal 4 (Week 1): Patient will name at least 10 different objects when given a particular category and 2 minute time limit, with modA SLP Short Term Goal 5 (Week 1): Patient will demonstrate sustained attention during basic and mildly complex level problem solving tasks with min-mod A for redirection. SLP Short Term Goal 6 (Week 1): Patient will maintain topic during structured conversation tasks with modA cues to redirect.  Skilled Therapeutic Interventions: Pt was seen for skilled ST targeting cognitive goals. SLP facilitated session with basic money and medication management tasks from the ALFA. Moderate verbal and visual cues for problem solving, error awareness, and recall within task. Min A verbal cues provided for redirection to tasks throughout session. Pt required Max A for awareness of level of difficult these tasks presented her in functional conversation at end of session, as she stated it was "easy". She was receptive to [employer] feedback about level of assistance needed and encouragement to think about how she may need help with tasks like this, that she wouldn't have prior to acute CVA. Pt left laying in bed with alarm set and needs within reach. Continue per current plan of care.          Pain Pain Assessment Pain Scale: Faces Faces Pain  Scale: No hurt  Therapy/Group: Individual Therapy  Little Ishikawa 01/25/2020, 12:11 PM

## 2020-01-25 NOTE — Progress Notes (Signed)
Occupational Therapy Session Note  Patient Details  Name: Julie Harmon MRN: [id number] Date of Birth: Jan 25, 1956  Today's Date: 01/25/2020 OT Individual Time: 3419-3790 OT Individual Time Calculation (min): 34 min    Short Term Goals: Week 1:  OT Short Term Goal 1 (Week 1): Pt will don pants with min A OT Short Term Goal 2 (Week 1): Pt will complete toileting tasks with CGA OT Short Term Goal 3 (Week 1): Pt will complete walk in shower transfer with CGA  Skilled Therapeutic Interventions/Progress Updates:    Pt seen for OT, stating that she did not feel good and was having some dizziness.  She did not exhibit any nystagmus and stated that it didn't feel like the room was spinning.  She however, was not able to describe her dizziness.  BPs taking in supine at 94/60, sitting 99/73, and standing 103/69.  Pt would only participate minimally and completed transfer to the wheelchair with min guard assist in order for therapist to make the bed where water was spilled on it.  She then transferred back to the bed.  Therapist persuaded her to get up again and ambulate to the sink for oral hygiene.  She was able to complete again with min guard assist.  She returned to the bed and declined further OOB activity.  During session she was asked orientation questions to which she was unable to answer correctly.  She was oriented to place using her sign, but was not oriented to the month or day of the week.  When told that the month was three months before December, she needed max questioning cueing to figure it out.  Finished session with pt in bed and call button and phone in reach.  Bed alarm on and tele-sitter in place.   Therapy Documentation Precautions:  Precautions Precautions: Fall Precaution Comments: shuffling gait, tremors Restrictions Weight Bearing Restrictions: No General: General OT Amount of Missed Time: 10 Minutes Vital Signs: Therapy Vitals Temp: 97.8 F (36.6 C) Pulse Rate:  75 Resp: 19 BP: 103/69 Patient Position (if appropriate): Lying Oxygen Therapy SpO2: (!) 89 % O2 Device: Room Air Pain: Pain Assessment Pain Scale: 0-10 Pain Score: 0-No pain ADL: See Care Tool Section for some details of mobility and selfcare  Therapy/Group: Individual Therapy  Shareen Capwell OTR/L 01/25/2020, 4:12 PM

## 2020-01-25 NOTE — Progress Notes (Signed)
Patient ID: Julie Harmon, female   DOB: [birth year], 64 y.o.   MRN: [id number]  Sw received call from patient's sister inquiring about SNF's. Sw will complete FL2 and send out patient's referral. Sister understands that the goal is to get patient home. Will see what facilities offer on patient, if none patient still will be discharging home.  [place], [place] [phone]

## 2020-01-25 NOTE — Progress Notes (Signed)
Nutrition Follow-up  RD working remotely.  DOCUMENTATION CODES:   Severe malnutrition in context of acute illness/injury  INTERVENTION:   -Ensure Enlive poBIDbetween meals, each supplement provides 350 kcal and 20 grams of protein  - d/c Boost Breeze  - Magic Cup TID with meals, each supplement provides 290 kcal and 9 grams of protein  - MVI with minerals daily  - Encourage adequate intake of calories and protein at meals  NUTRITION DIAGNOSIS:   Severe Malnutrition related to acute illness (N/V/D, inadequate PO intake since CVA in June 2021) as evidenced by moderate fat depletion, severe muscle depletion, percent weight loss (18.7% weight loss in 3 months).  Ongoing  GOAL:   Patient will meet greater than or equal to 90% of their needs  Progressing  MONITOR:   PO intake, Supplement acceptance, Labs, Weight trends, I & O's  REASON FOR ASSESSMENT:   Consult Assessment of nutrition requirement/status  ASSESSMENT:   64 year old female with PMH of atrial fibrillation on Xarelto, CVA, COPD, depression, hyperthyroidism, recurrent DVTs s/p IVC filter. Pt admitted with acute small left embolic infarct. Therapies recommending CIR and pt admitted to CIR on 9/10.  Noted target d/c date of 9/24.  Noted pt's PO intake has dramatically improved. Prior to admission to CIR, pt was refusing meals. Meal completions now averaging 72%. Per MD notes, pt also eating graham crackers with peanut butter between meals.  RD was unable to reach pt via phone call to room. Pt refusing 100% of Boost Breeze and Ensure Enlive supplements per MAR. Will d/c Boost Breeze and decrease Ensure Enlive from TID to BID. Will also add Magic Cups TID with meals.  No new weights since admission.  Meal Completion: 25-100% x last 8 recorded meals (averaging 72%)  Medications reviewed and include: Boost Breeze BID with meals, Ensure Enlive TID, Reglan 10 mg TID before meals, MVI with minerals, miralax,  klor-con 20 mEq BID  Labs reviewed: potassium 3.1  Diet Order:   Diet Order            Diet regular Room service appropriate? Yes with Assist; Fluid consistency: Thin  Diet effective now                 EDUCATION NEEDS:   Not appropriate for education at this time  Skin:  Skin Assessment: Reviewed RN Assessment  Last BM:  01/25/20 small type 5  Height:   Ht Readings from Last 1 Encounters:  01/19/20 5\' 6"  (1.676 m)    Weight:   Wt Readings from Last 1 Encounters:  01/19/20 55.3 kg    Ideal Body Weight:  59.1 kg  BMI:  Body mass index is 19.68 kg/m.  Estimated Nutritional Needs:   Kcal:  1650-1850  Protein:  80-95 grams  Fluid:  1.6-1.8 L    03/20/20, MS, RD, LDN Inpatient Clinical Dietitian Please see [facility] for contact information.

## 2020-01-25 NOTE — Progress Notes (Signed)
Physical Therapy Session Note  Patient Details  Name: Julie Harmon MRN: [id number] Date of Birth: 08/24/1955  Today's Date: 01/25/2020 PT Individual Time: 2458-0998 PT Individual Time Calculation (min): 41 min   Short Term Goals: Week 1:  PT Short Term Goal 1 (Week 1): Pt will ambulate >133ft with LRAD and CGA PT Short Term Goal 2 (Week 1): Pt will perform 5xSTS in less then 25 seconds to demonstrate improved balance and safety with funcitonal transfers PT Short Term Goal 3 (Week 1): Pt will propell WC 111ft with min assist PT Short Term Goal 4 (Week 1): Pt will trasnfer to and from [facility] with  min assist consistently  Skilled Therapeutic Interventions/Progress Updates:    Pt received supine in bed using the phone and requesting assistance to call her daughter - educated pt on how to call long distance via operator but she will need assistance with this. Pt agreeable to therapy session with minimal encouragement. Pt slightly perseverating on the fact that her daughter, Julie Harmon, was not nice to her - therapist providing emotional support and pt easily redirected. Supine>sitting L EOB, independently. Sit<>stands, no AD, with CGA for steadying/safety during session. Stand pivot to w/c, no AD, with CGA.  Transported to/from gym in w/c for time management and energy conservation. Gait training ~251ft using R HHA with CGA for steadying - therapist facilitating R UE arm swing - with cuing pt demos improving B LE step lengths though towards end of walk pt starts demonstrating antalgic gait pattern reporting L LE lateral lower leg pain just distal to the knee near the fibular head - upon palpation to the area pt reports it feels "weird" - no significant instances of festinating noted this session.  B LE strengthening via: - seated hip abduction against level 2 theraband resistance with pt demoing significantly impaired L hip abductor activation and strength x12 reps - repeated sit<>stands to/from chair, no UE  support, with min assist for lifting/lowering and balance 2x6 reps with cuing for sequencing - attempted to use theraband around knees to promote hip abductor activation but pt with difficulty dual-tasking and has difficulty due to L hip weakness Dynamic gait and B LE strengthening via lateral side stepping with level 2 theraband around knees down/back x2 in // bars without UE support and min assist for balance - demos short, shuffled steps more impaired to R than L.  Dynamic standing balance and B LE strengthening via forward stepping up/down on/off 8" step with B HHA support on // bars progressed to only R HHA with heavy min assist for balance and lifting/loweirng x10 reps - progressed to repeated L LE lateral step up/downs x5 reps using B UE support on // bar and min assist for balance. Transported back to room in w/c and requesting to get back in bed. Stand pivot to EOB with CGA. Sit>supine independently. Pt left supine in bed with needs in reach and bed alarm on.   Therapy Documentation Precautions:  Precautions Precautions: Fall Precaution Comments: shuffling gait, tremors Restrictions Weight Bearing Restrictions: No  Pain: Reports L LE lateral lower leg pain that increases with longer distance ambulation - provided seated rest breaks for pain management - focused on strengthening hip and knee muscles to improve joint alignment and support.   Therapy/Group: Individual Therapy  Ginny Forth , PT, DPT, CSRS  01/25/2020, 3:41 PM

## 2020-01-26 ENCOUNTER — Inpatient Hospital Stay (HOSPITAL_COMMUNITY): Payer: Medicare Other | Admitting: Speech Pathology

## 2020-01-26 ENCOUNTER — Encounter (HOSPITAL_COMMUNITY): Payer: Medicare Other

## 2020-01-26 ENCOUNTER — Inpatient Hospital Stay (HOSPITAL_COMMUNITY): Payer: Medicare Other | Admitting: Occupational Therapy

## 2020-01-26 ENCOUNTER — Inpatient Hospital Stay (HOSPITAL_COMMUNITY): Payer: Medicare Other | Admitting: Physical Therapy

## 2020-01-26 DIAGNOSIS — I639 Cerebral infarction, unspecified: Secondary | ICD-10-CM

## 2020-01-26 MED ORDER — OXYCODONE HCL 5 MG PO TABS
20.0000 mg | ORAL_TABLET | Freq: Three times a day (TID) | ORAL | Status: DC | PRN
  Administered 2020-01-26 – 2020-01-31 (×8): 20 mg via ORAL
  Filled 2020-01-26 (×8): qty 4

## 2020-01-26 NOTE — Progress Notes (Addendum)
[facility] PHYSICAL MEDICINE & REHABILITATION PROGRESS NOTE  Subjective/Complaints:  No issues overnite, no pain c/os, signed FL 2  ROS:- limited by cognition,         Objective: Vital Signs: Blood pressure 121/60, pulse 60, temperature 98 F (36.7 C), temperature source Oral, resp. rate 19, height 5\' 6"  (1.676 m), weight 55.3 kg, SpO2 98 %. No results found. No results for input(s): WBC, HGB, HCT, PLT in the last 72 hours. No results for input(s): NA, K, CL, CO2, GLUCOSE, BUN, CREATININE, CALCIUM in the last 72 hours.  Physical Exam: BP 121/60 (BP Location: Right Arm)   Pulse 60 Comment: validated too early  Temp 98 F (36.7 C) (Oral)   Resp 19   Ht 5\' 6"  (1.676 m)   Wt 55.3 kg   SpO2 98%   BMI 19.68 kg/m    General: No acute distress Mood and affect are appropriate Heart: Regular rate and rhythm no rubs murmurs or extra sounds Lungs: Clear to auscultation, breathing unlabored, no rales or wheezes Abdomen: Positive bowel sounds, soft nontender to palpation, nondistended Extremities: No clubbing, cyanosis, or edema Skin: No evidence of breakdown, no evidence of rash  Oriented to person and place Motor: Grossly 4+ to 5/5 throughout, left upper extremity slightly weaker, stable Sensation reported as equal BUE  Assessment/Plan: 1. 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  Physiatrist is providing close team supervision and 24 hour management of active medical problems listed below.  Physiatrist and rehab team continue to assess barriers to discharge/monitor patient progress toward functional and medical goals  Care Tool:  Bathing    Body parts bathed by patient: Right arm, Left arm, Abdomen, Front perineal area, Chest, Right upper leg, Buttocks, Left upper leg, Right lower leg, Left lower leg, Face         Bathing  assist Assist Level: Contact Guard/Touching assist     Upper Body Dressing/Undressing Upper body dressing   What is the patient wearing?: Bra, Pull over shirt    Upper body assist Assist Level: Contact Guard/Touching assist    Lower Body Dressing/Undressing Lower body dressing      What is the patient wearing?: Pants, Underwear/pull up     Lower body assist Assist for lower body dressing: Contact Guard/Touching assist     Toileting Toileting    Toileting assist Assist for toileting: Contact Guard/Touching assist     Transfers Chair/bed transfer  Transfers assist     Chair/bed transfer assist level: Contact Guard/Touching assist     Locomotion Ambulation   Ambulation assist      Assist level: Contact Guard/Touching assist Assistive device: Hand held assist Max distance: 274ft   Walk 10 feet activity   Assist     Assist level: Contact Guard/Touching assist Assistive device: Hand held assist   Walk 50 feet activity   Assist    Assist level: Contact Guard/Touching assist Assistive device: Hand held assist    Walk 150 feet activity   Assist Walk 150 feet activity did not occur: Safety/medical concerns  Assist level: Contact Guard/Touching assist Assistive device: Hand held assist    Walk 10 feet on uneven surface  activity   Assist Walk 10 feet on uneven surfaces activity did not occur: Safety/medical concerns         Wheelchair     Assist Will patient use wheelchair at discharge?: No (No long  term goals per PT) Type of Wheelchair: Manual    Wheelchair assist level: Maximal Assistance - Patient 25 - 49% Max wheelchair distance: 50    Wheelchair 50 feet with 2 turns activity    Assist        Assist Level: Maximal Assistance - Patient 25 - 49%   Wheelchair 150 feet activity     Assist     Assist Level: Total Assistance - Patient < 25%      Medical Problem List and Plan: 1.  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 SLP  2.  Antithrombotics: -DVT/anticoagulation with history of DVT: Pradaxa started 9/9.             -antiplatelet therapy: N/A 3. Pain Management: OxyContin 20 mg every 12 hours (reportedly on OxyCR 40mg  q8  as needed ?PTA.  )- did ok with 20mg  dose, will try Oxy IR 20mg  q6 prn given prn use (not every day) - has confusion trying to minimize meds that can affect cognition        4. Mood: Xanax 0.25 mg twice daily as needed             -antipsychotic agents: N/A 5. Neuropsych: This patient is capable of making decisions on her own behalf.  Telesitter for safety, disoriented and with personality changes likely CVA related will ask neuropsych to assess- sister now electing SNF placement  6. Skin/Wound Care: Routine skin checks 7. Fluids/Electrolytes/Nutrition: Routine in and outs  Hypokalemic on labs today 9/13   -start kdur bid- recheck K+ in am  8.  Atrial fibrillation.  Cardizem 300 mg daily as well as Toprol 100 mg daily.  Continue Pradaxa.    Some bradycardic episodes may  Vitals:   01/25/20 1937 01/26/20 0452  BP: 120/74 121/60  Pulse: 60   Resp:    Temp: 97.9 F (36.6 C) 98 F (36.7 C)  SpO2: 98%    9.  Hyperlipidemia.    Lipitor 10.  Tobacco abuse/COPD.  NicoDerm patch.  Provide counseling 11.  Leukocytosis  WBCs 11.2 on 9/8--> 10.6 9/13  Afebrile 12.  Nausea-?  Resolved    avoid medications on empty stomach 13.  Poor intake but has >500 unrecorded cal per day, monitor weekly weight 14.  Constipation ,now with daily BMs       LOS: 7 days A FACE TO FACE EVALUATION WAS PERFORMED  01/27/20 01/26/2020, 8:09 AM

## 2020-01-26 NOTE — Progress Notes (Signed)
Occupational Therapy Weekly Progress Note  Patient Details  Name: Julie Harmon MRN: [id number] Date of Birth: May 25, 1955  Beginning of progress report period: January 20, 2020 End of progress report period: January 26, 2020  Today's Date: 01/26/2020 OT Individual Time: 6389-3734 OT Individual Time Calculation (min): 32 min    Patient has met 3 of 3 short term goals.  Pt is making steady progress with OT at this time.  She continues to demonstrate increased confusion and decreased memory and awareness.  She is not consistently oriented to the month, day of the month or the day of the week.  She is able to state that she has had a stroke but demonstrates no awareness of her deficits.  Selfcare tasks are currently completed at an overall min assist level, with supervision for UB and min assist for LB.  She is able to perform transfers with the RW as well at min guard assist.  With mobility she tends to demonstrate a flexed trunk and knee posture with increased shuffling gait pattern.  She needs max instructional cueing to take bigger steps.  Feel she is on target to meet current supervision level goals.  Recommend continued CIR level rehab until expected discharge of 9/24.    Patient continues to demonstrate the following deficits: muscle weakness, impaired timing and sequencing, unbalanced muscle activation and decreased coordination, decreased initiation, decreased attention, decreased awareness, decreased problem solving, decreased safety awareness, decreased memory and delayed processing and decreased standing balance and decreased balance strategies and therefore will continue to benefit from skilled OT intervention to enhance overall performance with BADL and Reduce care partner burden.  Patient progressing toward long term goals..  Continue plan of care.  OT Short Term Goals Week 2:  OT Short Term Goal 1 (Week 2): Continue working on established LTGs set at supervision level  overall.  Skilled Therapeutic Interventions/Progress Updates:    Pt completed supine to sit EOB with supervision to start session.  She then worked on donning her shoes and tying them.  She needed min assist to donn them secondary to not being able to get her heel down in the shoe.  She was not oriented to month or day of the month, but was able to use the calendar at the foot of her bed to help problem solve this.  She next ambulated down to the tub room where she completed tub transfers stepping into the tub with overall min guard assist.  She is going to her sister's at discharge, who has a shower tub with grab bars and a seat per pt report.  Will have to follow up about bathroom setup.  Had pt complete simulated toilet transfer as well with sit to stand from a lower surface.  She needed min assist to complete this without use of her UEs.  She completed it with min guard if allowed to push up from the seat with her hands.  She may need a 3:1 but will depend on her setup after talking to her sister.  Had pt ambulate back to the room with use of the RW and min guard assist.   Pt continues with shuffling gait pattern as well, requiring mod to max instructional cueing for bigger steps. Pt with decreased orientation of her room asking why we didn't go down the hall at [facility] to get to her room, not realizing that this was not the area we came from.  Once back in the room, she transferred to the bed and remained  there with call button and phone in reach and bed alarm in place.   Therapy Documentation Precautions:  Precautions Precautions: Fall Precaution Comments: shuffling gait Restrictions Weight Bearing Restrictions: No  Pain: Pain Assessment Pain Scale: Faces Pain Score: 0-No pain Faces Pain Scale: Hurts a little bit Pain Type: Acute pain Pain Location: Knee Pain Orientation: Right Pain Descriptors / Indicators: Discomfort Pain Onset: With Activity Pain Intervention(s):  Repositioned ADL: See Care Tool Section for some details of mobility and selfcare  Therapy/Group: Individual Therapy  Rovena Hearld OTR/L 01/26/2020, 3:47 PM

## 2020-01-26 NOTE — Progress Notes (Signed)
Occupational Therapy Session Note  Patient Details  Name: Julie Harmon MRN: [id number] Date of Birth: [birth year]  Today's Date: 01/26/2020 OT Group Time: 1100-1200 OT Group Time Calculation (min): 60 min    Short Term Goals: Week 1:  OT Short Term Goal 1 (Week 1): Pt will don pants with min A OT Short Term Goal 1 - Progress (Week 1): Met OT Short Term Goal 2 (Week 1): Pt will complete toileting tasks with CGA OT Short Term Goal 2 - Progress (Week 1): Met OT Short Term Goal 3 (Week 1): Pt will complete walk in shower transfer with CGA OT Short Term Goal 3 - Progress (Week 1): Met Week 2:  OT Short Term Goal 1 (Week 2): Continue working on established LTGs set at supervision level overall.  Skilled Therapeutic Interventions/Progress Updates:    Pt received in bed agreeable to group requiring MIN A to transfer into w/c. Pain stated at beginning of session 0/10. Repositioning, rest, and alternative strategies approached in session. Pt participated in an adaptive yoga group to addresses balance, strengthening, improving ROM/flexibility, alternative pain strategies, stress reduction and improve social participation/adjustment to situation by interacting with peers. Pt demo occasional internal distraction requiring redirection to conversation going on among group. Patient was led through warm up exercises in large movements to impact GMC/festinating movement pattern for all 4 limbs, formal adapted seated yoga poses, meditation and breath-work to address the above deficits. Pt tolerated well and returned to room with call light in reach and all needs met.   Therapy Documentation Precautions:  Precautions Precautions: Fall Precaution Comments: shuffling gait, tremors Restrictions Weight Bearing Restrictions: No General:   Vital Signs: Therapy Vitals Temp: 98 F (36.7 C) Temp Source: Oral BP: 121/60 Patient Position (if appropriate): Lying Oxygen Therapy O2 Device: Room Air Pain:    ADL: ADL Eating: Set up Where Assessed-Eating: Bed level Grooming: Minimal assistance Where Assessed-Grooming: Edge of bed Upper Body Bathing: Supervision/safety Where Assessed-Upper Body Bathing: Shower Lower Body Bathing: Minimal assistance Where Assessed-Lower Body Bathing: Shower Upper Body Dressing: Minimal assistance Where Assessed-Upper Body Dressing: Edge of bed Lower Body Dressing: Moderate assistance Where Assessed-Lower Body Dressing: Edge of bed Toileting: Minimal assistance Where Assessed-Toileting: Glass blower/designer: Moderate assistance Toilet Transfer Method: Counselling psychologist: Energy manager: Moderate assistance Social research officer, government Method: Heritage manager: Grab bars, Shower seat with back Glass blower/designer   Exercises:   Other Treatments:     Therapy/Group: Group Therapy  Tonny Branch 01/26/2020, 6:47 AM

## 2020-01-26 NOTE — Progress Notes (Signed)
Speech Language Pathology Weekly Progress and Session Note  Patient Details  Name: Julie Harmon MRN: [id number] Date of Birth: 1955/12/08  Beginning of progress report period: January 20, 2020 End of progress report period: January 26, 2020  Today's Date: 01/26/2020 SLP Individual Time: 7829-5621 SLP Individual Time Calculation (min): 42 min  Short Term Goals: Week 1: SLP Short Term Goal 1 (Week 1): Patient will orient to time, place, situation using visual aids with modA. SLP Short Term Goal 1 - Progress (Week 1): Met SLP Short Term Goal 2 (Week 1): Patient will elaborate on statements/responses to open-ended hypothetical problem solving questions with min-modA. SLP Short Term Goal 2 - Progress (Week 1): Met SLP Short Term Goal 3 (Week 1): Patient will demonstrate adequate safety awareness during performance of basic ADL's with modA SLP Short Term Goal 3 - Progress (Week 1): Met SLP Short Term Goal 4 (Week 1): Patient will name at least 10 different objects when given a particular category and 2 minute time limit, with modA SLP Short Term Goal 4 - Progress (Week 1): Met SLP Short Term Goal 5 (Week 1): Patient will demonstrate sustained attention during basic and mildly complex level problem solving tasks with min-mod A for redirection. SLP Short Term Goal 5 - Progress (Week 1): Met SLP Short Term Goal 6 (Week 1): Patient will maintain topic during structured conversation tasks with modA cues to redirect. SLP Short Term Goal 6 - Progress (Week 1): Met    New Short Term Goals: Week 2: SLP Short Term Goal 1 (Week 2): STG=LTG due to remaining length of stay  Weekly Progress Updates: Pt has made steady functional gains and met 6 out of 6 short term goals this reporting period. Pt is currently Min-Mod assist for basic familiar tasks and word finding in conversation due to mild expressive aphasia and cognitive impairments impacting her short term memory, emergent and safety awareness,  problem solving, attention, and orientation. 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 Pt has demonstrated improved ability to redirect to functional tasks, use word finding and basic problem solving strategies. Pt education is ongoing; no family has been present for ST sessions to date, but they would benefit from education regarding pt's current level of cognitive-linguistic function and impact on safety at home prior to discharge - has been communicated to SW. Pt would continue to benefit from skilled ST while inpatient in order to maximize functional independence and reduce burden of care prior to discharge. Anticipate that pt will need 24/7 supervision at discharge in addition to [place] follow up at next level of care.       Intensity: Minumum of 1-2 x/day, 30 to 90 minutes Frequency: 3 to 5 out of 7 days Duration/Length of Stay: 02/02/20 Treatment/Interventions: Cognitive remediation/compensation;Speech/Language facilitation;Functional tasks;Cueing hierarchy;Therapeutic Activities;Internal/external aids;Patient/family education   Daily Session  Skilled Therapeutic Interventions: Pt was seen for skilled ST targeting cognitive goals. SLP facilitated session with  Introduction of memory notebook as compensatory strategy for short term memory and to improve recall of daily information. Pt required Mod A for accurate use of calendar to orient to date. She expressed need to void during session; overall Min A verbal and visual cues provide for safety with stnading and use of walker when transferring from sitting to standing from edeg of bed and using walker during mobility to bathroom. Pt left laying in bed with alarm set and needs within reach. Continue per current plan of care.  Pain Pain Assessment Pain Scale: 0-10 Pain Score: 0-No pain  Therapy/Group: Individual Therapy  Arbutus Leas 01/26/2020, 12:14 PM

## 2020-01-26 NOTE — Progress Notes (Signed)
Physical Therapy Weekly Progress Note  Patient Details  Name: Julie Harmon MRN: [id number] Date of Birth: 06-28-1955  Beginning of progress report period: January 20, 2020 End of progress report period: January 26, 2020  Today's Date: 01/26/2020 PT Individual Time: 0354-6568 PT Individual Time Calculation (min): 53 min   Patient has met 2 of 3 short term goals. Ms. Jaggers is progressing well with therapy demonstrating increasing independence with functional mobility tasks. She is performing supine<>sit with supervision progressing to mod-I, sit<>stand and stand pivot transfers with CGA, ambulating up to 276f with CGA/min assist, and ascending/descending 8 stairs using HRs with min assist. During ambulation she demonstrates intermittent festination often when getting close to objects in environment or when turning as well as overall slow gait speed with increased trunk and knee flexion. She continues to demonstrate significant cognitive impairments with decreased awareness.  Patient continues to demonstrate the following deficits muscle weakness and muscle joint tightness, decreased cardiorespiratoy endurance, impaired timing and sequencing, unbalanced muscle activation, motor apraxia, decreased coordination and decreased motor planning,  ,  , decreased initiation, decreased attention, decreased awareness, decreased problem solving, decreased safety awareness, decreased memory and delayed processing and decreased standing balance, decreased postural control and decreased balance strategies and therefore will continue to benefit from skilled PT intervention to increase functional independence with mobility.  Patient progressing toward long term goals..  Continue plan of care.  PT Short Term Goals Week 1:  PT Short Term Goal 1 (Week 1): Pt will ambulate >1531fwith LRAD and CGA PT Short Term Goal 1 - Progress (Week 1): Met PT Short Term Goal 2 (Week 1): Pt will perform 5xSTS in less then 25  seconds to demonstrate improved balance and safety with funcitonal transfers PT Short Term Goal 2 - Progress (Week 1): Progressing toward goal PT Short Term Goal 3 (Week 1): Pt will propell WC 10066fith min assist PT Short Term Goal 3 - Progress (Week 1): Discontinued (comment) (pt will be a functional ambulator) PT Short Term Goal 4 (Week 1): Pt will trasnfer to and [facility]  min assist consistently PT Short Term Goal 4 - Progress (Week 1): Met Week 2:  PT Short Term Goal 1 (Week 2): = to LTGs based on ELOS  Skilled Therapeutic Interventions/Progress Updates:  Ambulation/gait training;Discharge planning;Functional mobility training;Psychosocial support;Therapeutic Activities;Visual/perceptual remediation/compensation;Wheelchair propulsion/positioning;Therapeutic Exercise;Neuromuscular re-education;Disease management/prevention;Balance/vestibular training;Cognitive remediation/compensation;DME/adaptive equipment instruction;Pain management;UE/LE Strength taining/ROM;Stair training;Patient/family education;Splinting/orthotics;UE/LE Coordination activities;Functional electrical stimulation;Community reintegration   Pt received supine in bed and initially not agreeable to therapy session with reports of fatigue but with encouragement agreeable to outdoor mobility. Supine>sitting L EOB, mod-I. Stand pivot to w/c, no AD, with CGA for steadying.  Transported to/from outside in w/c. Gait training ~200f31fth R HHA outside on unlevel brick pavement, concrete sidewalk with small incline/decline, curb step, ascended 10steps using R HR all with min assist for balance - continues to demo slow gait pattern, decreased B LE step lengths and foot clearances with pt demoing a few instances of festinating gait pattern. Gait training ~100ft56fh R HHA outside on unlevel surfaces with min assist as described above and attempted to descend stairs with L HR and R HHA; however, after 1 step pt reports significant increase  in L lateral lower leg pain near fibular head deferring continuing with stairs. Transported back up to CIR floor. Assessed L lateral lower leg near fibular head with no bruising nor discoloration of the area noted but pt does report some pain upon palpation - DanLinna Hoff  PA aware. Pt declines attempting ambulation with use of RW or rollator stating she feels that they will make her fall - will continue to address gait training with LRAD. With encouragement, pt agreeable to participate in standing balance/tolerance task using dynavision with CGA for steadying with average reaction time of 2.4 seconds. Despite encouragement, pt declines further participation in session reporting increasing fatigue. Transported back to room. Stand pivot to EOB, no AD, with CGA for steadying. Sit>sidelying mod-I. Pt left L sidelying in bed with needs in reach and bed alarm on.  Pt continues to demonstrate cognitive impairments throughout session with impaired awareness and impaired memory.   Therapy Documentation Precautions:  Precautions Precautions: Fall Precaution Comments: shuffling gait, tremors Restrictions Weight Bearing Restrictions: No  Pain:   Reports L lateral lower leg pain near the fibular head with pt reporting increased pain upon attempts at descending stairs leading with RLE - provided seated rest break for pain management- PA aware.  Therapy/Group: Individual Therapy  Tawana Scale , PT, DPT, CSRS  01/26/2020, 11:57 AM

## 2020-01-26 NOTE — Progress Notes (Signed)
Patient ID: Julie Harmon, female   DOB: 02-17-1956, 64 y.o.   MRN: [id number]   Patient referral sent out to SNF facilities in [place] and [place]. Will keep everyone updated of offers. If no bed offers, patient will discharge home with sister.   [place], [place] [phone]

## 2020-01-27 ENCOUNTER — Inpatient Hospital Stay (HOSPITAL_COMMUNITY): Payer: Medicare Other | Admitting: Physical Therapy

## 2020-01-27 ENCOUNTER — Inpatient Hospital Stay (HOSPITAL_COMMUNITY): Payer: Medicare (Managed Care) | Admitting: Occupational Therapy

## 2020-01-27 NOTE — Progress Notes (Signed)
Occupational Therapy Session Note  Patient Details  Name: Julie Harmon MRN: [id number] Date of Birth: 01/21/1956  Today's Date: 01/27/2020 OT Individual Time: 1345-1445 OT Individual Time Calculation (min): 60 min    Short Term Goals: Week 1:  OT Short Term Goal 1 (Week 1): Pt will don pants with min A OT Short Term Goal 1 - Progress (Week 1): Met OT Short Term Goal 2 (Week 1): Pt will complete toileting tasks with CGA OT Short Term Goal 2 - Progress (Week 1): Met OT Short Term Goal 3 (Week 1): Pt will complete walk in shower transfer with CGA OT Short Term Goal 3 - Progress (Week 1): Met Week 2:  OT Short Term Goal 1 (Week 2): Continue working on established LTGs set at supervision level overall.      Skilled Therapeutic Interventions/Progress Updates:    upon entering room, pt anxious to get OOB to bathroom. Stood with CGA and pt given RW to use but she kept trying to carry it vs using it despite cues. Pt completed 3/3 toileting tasks with close S. Ambulated with CGA to sink to wash hands with flexed knees and shuffling gait.   Pt stated she did not get the right lunch so a new plate was brought to her with 6 pieces of bacon.  Pt stated she eats bacon every day and she lost 50 lbs. Tried to educate pt on stroke prevention with healthy eating and that bacon is extemely high in sodium, fat and not a good source of protein. Pt expressed that she did not have a desire to change that.  Pt expressed she was anxious and stressed and pt was perseverating on "they are making me stay until the 24th. I was supposed to leave the 18th! This is ridiculous as I am ready to go!" Tried to reorient pt and discuss what she is working on in rehab. Pt did not seem to have any awareness of her stroke deficits and was overall very frustrated.  Pt finished the bacon and kept saying she was anxious to talk to her daughter.  The phone rang and pt answered and began a conversation laying down. During this time the  NT checked her vitals in bed - 102/64. Despite some confusion, I was able to hear pt talking to her sister about a current event that just happened so pt does have some global awareness.  Pt got off the phone and was willing to sit up to work on coordination exercises. Focused on [place] using large movement patterns while listening to music she likes (country [place]). Pt was working on UE over head reaching and stretching demonstrating impaired motor planning and weak LUE as she was following my demonstrations. She then stated she felt dizzy.  Had her do some LE AROM but pt had difficulty following the verbal and visual instructions and said she was just too tired.   Pt layed down in bed and assisted pt with dialing number for her daughter.  Bed alarm on and all needs met.    Therapy Documentation Precautions:  Precautions Precautions: Fall Precaution Comments: shuffling gait Restrictions Weight Bearing Restrictions: No   Pain:   ADL: ADL Eating: Set up Where Assessed-Eating: Bed level Grooming: Minimal assistance Where Assessed-Grooming: Edge of bed Upper Body Bathing: Supervision/safety Where Assessed-Upper Body Bathing: Shower Lower Body Bathing: Minimal assistance Where Assessed-Lower Body Bathing: Shower Upper Body Dressing: Minimal assistance Where Assessed-Upper Body Dressing: Edge of bed Lower Body Dressing: Moderate assistance Where Assessed-Lower Body  Dressing: Edge of bed Toileting: Minimal assistance Where Assessed-Toileting: Glass blower/designer: Moderate assistance Toilet Transfer Method: Counselling psychologist: Energy manager: Moderate assistance Social research officer, government Method: Heritage manager: Grab bars, Shower seat with back  Therapy/Group: Individual Therapy  Zayne Draheim 01/27/2020, 2:24 PM
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
## 2020-01-28 DIAGNOSIS — I639 Cerebral infarction, unspecified: Secondary | ICD-10-CM

## 2020-01-28 NOTE — Progress Notes (Signed)
[place] PHYSICAL MEDICINE & REHABILITATION PROGRESS NOTE  Subjective/Complaints:  Feels ok watching TV, asking about D/C date   ROS:- limited by cognition,         Objective: Vital Signs: Blood pressure 105/62, pulse 81, temperature 97.9 F (36.6 C), resp. rate 16, height 5\' 6"  (1.676 m), weight 55.3 kg, SpO2 93 %. No results found. No results for input(s): WBC, HGB, HCT, PLT in the last 72 hours. No results for input(s): NA, K, CL, CO2, GLUCOSE, BUN, CREATININE, CALCIUM in the last 72 hours.  Physical Exam: BP 105/62 (BP Location: Right Arm)   Pulse 81   Temp 97.9 F (36.6 C)   Resp 16   Ht 5\' 6"  (1.676 m)   Wt 55.3 kg   SpO2 93%   BMI 19.68 kg/m     General: No acute distress Mood and affect are appropriate Heart: Regular rate and rhythm no rubs murmurs or extra sounds Lungs: Clear to auscultation, breathing unlabored, no rales or wheezes Abdomen: Positive bowel sounds, soft nontender to palpation, nondistended Extremities: No clubbing, cyanosis, or edema Skin: No evidence of breakdown, no evidence of rash   Oriented to person and place Motor: Grossly 4+ to 5/5 throughout, left upper extremity slightly weaker, stable Sensation reported as equal BUE  Assessment/Plan: 1. 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  Physiatrist is providing close team supervision and 24 hour management of active medical problems listed below.  Physiatrist and rehab team continue to assess barriers to discharge/monitor patient progress toward functional and medical goals  Care Tool:  Bathing    Body parts bathed by patient: Right arm, Left arm, Abdomen, Front perineal area, Chest, Right upper leg, Buttocks, Left upper leg, Right lower leg, Left lower leg, Face         Bathing assist Assist Level: Contact Guard/Touching assist      Upper Body Dressing/Undressing Upper body dressing   What is the patient wearing?: Bra, Pull over shirt    Upper body assist Assist Level: Contact Guard/Touching assist    Lower Body Dressing/Undressing Lower body dressing      What is the patient wearing?: Pants, Underwear/pull up     Lower body assist Assist for lower body dressing: Contact Guard/Touching assist     Toileting Toileting    Toileting assist Assist for toileting: Supervision/Verbal cueing     Transfers Chair/bed transfer  Transfers assist     Chair/bed transfer assist level: Contact Guard/Touching assist     Locomotion Ambulation   Ambulation assist      Assist level: Contact Guard/Touching assist Assistive device: Walker-rolling Max distance: 45ft   Walk 10 feet activity   Assist     Assist level: Contact Guard/Touching assist Assistive device: Walker-rolling   Walk 50 feet activity   Assist    Assist level: Minimal Assistance - Patient > 75% Assistive device: Hand held assist    Walk 150 feet activity   Assist Walk 150 feet activity did not occur: Safety/medical concerns  Assist level: Minimal Assistance - Patient > 75% Assistive device: Hand held assist    Walk 10 feet on uneven surface  activity   Assist Walk 10 feet on uneven surfaces activity did not occur: Safety/medical concerns   Assist level: Minimal Assistance - Patient > 75% Assistive device: Hand held assist   Wheelchair     Assist Will patient use wheelchair  at discharge?: No (No long term goals per PT) Type of Wheelchair: Manual    Wheelchair assist level: Maximal Assistance - Patient 25 - 49% Max wheelchair distance: 50    Wheelchair 50 feet with 2 turns activity    Assist        Assist Level: Maximal Assistance - Patient 25 - 49%   Wheelchair 150 feet activity     Assist     Assist Level: Total Assistance - Patient < 25%      Medical Problem List and Plan: 1.   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 SLP  2.  Antithrombotics: -DVT/anticoagulation with history of DVT: Pradaxa started 9/9.             -antiplatelet therapy: N/A 3. Pain Management: OxyContin 20 mg every 12 hours (reportedly on OxyCR 40mg  q8  as needed ?PTA.  )- did ok with 20mg  dose, will try Oxy IR 20mg  q6 prn given prn use (not every day) - has confusion trying to minimize meds that can affect cognition        4. Mood: Xanax 0.25 mg twice daily as needed             -antipsychotic agents: N/A 5. Neuropsych: This patient is capable of making decisions on her own behalf.  Telesitter for safety, disoriented and with personality changes likely CVA related will ask neuropsych to assess- sister now electing SNF placement  6. Skin/Wound Care: Routine skin checks 7. Fluids/Electrolytes/Nutrition: Routine in and outs  Hypokalemic on labs today 9/13   -start kdur bid- recheck K+ in am  8.  Atrial fibrillation.  Cardizem 300 mg daily as well as Toprol 100 mg daily.  Continue Pradaxa.    HR controlled  Vitals:   01/27/20 1957 01/28/20 0411  BP: 123/60 105/62  Pulse: 73 81  Resp: 20 16  Temp: 98.7 F (37.1 C) 97.9 F (36.6 C)  SpO2: 93%    9.  Hyperlipidemia.    Lipitor 10.  Tobacco abuse/COPD.  NicoDerm patch.  Provide counseling 11.  Leukocytosis  WBCs 11.2 on 9/8--> 10.6 9/13  Afebrile 12.  Nausea-?  Resolved    avoid medications on empty stomach 13.  Poor intake but has >500 unrecorded cal per day, monitor weekly weight 14.  Constipation ,now with daily BMs       LOS: 9 days A FACE TO FACE EVALUATION WAS PERFORMED  01/29/20 01/28/2020, 11:20 AM

## 2020-01-29 ENCOUNTER — Encounter (HOSPITAL_COMMUNITY): Payer: Medicare Other | Admitting: Psychology

## 2020-01-29 ENCOUNTER — Inpatient Hospital Stay (HOSPITAL_COMMUNITY): Payer: Medicare Other

## 2020-01-29 ENCOUNTER — Inpatient Hospital Stay (HOSPITAL_COMMUNITY): Payer: Medicare Other | Admitting: Occupational Therapy

## 2020-01-29 DIAGNOSIS — D7218 Eosinophilia in diseases classified elsewhere: Secondary | ICD-10-CM

## 2020-01-29 DIAGNOSIS — G894 Chronic pain syndrome: Secondary | ICD-10-CM

## 2020-01-29 NOTE — Progress Notes (Signed)
Speech Language Pathology Daily Session Note  Patient Details  Name: Julie Harmon MRN: [id number] Date of Birth: 1956/04/20  Today's Date: 01/29/2020 SLP Individual Time: 1102-1201 SLP Individual Time Calculation (min): 59 min  Short Term Goals: Week 2: SLP Short Term Goal 1 (Week 2): STG=LTG due to remaining length of stay  Skilled Therapeutic Interventions:Skilled ST services focused on cognitive skills. Pt was orientated to place and situation, requireing max A visual cues for time and city. Pt demonstrated frastruation and confusion pertaining to discharge date, believing it to be tomorrow verse planned date of Friday. SLP provided education, however pt continued to deny discharge date. SLP facilitated basic problem solving and error awareness skills in money counting/displaying task with simple change, Pt demonstrated ability to display simple change (example: 35 cent), but required step by step cuing to form a 1.00 or more in change. Pt demonstrated ability to sequence three step picture cards with max A step by step cuing fading to mod A verbal cues when asked to verbalize steps prior to sequencing. Pt demonstrated little awareness of errors during the task, but was able to identify not correct errors upon reflection. No information was recorded in memory notebook since Friday, therefore SLP updated notebook to reflect today's events with speech therapy, pt required supervision A verbal cues to recall tasks.  Pt was left in room with call bell within reach and bed alarm set. SLP recommends to continue skilled services.     Pain Pain Assessment Pain Score: 0-No pain  Therapy/Group: Individual Therapy  Elfie Costanza  [facility] 01/29/2020, 12:17 PM

## 2020-01-29 NOTE — Progress Notes (Signed)
Occupational Therapy Session Note  Patient Details  Name: Julie Harmon MRN: [id number] Date of Birth: [birth year]  Today's Date: 01/29/2020 OT Individual Time: 1422-1506 OT Individual Time Calculation (min): 44 min    Short Term Goals: Week 2:  OT Short Term Goal 1 (Week 2): Continue working on established LTGs set at supervision level overall.  Skilled Therapeutic Interventions/Progress Updates:    Pt completed supine to sit EOB with supervision.  She needed increased time and mod instructional cueing to donn her shoes and tie them.  Three attempts were needed to complete tying of the left shoe.  Initially, she tried to donn the shoes over her gripper socks, and therapist suggested removing them and just wearing her regular socks with them.  When attempting to put them on, she became distracted with conversation and perseveration on going home tomorrow that she tried to donn them over her gripper sock, instead of removing the gripper socks first.  She eventually completed the task but voiced disagreement with staying until planned discharge on Friday.  Therapist pleasantly re-directed her to things she needed to work on and then had her work on orientation.  She was oriented to month, but not day of the week or day of the month.  She was then given two items to recall from the pantry in the kitchen while sitting.  Next, she ambulated to the kitchen with constant min assist and no device.  She exhibits short shuffling steps if therapist does not help facilitate this.  Once down in the ADL kitchen, she was able to recall and state the two items and then find them.  On her way back to the room she was told the room number, but was unable to recall this after approximately three minute delay.  Pt was returned to the bed to rest with call button and phone in reach and safety belt in place.    Therapy Documentation Precautions:  Precautions Precautions: Fall Precaution Comments: shuffling  gait Restrictions Weight Bearing Restrictions: No  Pain: Pain Assessment Pain Scale: Faces Pain Score: 0-No pain Faces Pain Scale: Hurts little more Pain Type: Acute pain Pain Location: Head Pain Descriptors / Indicators: Headache Pain Onset: On-going Pain Intervention(s): RN made aware;Medication (See eMAR) ADL: See Care Tool Section for some details of mobility and selfcare  Therapy/Group: Individual Therapy  Caliph Borowiak OTR/L 01/29/2020, 3:55 PM

## 2020-01-29 NOTE — Progress Notes (Signed)
Patient ID: Julie Harmon, female   DOB: [birth year], 64 y.o.   MRN: [id number]   Patient owns [name]

## 2020-01-29 NOTE — Progress Notes (Signed)
Occupational Therapy Session Note ° °Patient Details  °Name: Julie Harmon °MRN: [id number] °Date of Birth: 12/16/1955 ° °Today's Date: 01/29/2020 °OT Individual Time: 1004-1029 °OT Individual Time Calculation (min): 25 min  ° ° °Short Term Goals: °Week 2:  OT Short Term Goal 1 (Week 2): Continue working on established LTGs set at supervision level overall. ° °Skilled Therapeutic Interventions/Progress Updates:  °  Pt received supine calling out to go to the bathroom. Pt completed bed mobility to EOB with (S). Pt used RW to complete ambulatory transfer into the bathroom with CGA- short shuffling gait and poor RW management. Pt unable to sequence proper RW management despite mod multimodal cueing when completing stand > sit. Pt completed toileting tasks with (S) overall, voiding urine. Pt stood at the sink for 5 min to wash hands and while OT braided hair before requesting a seated rest break. She transitioned to supine and was agreeable to bed level UE strengthening circuit. She used a 3 lb dowel to follow demonstration for chest press, forward shoulder raises, and bicep curls. Pt left supine with all needs met, bed alarm set.  ° °Therapy Documentation °Precautions:  °Precautions °Precautions: Fall °Precaution Comments: shuffling gait °Restrictions °Weight Bearing Restrictions: No ° ° °Therapy/Group: Individual Therapy ° °Sandra H Davis °01/29/2020, 6:49 AM ° °

## 2020-01-29 NOTE — Progress Notes (Signed)
[facility] PHYSICAL MEDICINE & REHABILITATION PROGRESS NOTE  Subjective/Complaints:  'I'm read to to go. I NEED to go.   ROS: Patient denies fever, rash, sore throat, blurred vision, nausea, vomiting, diarrhea, cough, shortness of breath or chest pain, joint or back pain, headache, or mood change.         Objective: Vital Signs: Blood pressure 104/65, pulse 89, temperature 98.5 F (36.9 C), temperature source Oral, resp. rate 20, height 5\' 6"  (1.676 m), weight 55.3 kg, SpO2 97 %. No results found. No results for input(s): WBC, HGB, HCT, PLT in the last 72 hours. No results for input(s): NA, K, CL, CO2, GLUCOSE, BUN, CREATININE, CALCIUM in the last 72 hours.  Physical Exam: BP 104/65 (BP Location: Right Arm)   Pulse 89   Temp 98.5 F (36.9 C) (Oral)   Resp 20   Ht 5\' 6"  (1.676 m)   Wt 55.3 kg   SpO2 97%   BMI 19.68 kg/m     Constitutional: No distress . Vital signs reviewed. HEENT: EOMI, oral membranes moist Neck: supple Cardiovascular: RRR without murmur. No JVD    Respiratory/Chest: CTA Bilaterally without wheezes or rales. Normal effort    GI/Abdomen: BS +, non-tender, non-distended Ext: no clubbing, cyanosis, or edema Psych: pleasant and cooperative But a little anxious.  Skin: No evidence of breakdown, no evidence of rash Oriented to person and place Motor: Grossly 4+/5 throughout LLE, UE 4/5 Sensation reported as equal BUE   Assessment/Plan: 1. 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  Physiatrist is providing close team supervision and 24 hour management of active medical problems listed below.  Physiatrist and rehab team continue to assess barriers to discharge/monitor patient progress toward functional and medical goals  Care Tool:  Bathing    Body parts bathed by patient: Right arm, Left arm, Abdomen,  Front perineal area, Chest, Right upper leg, Buttocks, Left upper leg, Right lower leg, Left lower leg, Face         Bathing assist Assist Level: Contact Guard/Touching assist     Upper Body Dressing/Undressing Upper body dressing   What is the patient wearing?: Bra, Pull over shirt    Upper body assist Assist Level: Contact Guard/Touching assist    Lower Body Dressing/Undressing Lower body dressing      What is the patient wearing?: Pants, Underwear/pull up     Lower body assist Assist for lower body dressing: Contact Guard/Touching assist     Toileting Toileting    Toileting assist Assist for toileting: Supervision/Verbal cueing     Transfers Chair/bed transfer  Transfers assist     Chair/bed transfer assist level: Contact Guard/Touching assist     Locomotion Ambulation   Ambulation assist      Assist level: Contact Guard/Touching assist Assistive device: Walker-rolling Max distance: 36ft   Walk 10 feet activity   Assist     Assist level: Contact Guard/Touching assist Assistive device: Walker-rolling   Walk 50 feet activity   Assist    Assist level: Minimal Assistance - Patient > 75% Assistive device: Hand held assist    Walk 150 feet activity   Assist Walk 150 feet activity did not occur: Safety/medical concerns  Assist level: Minimal Assistance - Patient > 75% Assistive device: Hand held assist    Walk 10 feet on uneven surface  activity   Assist Walk 10 feet on uneven surfaces activity  did not occur: Safety/medical concerns   Assist level: Minimal Assistance - Patient > 75% Assistive device: Hand held assist   Wheelchair     Assist Will patient use wheelchair at discharge?: No (No long term goals per PT) Type of Wheelchair: Manual    Wheelchair assist level: Maximal Assistance - Patient 25 - 49% Max wheelchair distance: 50    Wheelchair 50 feet with 2 turns activity    Assist        Assist Level:  Maximal Assistance - Patient 25 - 49%   Wheelchair 150 feet activity     Assist     Assist Level: Total Assistance - Patient < 25%      Medical Problem List and Plan: 1.  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 SLP  -discussed with pt that she will need to speak to the team and discuss discharge planning.   2.  Antithrombotics: -DVT/anticoagulation with history of DVT: Pradaxa started 9/9.             -antiplatelet therapy: N/A 3. Pain Management: OxyContin 20 mg every 12 hours (reportedly on OxyCR 40mg  q8  as needed ?PTA.  )- did ok with 20mg  dose, will try Oxy IR 20mg  q6 prn given prn use (not every day) - has confusion trying to minimize meds that can affect cognition        4. Mood: Xanax 0.25 mg twice daily as needed             -antipsychotic agents: N/A 5. Neuropsych: This patient is capable of making decisions on her own behalf.  Telesitter for safety, disoriented and with personality changes likely CVA related will ask neuropsych to assess- sister now electing SNF placement  6. Skin/Wound Care: Routine skin checks 7. Fluids/Electrolytes/Nutrition: Routine in and outs  Hypokalemic on labs t 9/13   -start kdur bid- recheck K+ labs pending 8.  Atrial fibrillation.  Cardizem 300 mg daily as well as Toprol 100 mg daily.  Continue Pradaxa.    HR controlled  Vitals:   01/28/20 1934 01/29/20 0521  BP: (!) 105/51 104/65  Pulse: 76 89  Resp: 16 20  Temp: 97.6 F (36.4 C) 98.5 F (36.9 C)  SpO2: 95% 97%   9.  Hyperlipidemia.    Lipitor 10.  Tobacco abuse/COPD.  NicoDerm patch.  Provide counseling 11.  Leukocytosis  WBCs 11.2 on 9/8--> 10.6 9/13  Afebrile 12.  Nausea-   avoid medications on empty stomach 13.  Poor intake but has  significant unrecorded cal per day  -+222 yesterday monitor weekly  weight  -ate 25-100% yesterday Filed Weights   01/19/20 1353  Weight: 55.3 kg    14.  Constipation ,now with daily BMs          LOS: 10 days A FACE TO FACE EVALUATION WAS PERFORMED  01/31/20 01/29/2020, 12:32 PM
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
## 2020-01-29 NOTE — Progress Notes (Signed)
Physical Therapy Session Note  Patient Details  Name: Sylva Overley MRN: [id number] Date of Birth: 11-27-1955  Today's Date: 01/29/2020 PT Missed Time: 60 Minutes Missed Time Reason: Increased agitation;Patient unwilling to participate  Short Term Goals: Week 2:  PT Short Term Goal 1 (Week 2): = to LTGs based on ELOS  Skilled Therapeutic Interventions/Progress Updates:    Patient received sitting up in bed adamant that she is leaving today. PT attempting to reorient patient to time and place stating that her dc date is currently set for 9/24 on Friday. Patient requesting to see her calendar. PT observed that patient had crossed off all of the days leading up to the 24th and was stating that today's the 24th and so she's going home. Patient stating that since she's going home today there's nothing for her to work on in therapy. RN attempting to encourage patient to participate as well without success. Patient remaining in bed with bed alarm on, call light within reach.   Therapy Documentation Precautions:  Precautions Precautions: Fall Precaution Comments: shuffling gait Restrictions Weight Bearing Restrictions: No    Therapy/Group: Individual Therapy  Elizebeth Koller, PT, DPT, CBIS 01/29/2020, 7:41 AM

## 2020-01-30 ENCOUNTER — Inpatient Hospital Stay (HOSPITAL_COMMUNITY): Payer: Medicare (Managed Care)

## 2020-01-30 ENCOUNTER — Inpatient Hospital Stay (HOSPITAL_COMMUNITY): Payer: Medicare (Managed Care) | Admitting: Occupational Therapy

## 2020-01-30 DIAGNOSIS — I639 Cerebral infarction, unspecified: Secondary | ICD-10-CM

## 2020-01-30 LAB — BASIC METABOLIC PANEL
Anion gap: 11 (ref 5–15)
BUN: 23 mg/dL (ref 8–23)
CO2: 26 mmol/L (ref 22–32)
Calcium: 9.6 mg/dL (ref 8.9–10.3)
Chloride: 103 mmol/L (ref 98–111)
Creatinine, Ser: 0.92 mg/dL (ref 0.44–1.00)
GFR calc Af Amer: >60 mL/min (ref >60)
GFR calc non Af Amer: >60 mL/min (ref >60)
Glucose, Bld: 109 mg/dL — ABNORMAL HIGH (ref 70–99)
Potassium: 4.7 mmol/L (ref 3.5–5.1)
Sodium: 140 mmol/L (ref 135–145)

## 2020-01-30 NOTE — Progress Notes (Signed)
Physical Therapy Session Note  Patient Details  Name: Julie Harmon MRN: [id number] Date of Birth: [birth year]  Today's Date: 01/30/2020 PT Individual Time: 1100-1115 PT Individual Time Calculation (min): 15 min  and Today's Date: 01/30/2020 PT Missed Time: 45 Minutes Missed Time Reason: Patient fatigue;Patient unwilling to participate  Short Term Goals: Week 2:  PT Short Term Goal 1 (Week 2): = to LTGs based on ELOS  Skilled Therapeutic Interventions/Progress Updates:    Patient received supine in bed very tearful. She maintains that she's dcing home today and is very upset that a dr (PT unable to discern which dr) told her that they would be able to help her to dc home today. Patient also anxious about and believes that her neighbor in [place] has COVID because she heard him cough. PT reassuring patient that necessary precautions are in place to limit her exposure to COVID when in the hospital. PT attempting to reorient patient to date/time and need for therapy with no avail. PT providing extensive education on need for further therapy to improve balance and ensure safe dc home. Patient refusing to get out of bed at this time stating that a dr will be coming shortly to help her dc home. RN aware of patients response and refusal. Bed alarm on, call light within reach.   Therapy Documentation Precautions:  Precautions Precautions: Fall Precaution Comments: shuffling gait Restrictions Weight Bearing Restrictions: No   Therapy/Group: Individual Therapy  Elizebeth Koller, PT, DPT, CBIS 01/30/2020, 7:42 AM

## 2020-01-30 NOTE — Progress Notes (Signed)
Occupational Therapy Session Note  Patient Details  Name: Julie Harmon MRN: [id number] Date of Birth: 15-Feb-1956  Today's Date: 01/30/2020 OT Individual Time: 9223-0097 OT Individual Time Calculation (min): 40 min    Short Term Goals: Week 2:  OT Short Term Goal 1 (Week 2): Continue working on established LTGs set at supervision level overall.  Skilled Therapeutic Interventions/Progress Updates:    Pt received supine with no c/o pain. Perseverative on going home and oriented to place only. Pt initially declined therapy session d/t "going home in a couple hours" but was able to be redirected to take shower with encouragement. Pt completed bed mobility with CGA and increased time. Pt completed ambulatory transfer into the bathroom with min HHA, and short shuffling gait but no LOB. Pt required min cueing for safety when doffing clothing and transferring into shower- almost showering with bra and underwear still on until cueing provided. Pt completed UB bathing with min cueing and min A to shave underarms, but bathing with (S) overall. Pt completed LB bathing with (S) while seated, declining to stand. Pt washed hair with mod A for thoroughness. Pt transferred to w/c and donned bra with min A to fasten. Shirt donned mod I. Underwear donned with min A, pants with CGA. Hair care assist provided with mod A for fatigue. Pt returned to supine in bed and was left with all needs met- bed alarm set.   Therapy Documentation Precautions:  Precautions Precautions: Fall Precaution Comments: shuffling gait Restrictions Weight Bearing Restrictions: No   Therapy/Group: Individual Therapy  [name] 01/30/2020, 9:19 AM

## 2020-01-30 NOTE — Progress Notes (Signed)
Patient ID: Julie Harmon, female   DOB: 06/02/1955, 64 y.o.   MRN: [id number]   Family education scheduled for tomorrow, 9/22 1-3PM.  Lavera Guise, [place] [phone]

## 2020-01-30 NOTE — Plan of Care (Signed)
  Problem: RH Cognition - SLP Goal: RH LTG Patient will demonstrate orientation with cues Description:  LTG:  Patient will demonstrate orientation to person/place/time/situation with cues (SLP)   Flowsheets (Taken 01/30/2020 0750) LTG: Patient will demonstrate orientation using cueing (SLP): (downgraded due to slow progress and baseline deficits) Minimal Assistance - Patient > 75% Note: Downgraded due to slow progress and baseline deficits    Problem: RH Expression Communication Goal: LTG Patient will verbally express basic/complex needs(SLP) Description: LTG:  Patient will verbally express basic/complex needs, wants or ideas with cues  (SLP) Flowsheets (Taken 01/30/2020 0750) LTG: Patient will verbally express basic/complex needs, wants or ideas (SLP): (downgraded due to slow progress and baseline deficits) Minimal Assistance - Patient > 75% Note: Downgraded due slow progress and baseline deficits  Goal: LTG Patient will increase word finding of common (SLP) Description: LTG:  Patient will increase word finding of common objects/daily info/abstract thoughts with cues using compensatory strategies (SLP). Flowsheets (Taken 01/30/2020 0750) LTG: Patient will increase word finding of common (SLP): (downgraded due to slow progress and baseline deficits) Minimal Assistance - Patient > 75% Note: Downgraded due to slow progress and baseline deficits    Problem: RH Problem Solving Goal: LTG Patient will demonstrate problem solving for (SLP) Description: LTG:  Patient will demonstrate problem solving for basic/complex daily situations with cues  (SLP) Flowsheets (Taken 01/30/2020 0750) LTG Patient will demonstrate problem solving for: (downgraded due to slow progress and baseline deficits) Minimal Assistance - Patient > 75% Note: Downgraded due to slow progress and baseline deficits    Problem: RH Attention Goal: LTG Patient will demonstrate this level of attention during functional activites  (SLP) Description: LTG:  Patient will will demonstrate this level of attention during functional activites (SLP) Flowsheets (Taken 01/30/2020 0750) Patient will demonstrate during cognitive/linguistic activities the attention type of: Sustained LTG: Patient will demonstrate this level of attention during cognitive/linguistic activities with assistance of (SLP): (downgraded due to slow progress and baseline deficits) Minimal Assistance - Patient > 75% Number of minutes patient will demonstrate attention during cognitive/linguistic activities: downgraded due to slow progress and baseline deficits   Problem: RH Awareness Goal: LTG: Patient will demonstrate awareness during functional activites type of (SLP) Description: LTG: Patient will demonstrate awareness during functional activites type of (SLP) Flowsheets (Taken 01/30/2020 0750) LTG: Patient will demonstrate awareness during cognitive/linguistic activities with assistance of (SLP): (downgraded due to slow progress and baseline deficits) Moderate Assistance - Patient 50 - 74% Note: Downgraded due to slow progress and baseline deficits

## 2020-01-30 NOTE — Progress Notes (Signed)
Occupational Therapy Session Note  Patient Details  Name: Julie Harmon MRN: [id number] Date of Birth: [birth year]  Today's Date: 01/30/2020 OT Individual Time: 4259-5638 OT Individual Time Calculation (min): 30 min    Short Term Goals: Week 2:  OT Short Term Goal 1 (Week 2): Continue working on established LTGs set at supervision level overall.  Skilled Therapeutic Interventions/Progress Updates:    Pt in bed to start session, reluctantly agreeable to participation in OT.  She picked up her call button pressing the call light.  When therapist asked what she was wanting, she stated that she was trying to call her sister.  She was not aware that she was holding the call button and not the phone.  When therapist told her this, she disagreed that it was not the telephone.  Therapist removed the call button from her hand and re-directed her to move to the edge of the bed for session.  Pt oriented to place and reason for hospitalization as well as month, but reports no deficits from her CVA.  She transferred to the EOB and worked on donning her shoes.  Min assist was needed for putting her heel all the way down in the shoe secondary to donning the shoes over her gripper socks.  She continued to perseverate on going home tomorrow, with therapist having to re-direct her that it was Friday that she was leaving, not tomorrow.  Pt somewhat argumentative when re-directed on the situation so therapist dropped discussion and focused on task at hand which was for pt to ambulate down to the therapy gym.  Pt stood with min guard assist and completed functional mobility to the gym with occasional min assist and no device.  Continued short step length and shuffling pattern is still present as well as flexed knee posture.  Once in the gym, she sat on the therapy mat and completed Nine Hole Peg Test.  She needed multiple attempts secondary to not being able to recall the specific directions to remove the pegs after she had  place them as fast as she could.  She needed 78 seconds for both the right and left hands.  Finished session with return to the room and pt not being able to state her room number when asked "45".  She was however able to remember the general orientation of the room location and find her way back.  Once in the room she returned to the bed and was left with the call button and phone in reach and bed alarm in place.    Therapy Documentation Precautions:  Precautions Precautions: Fall Precaution Comments: shuffling gait Restrictions Weight Bearing Restrictions: No  Pain: Pain Assessment Pain Scale: Faces Pain Score: 0-No pain Faces Pain Scale: No hurt ADL: See Care Tool Section for some details of mobility and selfcare  Therapy/Group: Individual Therapy  Gurney Balthazor OTR/L 01/30/2020, 4:11 PM

## 2020-01-30 NOTE — Progress Notes (Signed)
Patient ID: Julie Harmon, female   DOB: Oct 28, 1955, 64 y.o.   MRN: [id number]   Raised toilet seat ordered through [employer]

## 2020-01-30 NOTE — Plan of Care (Signed)
  Problem: Sit to Stand Goal: LTG:  Patient will perform sit to stand in prep for activites of daily living with assistance level (OT) Description: LTG:  Patient will perform sit to stand in prep for activites of daily living with assistance level (OT) Flowsheets (Taken 01/30/2020 1626) LTG: PT will perform sit to stand in prep for activites of daily living with assistance level: (Goal downgraded based on slower progress than originally expected.) Contact Guard/Touching assist Note: Goal downgraded based on slower progress than originally expected.   Problem: RH Bathing Goal: LTG Patient will bathe all body parts with assist levels (OT) Description: LTG: Patient will bathe all body parts with assist levels (OT) Flowsheets (Taken 01/30/2020 1626) LTG: Pt will perform bathing with assistance level/cueing: (Goal downgraded based on slower progress than originally expected.) Contact Guard/Touching assist Note: Goal downgraded based on slower progress than originally expected.   Problem: RH Dressing Goal: LTG Patient will perform lower body dressing w/assist (OT) Description: LTG: Patient will perform lower body dressing with assist, with/without cues in positioning using equipment (OT) Flowsheets (Taken 01/30/2020 1626) LTG: Pt will perform lower body dressing with assistance level of: (Goal downgraded based on slower progress than originally expected.) Contact Guard/Touching assist Note: Goal downgraded based on slower progress than originally expected.   Problem: RH Toileting Goal: LTG Patient will perform toileting task (3/3 steps) with assistance level (OT) Description: LTG: Patient will perform toileting task (3/3 steps) with assistance level (OT)  Flowsheets (Taken 01/30/2020 1626) LTG: Pt will perform toileting task (3/3 steps) with assistance level: (Goal downgraded based on slower progress than originally expected.) Contact Guard/Touching assist Note: Goal downgraded based on slower  progress than originally expected.   Problem: RH Toilet Transfers Goal: LTG Patient will perform toilet transfers w/assist (OT) Description: LTG: Patient will perform toilet transfers with assist, with/without cues using equipment (OT) Flowsheets (Taken 01/30/2020 1626) LTG: Pt will perform toilet transfers with assistance level of: (Goal downgraded based on slower progress than originally expected.) Contact Guard/Touching assist Note: Goal downgraded based on slower progress than originally expected.   Problem: RH Tub/Shower Transfers Goal: LTG Patient will perform tub/shower transfers w/assist (OT) Description: LTG: Patient will perform tub/shower transfers with assist, with/without cues using equipment (OT) Flowsheets (Taken 01/30/2020 1626) LTG: Pt will perform tub/shower stall transfers with assistance level of: (Goal downgraded based on slower progress than originally expected.) Contact Guard/Touching assist Note: Goal downgraded based on slower progress than originally expected.   Problem: RH Awareness Goal: LTG: Patient will demonstrate awareness during functional activites type of (OT) Description: LTG: Patient will demonstrate awareness during functional activites type of (OT) Flowsheets (Taken 01/30/2020 1626) Patient will demonstrate awareness during functional activites type of: Emergent LTG: Patient will demonstrate awareness during functional activites type of (OT): (Goal downgraded based on slower progress than originally expected.) Minimal Assistance - Patient > 75% Note: Goal downgraded based on slower progress than originally expected.

## 2020-01-30 NOTE — Progress Notes (Signed)
Speech Language Pathology Daily Session Note  Patient Details  Name: Julie Harmon MRN: [id number] Date of Birth: January 25, 1956  Today's Date: 01/30/2020 SLP Individual Time: 1344-1443 SLP Individual Time Calculation (min): 59 min  Short Term Goals: Week 2: SLP Short Term Goal 1 (Week 2): STG=LTG due to remaining length of stay  Skilled Therapeutic Interventions:Skilled ST services focused on cognitive skills.  Pt was orientated to place, situation and time expect for day/date when provided visual aids. Pt demonstrated recall of x1 out 2 events from yesterday's ST services and was able to read memory notebbok with mod A due to visison deficits (glasses not present.) Pt requested a Sprite and when SLP went to get it from the nurses station, pt set the bed alarm off. Pt stated she had just picked up her feet, but upon questioning continues to demonstrate reduced safety awareness/awareness of deficits. Pt stated " course I can go to the bathroom alone." "my sister is not going to be running after me all the time." SLP provided education pertaining to physical and cognitive deficits impacting safety. Further education is needed with pt and pt's sister whom she will be discharging with. SLP facilitated basic problem solving and error awareness skills with familiar PEG design task, pt required max A for planning pattern and mod A fading to min A verbal cues  93/[address]th way through task) to continue basic pattern and awareness of errors. Pt requested to use the bathroom. Pt required mod A for sit to stand and mod A verbal cues for problem solving when attempting to pivot to and from the WC/toliet. Pt demonstrated momentary safety awareness upon reflect of the level fo required assistance, however pt then continued to state " at my house I will do better." Pt was left with NT in bathroom. Pt was left in room with call bell within reach and bed alarm set. SLP recommends to continue skilled services.      Pain Pain  Assessment Pain Scale: 0-10 Pain Score: 0-No pain  Therapy/Group: Individual Therapy  Lennyn Gange  [employer] 01/30/2020, 7:59 AM

## 2020-01-30 NOTE — Progress Notes (Signed)
[place] PHYSICAL MEDICINE & REHABILITATION PROGRESS NOTE  Subjective/Complaints: No complaints. Believes she is being discharged home today. Had shower with OT Denies pain   ROS:- limited by cognition,          Objective: Vital Signs: Blood pressure 118/73, pulse 78, temperature 98.4 F (36.9 C), resp. rate 17, height 5\' 6"  (1.676 m), weight 55.3 kg, SpO2 94 %. No results found. No results for input(s): WBC, HGB, HCT, PLT in the last 72 hours. Recent Labs    01/30/20 0644  NA 140  K 4.7  CL 103  CO2 26  GLUCOSE 109*  BUN 23  CREATININE 0.92  CALCIUM 9.6    Physical Exam: BP 118/73 (BP Location: Left Arm)   Pulse 78   Temp 98.4 F (36.9 C)   Resp 17   Ht 5\' 6"  (1.676 m)   Wt 55.3 kg   SpO2 94%   BMI 19.68 kg/m      General: Alert and oriented x 3, No apparent distress HEENT: Head is normocephalic, atraumatic, PERRLA, EOMI, sclera anicteric, oral mucosa pink and moist, dentition intact, ext ear canals clear,  Neck: Supple without JVD or lymphadenopathy Heart: Reg rate and rhythm. No murmurs rubs or gallops Chest: CTA bilaterally without wheezes, rales, or rhonchi; no distress Abdomen: Soft, non-tender, non-distended, bowel sounds positive. Extremities: No clubbing, cyanosis, or edema. Pulses are 2+ Skin: Clean and intact without signs of breakdown  Oriented to person and place Motor: Grossly 4+ to 5/5 throughout, left upper extremity slightly weaker, stable Sensation reported as equal BUE    Assessment/Plan: 1. 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  Physiatrist is providing close team supervision and 24 hour management of active medical problems listed below.  Physiatrist and rehab team continue to assess barriers to discharge/monitor patient progress toward functional and medical goals  Care  Tool:  Bathing    Body parts bathed by patient: Right arm, Left arm, Abdomen, Front perineal area, Chest, Right upper leg, Buttocks, Left upper leg, Right lower leg, Left lower leg, Face         Bathing assist Assist Level: Contact Guard/Touching assist     Upper Body Dressing/Undressing Upper body dressing   What is the patient wearing?: Bra, Pull over shirt    Upper body assist Assist Level: Contact Guard/Touching assist    Lower Body Dressing/Undressing Lower body dressing      What is the patient wearing?: Pants, Underwear/pull up     Lower body assist Assist for lower body dressing: Contact Guard/Touching assist     Toileting Toileting    Toileting assist Assist for toileting: Supervision/Verbal cueing     Transfers Chair/bed transfer  Transfers assist     Chair/bed transfer assist level: Contact Guard/Touching assist     Locomotion Ambulation   Ambulation assist      Assist level: Minimal Assistance - Patient > 75% Assistive device: Hand held assist Max distance: 150'   Walk 10 feet activity   Assist     Assist level: Contact Guard/Touching assist Assistive device: Walker-rolling   Walk 50 feet activity   Assist    Assist level: Minimal Assistance - Patient > 75% Assistive device: Hand held assist    Walk 150 feet activity   Assist Walk 150 feet activity did not occur: Safety/medical concerns  Assist level: Minimal Assistance - Patient > 75% Assistive device:  Hand held assist    Walk 10 feet on uneven surface  activity   Assist Walk 10 feet on uneven surfaces activity did not occur: Safety/medical concerns   Assist level: Minimal Assistance - Patient > 75% Assistive device: Hand held assist   Wheelchair     Assist Will patient use wheelchair at discharge?: No (No long term goals per PT) Type of Wheelchair: Manual    Wheelchair assist level: Maximal Assistance - Patient 25 - 49% Max wheelchair distance: 50     Wheelchair 50 feet with 2 turns activity    Assist        Assist Level: Maximal Assistance - Patient 25 - 49%   Wheelchair 150 feet activity     Assist     Assist Level: Total Assistance - Patient < 25%      Medical Problem List and Plan: 1.  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 SLP  2.  Antithrombotics: -DVT/anticoagulation with history of DVT: Pradaxa started 9/9.             -antiplatelet therapy: N/A 3. Pain Management: OxyContin 20 mg every 12 hours (reportedly on OxyCR 40mg  q8  as needed ?PTA.  )- did ok with 20mg  dose, will try Oxy IR 20mg  q6 prn given prn use (not every day) - has confusion trying to minimize meds that can affect cognition. Pain is well controlled 4. Mood: Xanax 0.25 mg twice daily as needed             -antipsychotic agents: N/A 5. Neuropsych: This patient is capable of making decisions on her own behalf.  Telesitter for safety, disoriented and with personality changes likely CVA related will ask neuropsych to assess- sister now electing SNF placement  6. Skin/Wound Care: Routine skin checks 7. Fluids/Electrolytes/Nutrition: Routine in and outs  Hypokalemic on labs today 9/13   -start kdur bid- recheck K+ in am  8.  Atrial fibrillation.  Cardizem 300 mg daily as well as Toprol 100 mg daily.  Continue Pradaxa.    HR controlled  Vitals:   01/29/20 2039 01/30/20 0625  BP: (!) 103/47 118/73  Pulse: 78 78  Resp: 17 17  Temp: 98.2 F (36.8 C) 98.4 F (36.9 C)  SpO2: 96% 94%   9.  Hyperlipidemia.    Lipitor 10.  Tobacco abuse/COPD.  NicoDerm patch.  Provide counseling 11.  Leukocytosis  WBCs 11.2 on 9/8--> 10.6 9/13  Afebrile 12.  Nausea-?  Resolved    avoid medications on empty stomach 13.  Poor intake but has >500 unrecorded cal per day, monitor weekly weight 14.   Constipation ,now with daily BMs       LOS: 11 days A FACE TO FACE EVALUATION WAS PERFORMED  [address] Menendez 01/30/2020, 10:25 AM

## 2020-01-31 ENCOUNTER — Encounter (HOSPITAL_COMMUNITY): Payer: Medicare (Managed Care)

## 2020-01-31 ENCOUNTER — Ambulatory Visit (HOSPITAL_COMMUNITY): Payer: Medicare (Managed Care)

## 2020-01-31 ENCOUNTER — Inpatient Hospital Stay (HOSPITAL_COMMUNITY): Payer: Medicare (Managed Care)

## 2020-01-31 ENCOUNTER — Encounter (HOSPITAL_COMMUNITY): Payer: Medicare (Managed Care) | Admitting: Occupational Therapy

## 2020-01-31 DIAGNOSIS — I639 Cerebral infarction, unspecified: Secondary | ICD-10-CM

## 2020-01-31 LAB — BASIC METABOLIC PANEL
Anion gap: 5 (ref 5–15)
BUN: 21 mg/dL (ref 8–23)
CO2: 26 mmol/L (ref 22–32)
Calcium: 9.6 mg/dL (ref 8.9–10.3)
Chloride: 108 mmol/L (ref 98–111)
Creatinine, Ser: 1.43 mg/dL — ABNORMAL HIGH (ref 0.44–1.00)
GFR calc Af Amer: 45 mL/min — ABNORMAL LOW (ref >60)
GFR calc non Af Amer: 39 mL/min — ABNORMAL LOW (ref >60)
Glucose, Bld: 126 mg/dL — ABNORMAL HIGH (ref 70–99)
Potassium: 4.9 mmol/L (ref 3.5–5.1)
Sodium: 139 mmol/L (ref 135–145)

## 2020-01-31 MED ORDER — OXYCODONE HCL 5 MG PO TABS
10.0000 mg | ORAL_TABLET | Freq: Three times a day (TID) | ORAL | Status: DC | PRN
  Administered 2020-01-31 – 2020-02-01 (×2): 10 mg via ORAL
  Filled 2020-01-31 (×2): qty 2

## 2020-01-31 NOTE — Progress Notes (Signed)
Physical Therapy Discharge Summary  Patient Details  Name: Julie Harmon MRN: [id number] Date of Birth: 1955/11/30    Patient has met 5 of 9 long term goals due to improved activity tolerance, improved balance, improved postural control, increased strength, ability to compensate for deficits, functional use of  right upper extremity, right lower extremity, left upper extremity and left lower extremity and improved coordination.  Patient to discharge at an ambulatory level [name].   Patient's care partner is independent to provide the necessary physical and cognitive assistance at discharge.  Reasons goals not met: Patient limited in her ability to achieve all goals due to poor cognition/attention, multiple refusals for therapy and inability to safely get down onto floor due to B knee pain to practice floor transfers.  Recommendation:  Patient will benefit from ongoing skilled PT services in home health setting to continue to advance safe functional mobility, address ongoing impairments in gait pattern, general strength, balance, coordination, and minimize fall risk.  Equipment: No equipment provided  Reasons for discharge: treatment goals met and discharge from hospital  Patient/family agrees with progress made and goals achieved: Yes   Patient has made slow, but steady progress toward her goals. She is grossly SPV/CGA for functional mobility including stair negotiation. Patient maintains Parkinson's-like gait pattern with festinating and shuffling gait, especially with turns and transitioning surfaces as well as flexed posture. She has difficulty motor planning transfers independently. Sister was present for family training and was able to demonstrate safety and competency with guarding and assisting patient with functional mobility.   PT Discharge Precautions/Restrictions Precautions Precautions: Fall Precaution Comments: shuffling gait Restrictions Weight Bearing Restrictions:  No Vision/Perception  Perception Perception: Within Functional Limits Praxis Praxis: Impaired Praxis Impairment Details: Perseveration  Cognition Overall Cognitive Status: Impaired/Different from baseline Arousal/Alertness: Awake/alert Orientation Level: Oriented to person;Oriented to place Problem Solving: Impaired Reasoning: Impaired Safety/Judgment: Impaired Sensation Sensation Light Touch: Appears Intact Hot/Cold: Not tested Proprioception: Appears Intact Stereognosis: Not tested Coordination Gross Motor Movements are Fluid and Coordinated: No Fine Motor Movements are Fluid and Coordinated: No Coordination and Movement Description: generalized weakness, shuffling/festinating gait and freezing of gait Heel Shin Test: mild ROM deficits and poor carryover of instruction, Motor  Motor Motor: Motor impersistence;Motor perseverations;Abnormal tone Motor - Discharge Observations: truncal rigidity, generalized weakness, motor perseverations  Mobility Bed Mobility Bed Mobility: Rolling Right;Rolling Left;Supine to Sit;Sit to Supine;Sitting - Scoot to Edge of Bed Rolling Right: Independent Rolling Left: Independent Supine to Sit: Independent Sitting - Scoot to [employer] of Bed: Independent Sit to Supine: Independent Transfers Transfers: Sit to Stand;Stand to [employer] Transfers Sit to Stand: Supervision/Verbal cueing Stand to Sit: Supervision/Verbal cueing Stand Pivot Transfers: Contact Guard/Touching assist Stand Pivot Transfer Details: Verbal cues for technique;Verbal cues for precautions/safety;Verbal cues for sequencing;Visual cues/gestures for sequencing Transfer (Assistive device): 1 person hand held assist Locomotion  Gait Ambulation: Yes Gait Assistance: Contact Guard/Touching assist Gait Distance (Feet): 150 Feet Assistive device: 1 person hand held assist Gait Assistance Details: Verbal cues for sequencing;Verbal cues for technique;Verbal cues for gait  pattern Gait Gait: Yes Gait Pattern: Impaired Gait Pattern: Right flexed knee in stance;Left flexed knee in stance;Step-to pattern;Decreased step length - left;Decreased step length - right;Narrow base of support;Shuffle;Trunk flexed Gait velocity: decreased Stairs / Additional Locomotion Stairs: Yes Stairs Assistance: Supervision/Verbal cueing Stair Management Technique: Two rails Number of Stairs: 4 Height of Stairs: 6 Ramp: Contact Guard/touching assist Curb: Contact Guard/Touching assist Wheelchair Mobility Wheelchair Mobility: No  Trunk/Postural Assessment  Cervical Assessment Cervical Assessment:  Exceptions to [facility] (forward head) Thoracic Assessment Thoracic Assessment:  (increased kyphosis) Lumbar Assessment Lumbar Assessment: Exceptions to [facility] (posterior pelvic tilt; increased hip flexor tone) Postural Control Postural Control: Deficits on evaluation Righting Reactions: delayed and inadequate with mild posterior bias  Balance Balance Balance Assessed: Yes Static Sitting Balance Static Sitting - Balance Support: Feet supported Static Sitting - Level of Assistance: 7: Independent Dynamic Sitting Balance Dynamic Sitting - Balance Support: Feet supported;During functional activity Dynamic Sitting - Level of Assistance: 5: Stand by assistance Static Standing Balance Static Standing - Balance Support: No upper extremity supported Static Standing - Level of Assistance: 5: Stand by assistance Dynamic Standing Balance Dynamic Standing - Balance Support: During functional activity;No upper extremity supported Dynamic Standing - Level of Assistance: 4: Min assist Extremity Assessment  RLE Assessment RLE Assessment: Exceptions to [facility] General Strength Comments: grossly 4+/5 except hip flexion 4/5; delayed activation throughout LLE Assessment LLE Assessment: Exceptions to [facility] General Strength Comments: grossly 4+/5 to 5/5    Debbora Dus 01/31/2020, 12:26 PM

## 2020-01-31 NOTE — Progress Notes (Signed)
Patient ID: Julie Harmon, female   DOB: 30-Sep-1955, 64 y.o.   MRN: [id number] Team Conference Report to Patient/Family  Team Conference discussion was reviewed with the patient and caregiver, including goals, any changes in plan of care and target discharge date.  Patient and caregiver express understanding and are in agreement.  The patient has a target discharge date of 02/01/20.  Andria Rhein 01/31/2020, 1:14 PM

## 2020-01-31 NOTE — Progress Notes (Signed)
Nutrition Follow-up  RD working remotely.  DOCUMENTATION CODES:   Severe malnutrition in context of acute illness/injury  INTERVENTION:   -d/c Ensure Enlive due to pt's continued refusal  - Magic Cup TID with meals, each supplement provides 290 kcal and 9 grams of protein  - double protein portions TID with meals  - MVI with minerals daily  - Encourageadequate intake of calories and protein at meals  NUTRITION DIAGNOSIS:   Severe Malnutrition related to acute illness (N/V/D, inadequate PO intake since CVA in June 2021) as evidenced by moderate fat depletion, severe muscle depletion, percent weight loss (18.7% weight loss in 3 months).  Ongoing  GOAL:   Patient will meet greater than or equal to 90% of their needs  Progressing  MONITOR:   PO intake, Supplement acceptance, Labs, Weight trends, I & O's  REASON FOR ASSESSMENT:   Consult Assessment of nutrition requirement/status  ASSESSMENT:   64 year old female with PMH of atrial fibrillation on Xarelto, CVA, COPD, depression, hyperthyroidism, recurrent DVTs s/p IVC filter. Pt admitted with acute small left embolic infarct. Therapies recommending CIR and pt admitted to CIR on 9/10.  Target d/c date is 9/23.  Discussed pt with RN. Per RN, pt eating much better but mostly orders eggs, bacon, and toast due to being very picky with food and beverages. RN reports pt does not like the taste of Ensure supplements and will not drink them. RD will d/c this order. Will order double protein portions TID with meals via [employer] diet software.  RD was unable to reach pt via phone call to room. Noted family education planned for today.  No new weights since 9/10.  Meal Completion: 50-100% x last 6 documented meals  Medications reviewed and include: MVI with minerals, miralax, Ensure Enlive BID, klor-con 20 mEq BID  Labs reviewed.  Diet Order:   Diet Order            Diet regular Room service appropriate? Yes with  Assist; Fluid consistency: Thin  Diet effective now                 EDUCATION NEEDS:   Not appropriate for education at this time  Skin:  Skin Assessment: Reviewed RN Assessment  Last BM:  01/30/20 small type 4  Height:   Ht Readings from Last 1 Encounters:  01/19/20 5\' 6"  (1.676 m)    Weight:   Wt Readings from Last 1 Encounters:  01/19/20 55.3 kg    Ideal Body Weight:  59.1 kg  BMI:  Body mass index is 19.68 kg/m.  Estimated Nutritional Needs:   Kcal:  1650-1850  Protein:  80-95 grams  Fluid:  1.6-1.8 L    03/20/20, MS, RD, LDN Inpatient Clinical Dietitian Please see [facility] for contact information.

## 2020-01-31 NOTE — Progress Notes (Signed)
Occupational Therapy Discharge Summary  Patient Details  Name: Julie Harmon MRN: [id number] Date of Birth: [birth year]  Today's Date: 01/31/2020 OT Individual Time: 1497-0263 OT Individual Time Calculation (min): 46 min    Session Note: Pt's sister in for education during session.  Pt initially with disorientation, not aware of month or time of day.  She wanted to call down to the cafeteria to order bacon for breakfast per her report.  When therapist questioned her wanting to order breakfast for tomorrow, she stated no "today".  Therapist had to re-direct her that it was already past lunch time and that breakfast would still be tomorrow.  Therapist had to assist her with making the call as she was not aware on the phone when talking to the receptionist to what she was trying to convey.  She transitioned to the EOB for donning her shoes, but needed mod instructional cueing to complete.  She then ambulated down to the pt dryer to gather he clothing up to bring back to the room and fold.  She continued to need min guard assist to min assist for balance secondary to increased posterior lean and shuffling gait pattern.  She was able to gather the clothing and carrying them back to the room.  Emphasized the need for her sister to have the gait belt in the pt during all mobility.  She was able to sit on the EOB and work on folding the items.  Pt with increased confusion regarding her clothing and perseverating on some items not being there.  Her sister was able to re-direct her.  Also discussed then need for pt to work on [place] tasks at home including selfcare tasks and for her sister to provide physical assist for all transfers and mobility.  She voiced understanding.  Pt was left sitting in the wheelchair working on her lunch in preparation for SLP session coming next.  Call button and phone in reach.   Patient has met 8 of 10 long term goals due to improved activity tolerance, improved balance, ability  to compensate for deficits, improved attention and improved awareness.  Patient to discharge at [facility] Assist level.  Patient's care partner is independent to provide the necessary physical and cognitive assistance at discharge.    Reasons goals not met: Pt needs min assist for UB dressing as well as min assist for dynamic standing balance.   Recommendation:  Patient will benefit from ongoing skilled OT services in home health setting to continue to advance functional skills in the area of BADL and Reduce care partner burden.  Pt continues to demonstrate decreased balance for all selfcare tasks as well as shuffling gait.  She demonstrated poor awareness of here deficits as well as decreased memory and cognitive processing.  Feel she will benefit from continued [place] and 24 hour supervision for safety to continue progression of ADL tasks and reduce burden of care.    Equipment: 3:1  Reasons for discharge: treatment goals met and discharge from hospital  Patient/family agrees with progress made and goals achieved: Yes  OT Discharge Precautions/Restrictions  Precautions Precautions: Fall Precaution Comments: shuffling gait Restrictions Weight Bearing Restrictions: No  Pain Pain Assessment Pain Scale: Faces Pain Score: 0-No pain ADL ADL Eating: Independent Where Assessed-Eating: Wheelchair Grooming: Supervision/safety Where Assessed-Grooming: Wheelchair Upper Body Bathing: Supervision/safety Where Assessed-Upper Body Bathing: Shower Lower Body Bathing: Contact guard Where Assessed-Lower Body Bathing: Shower Upper Body Dressing: Minimal assistance Where Assessed-Upper Body Dressing: Wheelchair Lower Body Dressing: Contact guard Where Assessed-Lower  Body Dressing: Wheelchair Toileting: Contact guard Where Assessed-Toileting: Bedside Commode Toilet Transfer: Therapist, music Method: Counselling psychologist: Geophysical data processor:  Curator Method: Heritage manager: Grab bars Vision Baseline Vision/History: Wears glasses Wears Glasses: At all times (has contacts) Patient Visual Report: Blurring of vision Vision Assessment?: Yes Eye Alignment: Within Functional Limits Ocular Range of Motion: Within Functional Limits Alignment/Gaze Preference: Within Defined Limits Tracking/Visual Pursuits: Decreased smoothness of horizontal tracking;Decreased smoothness of vertical tracking Saccades: Additional eye shifts occurred during testing;Additional head turns occurred during testing;Impaired - to be further tested in functional context Convergence: Within functional limits Visual Fields: No apparent deficits Perception  Perception: Within Functional Limits Praxis Praxis: Impaired Praxis Impairment Details: Perseveration Cognition Overall Cognitive Status: Impaired/Different from baseline Arousal/Alertness: Awake/alert Orientation Level: Oriented to person;Oriented to place;Oriented to situation;Disoriented to time Attention: Selective;Sustained Sustained Attention: Appears intact Selective Attention: Impaired Selective Attention Impairment: Functional basic;Verbal basic Memory: Impaired Memory Impairment: Storage deficit;Decreased recall of new information Decreased Short Term Memory: Verbal basic Awareness: Impaired Awareness Impairment: Intellectual impairment Problem Solving: Impaired Problem Solving Impairment: Verbal basic;Functional basic Reasoning: Impaired Reasoning Impairment: Verbal basic;Functional basic Self Monitoring: Impaired Self Monitoring Impairment: Verbal basic Safety/Judgment: Impaired Sensation Sensation Light Touch: Appears Intact Hot/Cold: Not tested Proprioception: Appears Intact Stereognosis: Appears Intact Coordination Gross Motor Movements are Fluid and Coordinated: No Fine Motor Movements are Fluid and Coordinated:  No Coordination and Movement Description: generalized weakness, shuffling/festinating gait and freezing of gait Heel Shin Test: mild ROM deficits and poor carryover of instruction, Motor  Motor Motor: Motor impersistence;Motor perseverations Motor - Discharge Observations: truncal rigidity, generalized weakness, motor perseverations Mobility  Bed Mobility Bed Mobility: Rolling Right;Rolling Left;Supine to Sit;Sit to Supine;Sitting - Scoot to [employer] of Bed Rolling Right: Independent Rolling Left: Independent Supine to Sit: Supervision/Verbal cueing Sitting - Scoot to Edge of Bed: Independent Sit to Supine: Supervision/Verbal cueing Transfers Sit to Stand: Contact Guard/Touching assist Stand to Sit: Contact Guard/Touching assist  Trunk/Postural Assessment  Cervical Assessment Cervical Assessment: Exceptions to [facility] (forward head) Thoracic Assessment Thoracic Assessment: Exceptions to [facility] (thoracic rounding) Lumbar Assessment Lumbar Assessment: Exceptions to [facility] (posterior pelvic tilt in sitting and standing) Postural Control Postural Control: Deficits on evaluation Righting Reactions: delayed and inadequate with mild posterior bias  Balance Balance Balance Assessed: Yes Static Sitting Balance Static Sitting - Balance Support: Feet supported Static Sitting - Level of Assistance: 7: Independent Dynamic Sitting Balance Dynamic Sitting - Balance Support: Feet supported;During functional activity Dynamic Sitting - Level of Assistance: 5: Stand by assistance Static Standing Balance Static Standing - Balance Support: No upper extremity supported Static Standing - Level of Assistance: 5: Stand by assistance Dynamic Standing Balance Dynamic Standing - Balance Support: During functional activity;No upper extremity supported Dynamic Standing - Level of Assistance: 4: Min assist Extremity/Trunk Assessment RUE Assessment RUE Assessment: Exceptions to [facility] General Strength Comments:  Coordination and speed slower bilaterally overall but strength 4/5 throughout.  needs increased time for tying shoes as well as assist for fastening buttons. LUE Assessment LUE Assessment: Exceptions to [facility] General Strength Comments: strength 4/5 throughout,Coordination and speed slower bilaterally overall but strength 4/5 throughout.  needs increased time for tying shoes as well as assist for fastening buttons. Mild tremor noted as well.   MCGUIRE,JAMES 01/31/2020, 4:49 PM

## 2020-01-31 NOTE — Discharge Instructions (Signed)
Inpatient Rehab Discharge Instructions  Julie Harmon Discharge date and time: No discharge date for patient encounter.   Activities/Precautions/ Functional Status: Activity: activity as tolerated Diet: regular diet Wound Care: none needed Functional status:  ___ No restrictions     ___ Walk up steps independently ___ 24/7 supervision/assistance   ___ Walk up steps with assistance ___ Intermittent supervision/assistance  ___ Bathe/dress independently ___ Walk with walker     _x__ Bathe/dress with assistance ___ Walk Independently    ___ Shower independently ___ Walk with assistance    ___ Shower with assistance ___ No alcohol     ___ Return to work/school ________  COMMUNITY REFERRALS UPON DISCHARGE:    Home Health:   PT     OT     ST                    Agency: [facility] Phone: [phone]    Special Instructions: Primary Care Follow up scheduled with Charlaine Dalton, FNP- C at [facility]. Appointment set March 25, 2020 at 10:00 AM.  No driving smoking or alcohol STROKE/TIA DISCHARGE INSTRUCTIONS SMOKING Cigarette smoking nearly doubles your risk of having a stroke & is the single most alterable risk factor  If you smoke or have smoked in the last 12 months, you are advised to quit smoking for your health.  Most of the excess cardiovascular risk related to smoking disappears within a year of stopping.  Ask you doctor about anti-smoking medications  [place] Quit Line: 1-800-QUIT NOW  Free Smoking Cessation Classes [phone]  CHOLESTEROL Know your levels; limit fat & cholesterol in your diet  Lipid Panel     Component Value Date/Time   CHOL 117 01/15/2020 0227   TRIG 47 01/15/2020 0227   HDL 37 (L) 01/15/2020 0227   CHOLHDL 3.2 01/15/2020 0227   VLDL 9 01/15/2020 0227   LDLCALC 71 01/15/2020 0227      Many patients benefit from treatment even if their cholesterol is at goal.  Goal: Total Cholesterol (CHOL) less than 160  Goal:   Triglycerides (TRIG) less than 150  Goal:  HDL greater than 40  Goal:  LDL (LDLCALC) less than 100   BLOOD PRESSURE American Stroke Association blood pressure target is less that 120/80 mm/Hg  Your discharge blood pressure is:  BP: (!) 106/58  Monitor your blood pressure  Limit your salt and alcohol intake  Many individuals will require more than one medication for high blood pressure  DIABETES (A1c is a blood sugar average for last 3 months) Goal HGBA1c is under 7% (HBGA1c is blood sugar average for last 3 months)  Diabetes: No known diagnosis of diabetes    Lab Results  Component Value Date   HGBA1C 5.8 (H) 01/15/2020     Your HGBA1c can be lowered with medications, healthy diet, and exercise.  Check your blood sugar as directed by your physician  Call your physician if you experience unexplained or low blood sugars.  PHYSICAL ACTIVITY/REHABILITATION Goal is 30 minutes at least 4 days per week  Activity: Increase activity slowly, Therapies: Physical Therapy: Home Health Return to work:   Activity decreases your risk of heart attack and stroke and makes your heart stronger.  It helps control your weight and blood pressure; helps you relax and can improve your mood.  Participate in a regular exercise program.  Talk with your doctor about the best form of exercise for you (dancing, walking, swimming, cycling).  DIET/WEIGHT  Goal is to maintain a healthy weight  Your discharge diet is:  Diet Order            Diet regular Room service appropriate? Yes with Assist; Fluid consistency: Thin  Diet effective now                 liquids Your height is:  Height: 5\' 6"  (167.6 cm) Your current weight is: Weight: 55.3 kg Your Body Mass Index (BMI) is:  BMI (Calculated): 19.69  Following the type of diet specifically designed for you will help prevent another stroke.  Your goal weight range is:    Your goal Body Mass Index (BMI) is 19-24.  Healthy food habits can help reduce 3  risk factors for stroke:  High cholesterol, hypertension, and excess weight.  RESOURCES Stroke/Support Group:  Call [phone]   STROKE EDUCATION PROVIDED/REVIEWED AND GIVEN TO PATIENT Stroke warning signs and symptoms How to activate emergency medical system (call 911). Medications prescribed at discharge. Need for follow-up after discharge. Personal risk factors for stroke. Pneumonia vaccine given:  Flu vaccine given:  My questions have been answered, the writing is legible, and I understand these instructions.  I will adhere to these goals & educational materials that have been provided to me after my discharge from the hospital.      My questions have been answered and I understand these instructions. I will adhere to these goals and the provided educational materials after my discharge from the hospital.  Patient/Caregiver Signature _______________________________ Date __________  Clinician Signature _______________________________________ Date __________  Please bring this form and your medication list with you to all your follow-up doctor's appointments. Information on my medicine         - Pradaxa (dabigatran)  This medication education was reviewed with me or my healthcare representative as part of my discharge preparation.     Why was Pradaxa prescribed for you? Pradaxa was prescribed for you to reduce the risk of forming blood clots that cause a stroke if you have a medical condition called atrial fibrillation (a type of irregular heartbeat).    What do you Need to know about PradAXa? Take your Pradaxa TWICE DAILY - one capsule in the morning and one tablet in the evening with or without food.  It would be best to take the doses about the same time each day.  The capsules should not be broken, chewed or opened - they must be swallowed whole.  Do not store Pradaxa in other medication containers - once the bottle is opened the Pradaxa should be used within  FOUR months; throw away any capsules that haven't been by that time.  Take Pradaxa exactly as prescribed by your doctor.  DO NOT stop taking Pradaxa without talking to the doctor who prescribed the medication.  Stopping without other stroke prevention medication to take the place of Pradaxa may increase your risk of developing a clot that causes a stroke.  Refill your prescription before you run out.  After discharge, you should have regular check-up appointments with your healthcare provider that is prescribing your Pradaxa.  In the future your dose may need to be changed if your kidney function or weight changes by a significant amount.  What do you do if you miss a dose? If you miss a dose, take it as soon as you remember on the same day.  If your next dose is less than 6 hours away, skip the missed dose.  Do not take two doses of  PRADAXA at the same time.  Important Safety Information A possible side effect of Pradaxa is bleeding. You should call your healthcare provider right away if you experience any of the following: ? Bleeding from an injury or your nose that does not stop. ? Unusual colored urine (red or dark brown) or unusual colored stools (red or black). ? Unusual bruising for unknown reasons. ? A serious fall or if you hit your head (even if there is no bleeding).  Some medicines may interact with Pradaxa and might increase your risk of bleeding or clotting while on Pradaxa. To help avoid this, consult your healthcare provider or pharmacist prior to using any new prescription or non-prescription medications, including herbals, vitamins, non-steroidal anti-inflammatory drugs (NSAIDs) and supplements.  This website has more information on Pradaxa (dabigatran): [email or web address].com

## 2020-01-31 NOTE — Patient Care Conference (Signed)
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 Claudette Laws Care Coodinator Present: Chana Bode, RN, BSN, CRRN;Christina Vita Barley, BSW Nurse Present: Margot Ables, LPN PT Present: Other (comment) Merry Lofty, PT) OT Present: Perrin Maltese, OT SLP Present: Colin Benton, SLP PPS Coordinator present : Fae Pippin, SLP     Current Status/Progress Goal Weekly Team Focus  Bowel/Bladder   continent of B&B; LBM 9/21  remain continent  assess q shift/prn   Swallow/Nutrition/ Hydration             ADL's   min assist for UB dressing with supervision to min guard for LB selfcare.  Min guard for toilet transfers as well as shower tub transfers  Downgraded to min guard overall  Selfcare retraining, balance retraining, DME education, cognitive retraining, transfer training, pt/family education   Mobility   SPV for bed mobility, MinA-CGA for STS and stand pivot transfers using HHA, MinA gait up to 178ft using HHA, MinA 1 step. Patient refusing to use FWW at times and multiple refusals for therapy  supervision overall  therapy participation, dynamic standing balance, gait   Communication   Min A word findng   Min A - downgraded 9/21  education, word finding strategies and awareness of errors   Safety/Cognition/ Behavioral Observations  Min-Mod A  Min A, Mod A emergent -downgraded 9/21  education, basic problem solving, emergent awareness, sustained/selective attention, orientation and recall with aids   Pain   c/o pain in back and headache 8/10  pain < 3  assess pain q shift/prn   Skin   skin intact  remain free of breakdown   assess skin q shift/prn     Discharge Planning:  Discharging home on Friday with sister. DME and HH set. Family education 1-3PM   Team Discussion: Discussion of confusion over discharge destination and support at discharge. Patient's story does not match. Continue to note cognitive deficits especially related to time.  BP contolled. Hypokalemia addressed per MD.  MD able to wean off oxycontin and now using oxycodone prn. Continent of bowel and bladder.  Anxious to be discharged but continues to display shuffled gait and flexed posture, poor awareness, persevorates and can become argumentative when corrected. Poor motor planning when using the RW.  Patient on target to meet rehab goals: No, currently supervision for upper body care and min guard for lower body care. Min assist for transfers due to gait and trouble with RW.  *See Care Plan and progress notes for long and short-term goals.   Revisions to Treatment Plan:  SLP focus on communication, orientation, reading, problem solving. Goals were downgraded to min assist. Teaching Needs: Transfers, safety, medications, etc.  Current Barriers to Discharge: Decreased caregiver support,  Behavior and cognition and poor safety awareness  Possible Resolutions to Barriers: Family education 01/31/20 Recommend follow up [facility] = SW secured [facility]    Medical Summary Current Status: pt disoriented to time, pt taking oxyIR 20mg  per day  Barriers to Discharge: Medical stability   Possible Resolutions to Focus:  cont to wean oxyIR   Continued Need for Acute Rehabilitation Level of Care: The patient requires daily medical management by a physician with specialized training in physical medicine and rehabilitation for the following reasons: Direction of a multidisciplinary physical rehabilitation program to maximize functional independence : Yes Medical management of patient stability for increased activity during participation in an intensive rehabilitation regime.: Yes Analysis of laboratory values and/or radiology reports with any subsequent need for medication adjustment and/or medical intervention. : Yes   I attest that I was present, lead the team conference, and concur with the assessment and plan of the team.   [employer] B 01/31/2020, 2:12 PM

## 2020-01-31 NOTE — Progress Notes (Signed)
Speech Language Pathology Discharge Summary  Patient Details  Name: Julie Harmon MRN: [id number] Date of Birth: 1955/08/17  Today's Date: 01/31/2020 SLP Individual Time: 1400-1430 SLP Individual Time Calculation (min): 30 min   Skilled Therapeutic Interventions:   Skilled ST services focused on education and cognitive skills. Pt's sister was present for education. SLP facilitated assessment of cognitive linguistic skills administering SLUMS, pt scored 5 out 30 (n=>27.) SLP provided education pertaining to deficits in attention, memory and awareness impacting safety awareness, word finding and basic problem solving. Pt's sister sated she felt confident being able to provided cognitive support. All questions answered to satisfaction. Pt was left in room with sister, call bell within reach and bed alarm set. SLP recommends to continue skilled services.     Patient has met 6 of 6 long term goals.  Patient to discharge at overall Min;Mod level.  Reasons goals not met:     Clinical Impression/Discharge Summary:   Pt met 6 out 6 goals discharging at min-mod A for basic tasks. Pt demonstrated inconsistent performance of skills ranging from max-min A. Pt demonstrated improvement in expressive language and word finding abilities, however cognitive impairments continue to impact language. Pt's primary deficits are in cognitive nature, including reduced sustained attention, reduced intellectual/emergent awareness, reduce short term/immediate recall, and reduce basic problem solving. Pt is at risk of fall and has severe reduced safety awareness. Education was completed with pt's sister and all questions answered to satisfaction. Pt benefited from skilled ST services to maximize functional independence and reduce burden of care, requiring 24 hour supervision A and continue ST services.   Care Partner:  Caregiver Able to Provide Assistance: Yes  Type of Caregiver Assistance:  Physical;Cognitive  Recommendation:  24 hour supervision/assistance;Outpatient SLP  Rationale for SLP Follow Up: Maximize functional communication;Maximize cognitive function and independence;Reduce caregiver burden   Equipment: N/A   Reasons for discharge: Discharged from hospital   Patient/Family Agrees with Progress Made and Goals Achieved: Yes    Emelda Kohlbeck  [facility] 01/31/2020, 5:19 PM

## 2020-01-31 NOTE — Progress Notes (Signed)
Occupational Therapy Session Note  Patient Details  Name: Julie Harmon MRN: [id number] Date of Birth: 05/29/1955  Today's Date: 01/31/2020 OT Individual Time: 0945-1100 OT Individual Time Calculation (min): 75 min    Short Term Goals: Week 2:  OT Short Term Goal 1 (Week 2): Continue working on established LTGs set at supervision level overall.  Skilled Therapeutic Interventions/Progress Updates:    Pt received supine, upset about sister not coming. Assisted pt in attempting to call sister, she did not answer and a message was left for her including pt's room phone number. Pt's memory notebook was initiated for the day and she was cued to use external aids for orientation. Pt oriented to year only. Pt completed bed mobility with (S) but increased time/effort. Pt required redirection/cueing for participation. Pt completed ambulatory transfer around room to gather items to wash clothes. CGA provided with short, shuffling gait. Pt then completed ambulatory bathroom transfer, completing all toileting tasks with (S). Pt completed oral care at the sink in standing with (S). She was taken via w/c to the laundry room where pt loaded her clothes into the washing machine and with min cueing was able to choose settings to wash clothes. Pt requested pain medication for 5/10 leg pain (chronic) and RN administered pain medication. Pt given rest break and discussed d/c. Pt completed 3 sets of standing reciprocal stepping activity using a 10 in block to encourage more AROM at the hip and to challenge dynamic standing balance. Min A fading to CGA provided as well as min cueing for encouragement. Pt reporting increase in knee pain and requested to not stand anymore. Pt completed 3x 10 shoulder and chest press with 3lb dowel. Cueing provided for technique and muscle activation. Pt returned to her room and was left supine with all needs met, bed alarm set.   Therapy Documentation Precautions:  Precautions Precautions:  Fall Precaution Comments: shuffling gait Restrictions Weight Bearing Restrictions: No   Therapy/Group: Individual Therapy  Sandra H Davis 01/31/2020, 12:38 PM 
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
## 2020-01-31 NOTE — Progress Notes (Signed)
Physical Therapy Session Note  Patient Details  Name: Julie Harmon MRN: [id number] Date of Birth: 11-25-1955  Today's Date: 01/31/2020 PT Individual Time: 1430-1528 PT Individual Time Calculation (min): 58 min   Short Term Goals: Week 2:  PT Short Term Goal 1 (Week 2): = to LTGs based on ELOS  Skilled Therapeutic Interventions/Progress Updates:    Patient received sitting up in wc with sister present at bedside for family tx. She is agreeable to PT with encouragement from PT and sister and denies pain at this moment. PT propelling patient to therapy gym for time management. Discussed level of assist that patient requires for STS, transfers, gait and stairs with sister. Discussed gait presentation with sister and increased risk for falling related to shuffling/festinating gait pattern, especially with turns. Patient able to ambulate 131ft x2. First with FWW and 2nd without to demonstrate difference. Patient stating that she won't be using FWW in the house. Sister able to demonstrate competency with guarding patient when ambulating. Patient negotiating 4 stairs with B HR use and close supervision. She was able to transfer into SUV-height car with supervision and verbal cues for motor planning. Patient returning to room in wc requesting to get into bed. CGA provided for gait to bed. PT providing patient and sister with HEP and access to further patient education recommending that patient follow up with OP PT re: LSVT BIG program. Sister and patient without further questions at this time- she is safe to dc home with sister and 24/7 supervision. Bed alarm on, call light within reach.   Therapy Documentation Precautions:  Precautions Precautions: Fall Precaution Comments: shuffling gait Restrictions Weight Bearing Restrictions: No    Therapy/Group: Individual Therapy  Elizebeth Koller, PT, DPT, CBIS 01/31/2020, 7:39 AM

## 2020-01-31 NOTE — Progress Notes (Signed)
[place] PHYSICAL MEDICINE & REHABILITATION PROGRESS NOTE  Subjective/Complaints:  Denies pain   ROS:- limited by cognition,          Objective: Vital Signs: Blood pressure (!) 104/54, pulse 81, temperature 97.7 F (36.5 C), temperature source Oral, resp. rate 18, height 5\' 6"  (1.676 m), weight 55.3 kg, SpO2 96 %. No results found. No results for input(s): WBC, HGB, HCT, PLT in the last 72 hours. Recent Labs    01/30/20 0644  NA 140  K 4.7  CL 103  CO2 26  GLUCOSE 109*  BUN 23  CREATININE 0.92  CALCIUM 9.6    Physical Exam: BP (!) 104/54 (BP Location: Left Arm)   Pulse 81   Temp 97.7 F (36.5 C) (Oral)   Resp 18   Ht 5\' 6"  (1.676 m)   Wt 55.3 kg   SpO2 96%   BMI 19.68 kg/m      General: Alert and oriented x 3, No apparent distress HEENT: Head is normocephalic, atraumatic, PERRLA, EOMI, sclera anicteric, oral mucosa pink and moist, dentition intact, ext ear canals clear,  Neck: Supple without JVD or lymphadenopathy Heart: Reg rate and rhythm. No murmurs rubs or gallops Chest: CTA bilaterally without wheezes, rales, or rhonchi; no distress Abdomen: Soft, non-tender, non-distended, bowel sounds positive. Extremities: No clubbing, cyanosis, or edema. Pulses are 2+ Skin: Clean and intact without signs of breakdown  Oriented to person and place Motor: Grossly 4+ to 5/5 throughout, left upper extremity slightly weaker, stable Sensation reported as equal BUE    Assessment/Plan: 1. 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  Physiatrist is providing close team supervision and 24 hour management of active medical problems listed below.  Physiatrist and rehab team continue to assess barriers to discharge/monitor patient progress toward functional and medical goals  Care Tool:  Bathing    Body parts bathed by  patient: Right arm, Left arm, Abdomen, Front perineal area, Chest, Right upper leg, Buttocks, Left upper leg, Right lower leg, Left lower leg, Face         Bathing assist Assist Level: Contact Guard/Touching assist     Upper Body Dressing/Undressing Upper body dressing   What is the patient wearing?: Bra, Pull over shirt    Upper body assist Assist Level: Contact Guard/Touching assist    Lower Body Dressing/Undressing Lower body dressing      What is the patient wearing?: Pants, Underwear/pull up     Lower body assist Assist for lower body dressing: Contact Guard/Touching assist     Toileting Toileting    Toileting assist Assist for toileting: Supervision/Verbal cueing     Transfers Chair/bed transfer  Transfers assist     Chair/bed transfer assist level: Contact Guard/Touching assist     Locomotion Ambulation   Ambulation assist      Assist level: Minimal Assistance - Patient > 75% Assistive device: Hand held assist Max distance: 150'   Walk 10 feet activity   Assist     Assist level: Contact Guard/Touching assist Assistive device: Walker-rolling   Walk 50 feet activity   Assist    Assist level: Minimal Assistance - Patient > 75% Assistive device: Hand held assist    Walk 150 feet activity   Assist Walk 150 feet activity did not occur: Safety/medical concerns  Assist level: Minimal Assistance - Patient > 75% Assistive device: Hand held assist  Walk 10 feet on uneven surface  activity   Assist Walk 10 feet on uneven surfaces activity did not occur: Safety/medical concerns   Assist level: Minimal Assistance - Patient > 75% Assistive device: Hand held assist   Wheelchair     Assist Will patient use wheelchair at discharge?: No (No long term goals per PT) Type of Wheelchair: Manual    Wheelchair assist level: Maximal Assistance - Patient 25 - 49% Max wheelchair distance: 50    Wheelchair 50 feet with 2 turns  activity    Assist        Assist Level: Maximal Assistance - Patient 25 - 49%   Wheelchair 150 feet activity     Assist     Assist Level: Total Assistance - Patient < 25%      Medical Problem List and Plan: 1.  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 SLP  2.  Antithrombotics: -DVT/anticoagulation with history of DVT: Pradaxa started 9/9.             -antiplatelet therapy: N/A 3. Pain Management: OxyContin 20 mg every 12 hours (reportedly on OxyCR 40mg  q8  as needed ?PTA.  )- did ok with 20mg  dose, will try Oxy IR 20mg  q6 prn given prn use (not every day) - has confusion trying to minimize meds that can affect cognition. Pain is well controlled- takes Oxy IR 20mg   Daily, will try to reduce to 10mg   4. Mood: Xanax 0.25 mg twice daily as needed             -antipsychotic agents: N/A 5. Neuropsych: This patient is capable of making decisions on her own behalf.  Telesitter for safety, disoriented and with personality changes likely CVA related will ask neuropsych to assess- sister now electing SNF placement  6. Skin/Wound Care: Routine skin checks 7. Fluids/Electrolytes/Nutrition: Routine in and outs  Hypokalemic on labs today 9/13   -start kdur bid- recheck K+ in am  8.  Atrial fibrillation.  Cardizem 300 mg daily as well as Toprol 100 mg daily.  Continue Pradaxa.    HR controlled  Vitals:   01/30/20 2020 01/31/20 0548  BP: 107/71 (!) 104/54  Pulse: 86 81  Resp: 18 18  Temp: 97.7 F (36.5 C) 97.7 F (36.5 C)  SpO2: 97% 96%   9.  Hyperlipidemia.    Lipitor 10.  Tobacco abuse/COPD.  NicoDerm patch.  Provide counseling 11.  Leukocytosis  WBCs 11.2 on 9/8--> 10.6 9/13  Afebrile 12.  Nausea-?  Resolved    avoid medications on empty stomach 13.  Poor intake but has >500 unrecorded cal per day, monitor weekly  weight 14.  Constipation ,now with daily BMs       LOS: 12 days A FACE TO FACE EVALUATION WAS PERFORMED  10/13 01/31/2020, 10:33 AM

## 2020-02-01 DIAGNOSIS — I639 Cerebral infarction, unspecified: Secondary | ICD-10-CM

## 2020-02-01 MED ORDER — METOPROLOL SUCCINATE ER 50 MG PO TB24: 50.0000 mg | ORAL_TABLET | Freq: Every day | ORAL | 0 refills | Status: AC

## 2020-02-01 MED ORDER — DILTIAZEM HCL ER COATED BEADS 300 MG PO CP24: 300.0000 mg | ORAL_CAPSULE | Freq: Every day | ORAL | 0 refills | Status: AC

## 2020-02-01 MED ORDER — ADULT MULTIVITAMIN W/MINERALS CH: 1.0000 | ORAL_TABLET | Freq: Every day | ORAL | Status: AC

## 2020-02-01 MED ORDER — NICOTINE 14 MG/24HR TD PT24: MEDICATED_PATCH | TRANSDERMAL | 0 refills | Status: AC

## 2020-02-01 MED ORDER — ATORVASTATIN CALCIUM 40 MG PO TABS: 40.0000 mg | ORAL_TABLET | Freq: Every day | ORAL | 0 refills | Status: AC

## 2020-02-01 MED ORDER — ALPRAZOLAM 0.25 MG PO TABS: 0.2500 mg | ORAL_TABLET | Freq: Two times a day (BID) | ORAL | 0 refills | Status: DC | PRN

## 2020-02-01 MED ORDER — POLYETHYLENE GLYCOL 3350 17 G PO PACK: 17.0000 g | PACK | Freq: Two times a day (BID) | ORAL | 0 refills | Status: AC

## 2020-02-01 MED ORDER — ACETAMINOPHEN 325 MG PO TABS: 650.0000 mg | ORAL_TABLET | ORAL | Status: AC | PRN

## 2020-02-01 MED ORDER — DABIGATRAN ETEXILATE MESYLATE 150 MG PO CAPS: 150.0000 mg | ORAL_CAPSULE | Freq: Two times a day (BID) | ORAL | 0 refills | Status: AC

## 2020-02-01 MED ORDER — OXYCODONE HCL 10 MG PO TABS
10.0000 mg | ORAL_TABLET | Freq: Three times a day (TID) | ORAL | 0 refills | Status: DC | PRN
Start: 2020-02-01 — End: 2020-02-15

## 2020-02-01 MED FILL — ALPRAZolam 0.25 MG TABS: 0.25 | 15 days supply | Qty: 30 | Fill #0

## 2020-02-01 MED FILL — PRADAXA 150 MG CAP: 150 | 30 days supply | Qty: 60 | Fill #0

## 2020-02-01 MED FILL — oxyCODONE HCL 10 MG TABS: 10 | 7 days supply | Qty: 20 | Fill #0

## 2020-02-01 MED FILL — ATORVASTATIN CALCIUM 40 MG: 40 | 30 days supply | Qty: 30 | Fill #0

## 2020-02-01 MED FILL — CARTIA XT 300 MG CAPSULE SA: 300 | 30 days supply | Qty: 30 | Fill #0

## 2020-02-01 MED FILL — METOPROLOL SUCCINATE ER 50: 50 | 30 days supply | Qty: 30 | Fill #0

## 2020-02-01 NOTE — Progress Notes (Signed)
Inpatient Rehabilitation Care Coordinator  Discharge Note  The overall goal for the admission was met for:   Discharge location: Yes, Home W/ Sister  Length of Stay: Yes, 13 Days  Discharge activity level: Yes, ambulatory Min A  Home/community participation: Yes  Services provided included: MD, RD, PT, OT, SLP, RN, CM, TR, Pharmacy, [facility]: Private Insurance: [facility] Medicare  Follow-up services arranged: Home Health: [facility]   Comments (or additional information): PT OT ST  Patient/Family verbalized understanding of follow-up arrangements: Yes  Individual responsible for coordination of the follow-up plan: Jackelyn Poling [phone]  Confirmed correct DME delivered: Dyanne Iha 02/01/2020    Dyanne Iha

## 2020-02-01 NOTE — Plan of Care (Signed)
  Problem: Consults Goal: RH STROKE PATIENT EDUCATION Description: See Patient Education module for education specifics  Outcome: Completed/Met   Problem: RH BLADDER ELIMINATION Goal: RH STG MANAGE BLADDER WITH ASSISTANCE Description: STG Manage Bladder With mod I Assistance Outcome: Completed/Met   Problem: RH SAFETY Goal: RH STG ADHERE TO SAFETY PRECAUTIONS W/ASSISTANCE/DEVICE Description: STG Adhere to Safety Precautions With  cues/reminders Assistance/Device. Outcome: Completed/Met   Problem: RH PAIN MANAGEMENT Goal: RH STG PAIN MANAGED AT OR BELOW PT'S PAIN GOAL Description: At or below level 4 Outcome: Completed/Met   Problem: RH KNOWLEDGE DEFICIT Goal: RH STG INCREASE KNOWLEDGE OF HYPERTENSION Description: Patient will be able to manage HTN using medications and dietary changes and educational materials with cues/reminders Outcome: Completed/Met Goal: RH STG INCREASE KNOWLEGDE OF HYPERLIPIDEMIA Description: Patient will be able to manage HLD using medications and dietary changes and educational materials with cues/reminders Outcome: Completed/Met Goal: RH STG INCREASE KNOWLEDGE OF STROKE PROPHYLAXIS Description: Patient will be able to manage secondary stroke prevention using medications and dietary changes and educational materials with cues/reminders Outcome: Completed/Met

## 2020-02-01 NOTE — Progress Notes (Signed)
[place] PHYSICAL MEDICINE & REHABILITATION PROGRESS NOTE  Subjective/Complaints:  Denies pain, no increased pain c/o since weaning off oxyCR 40mg  Q 8, now taking Oxy IR 10mg  ~1-2 x per day    ROS:- limited by cognition,          Objective: Vital Signs: Blood pressure (!) 102/53, pulse 82, temperature 97.8 F (36.6 C), resp. rate 18, height 5\' 6"  (1.676 m), weight 55.3 kg, SpO2 94 %. No results found. No results for input(s): WBC, HGB, HCT, PLT in the last 72 hours. Recent Labs    01/30/20 0644 01/31/20 1508  NA 140 139  K 4.7 4.9  CL 103 108  CO2 26 26  GLUCOSE 109* 126*  BUN 23 21  CREATININE 0.92 1.43*  CALCIUM 9.6 9.6    Physical Exam: BP (!) 102/53 (BP Location: Left Arm)   Pulse 82   Temp 97.8 F (36.6 C)   Resp 18   Ht 5\' 6"  (1.676 m)   Wt 55.3 kg   SpO2 94%   BMI 19.68 kg/m    General: No acute distress Mood and affect are appropriate Heart: Regular rate and rhythm no rubs murmurs or extra sounds Lungs: Clear to auscultation, breathing unlabored, no rales or wheezes Abdomen: Positive bowel sounds, soft nontender to palpation, nondistended Extremities: No clubbing, cyanosis, or edema Skin: No evidence of breakdown, no evidence of rash  Oriented to person and place Motor: Grossly 4+ to 5/5 throughout, left upper extremity slightly weaker, stable Sensation reported as equal BUE    Assessment/Plan: 1. 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  Physiatrist is providing close team supervision and 24 hour management of active medical problems listed below.  Physiatrist and rehab team continue to assess barriers to discharge/monitor patient progress toward functional and medical goals  Care Tool:  Bathing    Body parts bathed by patient: Right arm, Left arm, Abdomen, Front perineal area, Chest, Right  upper leg, Buttocks, Left upper leg, Right lower leg, Left lower leg, Face         Bathing assist Assist Level: Contact Guard/Touching assist     Upper Body Dressing/Undressing Upper body dressing   What is the patient wearing?: Bra, Pull over shirt    Upper body assist Assist Level: Minimal Assistance - Patient > 75%    Lower Body Dressing/Undressing Lower body dressing      What is the patient wearing?: Pants, Underwear/pull up     Lower body assist Assist for lower body dressing: Contact Guard/Touching assist     Toileting Toileting    Toileting assist Assist for toileting: Contact Guard/Touching assist     Transfers Chair/bed transfer  Transfers assist     Chair/bed transfer assist level: Contact Guard/Touching assist     Locomotion Ambulation   Ambulation assist      Assist level: Minimal Assistance - Patient > 75% Assistive device: Hand held assist Max distance: 75'   Walk 10 feet activity   Assist     Assist level: Contact Guard/Touching assist Assistive device: Walker-rolling   Walk 50 feet activity   Assist    Assist level: Minimal Assistance - Patient > 75% Assistive device: Hand held assist    Walk 150 feet activity   Assist Walk 150 feet activity did not occur: Safety/medical concerns  Assist level: Minimal Assistance - Patient > 75% Assistive device: Hand held assist  Walk 10 feet on uneven surface  activity   Assist Walk 10 feet on uneven surfaces activity did not occur: Safety/medical concerns   Assist level: Minimal Assistance - Patient > 75% Assistive device: Hand held assist   Wheelchair     Assist Will patient use wheelchair at discharge?: No (No long term goals per PT) Type of Wheelchair: Manual    Wheelchair assist level: Maximal Assistance - Patient 25 - 49% Max wheelchair distance: 50    Wheelchair 50 feet with 2 turns activity    Assist        Assist Level: Maximal Assistance -  Patient 25 - 49%   Wheelchair 150 feet activity     Assist     Assist Level: Total Assistance - Patient < 25%      Medical Problem List and Plan: 1.  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 SLP  2.  Antithrombotics: -DVT/anticoagulation with history of DVT: Pradaxa started 9/9.             -antiplatelet therapy: N/A 3. Pain Management: OxyContin 20 mg every 12 hours (reportedly on OxyCR 40mg  q8  as needed ?PTA.  )- did ok with 20mg  dose, will try Oxy IR 20mg  q6 prn given prn use (not every day) - has confusion trying to minimize meds that can affect cognition.  will try Oxy IR 10mg  1-2 per day 4. Mood: Xanax 0.25 mg twice daily as needed             -antipsychotic agents: N/A 5. Neuropsych: This patient is capable of making decisions on her own behalf.  Xanax .25mg  BID prn  6. Skin/Wound Care: Routine skin checks 7. Fluids/Electrolytes/Nutrition: Routine in and outs  Hypokalemic on labs today 9/13   -start kdur bid- K+ is normal on current meds  8.  Atrial fibrillation.  Cardizem 300 mg daily as well as Toprol 100 mg daily.  Continue Pradaxa.    HR controlled  Vitals:   01/31/20 2029 02/01/20 0629  BP: 105/66 (!) 102/53  Pulse: 60 82  Resp: 17 18  Temp: 98.2 F (36.8 C) 97.8 F (36.6 C)  SpO2: 94%   f/u PCP- will get appt with local MD since she is from [place] 9.  Hyperlipidemia.    Lipitor 10.  Tobacco abuse/COPD.  NicoDerm patch.  Provide counseling 11.  Leukocytosis  WBCs 11.2 on 9/8--> 10.6 9/13  Afebrile 12.  Nausea-?  Resolved    avoid medications on empty stomach 13.  Poor intake but has >500 unrecorded cal per day, monitor weekly weight 14.  Constipation ,now with daily BMs       LOS: 13 days A FACE TO FACE EVALUATION WAS PERFORMED  02/01/2020, 7:45 AM

## 2020-02-01 NOTE — Progress Notes (Signed)
Patient discharged off of unit with all belongings. Discharge papers/instructions explained by physician assistant to family. Patient and family have no further questions at time of discharge. No complications noted at this time.  Yobany Vroom L Arista Kettlewell  

## 2020-02-02 NOTE — Progress Notes (Signed)
Patient ID: Julie Harmon, female   DOB: 05/08/1956, 64 y.o.   MRN: [id number]   Patient sister called with questions regarding follow up appointment and [facility]. Sister informed of all appointments and follow ups and instructed where to find on AVS.  Lavera Guise, BSW [phone]

## 2020-02-05 ENCOUNTER — Telehealth: Payer: Self-pay | Admitting: Registered Nurse

## 2020-02-05 DIAGNOSIS — R399 Unspecified symptoms and signs involving the genitourinary system: Secondary | ICD-10-CM

## 2020-02-05 NOTE — Telephone Encounter (Signed)
Transitional Care call Transitional Questions Answered by sister Julie Harmon  Patient name: Julie Harmon DOB: 04-12-1956 1. Are you/is patient experiencing any problems since coming home? Yes, Nausea, Vomiting and occasionally diarrhea. Ms. Julie Harmon reports Julie Harmon was receiving reglan while she was hospitalized,  She had to give her sister one of her phenergan, this will be discussed with Dr Wynn Banker in the morning, she verbalizes understanding. If Julie Harmon continues with N/V and diarrhea to take her to Urgent Care or ED for evaluation, she verbalizes understanding. Julie Harmon doesn't have a PCP, she lives in [place] . Her sister has tried to obtain a PCP, first available appointment is in November and she is suppose to return to her home in October. Julie Harmon also states Julie Harmon, UA/Urine Cultured ordered, she verbalizes understanding.  a. Are there any questions regarding any aspect of care? No 2. Are there any questions regarding medications administration/dosing? No a. Are meds being taken as prescribed? Yes b. "Patient should review meds with caller to confirm" Medication List Reviewed.  3. Have there been any falls? No 4. Has Home Health been to the house and/or have they contacted you? Yes, [facility]. a. If not, have you tried to contact them? NA b. Can we help you contact them? NA 5. Are bowels and bladder emptying properly? Yes a. Are there any unexpected incontinence issues? No b. If applicable, is patient following bowel/bladder programs? NA 6. Any fevers, problems with breathing, unexpected pain? Yes, Leg Pain, she reports she will follow up with her orthopedics she states.  7. Are there any skin problems or new areas of breakdown? No 8. Has the patient/family member arranged specialty MD follow up (ie cardiology/neurology/renal/surgical/etc.)?  She was instructed to F/U with [facility]   And she has an appointment scheduled with  PCP.  Can we help arrange? NA 9. Does the patient need any other services or support that we can help arrange? No 10. Are caregivers following through as expected in assisting the patient? Yes. 11. Has the patient quit smoking, drinking alcohol, or using drugs as recommended? (                        )  Appointment date/time 02/09/2020  arrival time 11:15 for 11:30 with Dr. Wynn Banker. At [address] [facility] [address]

## 2020-02-06 ENCOUNTER — Encounter: Payer: Self-pay | Admitting: Emergency Medicine

## 2020-02-06 DIAGNOSIS — Z681 Body mass index (BMI) 19 or less, adult: Secondary | ICD-10-CM

## 2020-02-06 DIAGNOSIS — Z885 Allergy status to narcotic agent status: Secondary | ICD-10-CM

## 2020-02-06 DIAGNOSIS — E785 Hyperlipidemia, unspecified: Secondary | ICD-10-CM | POA: Diagnosis present

## 2020-02-06 DIAGNOSIS — Z7901 Long term (current) use of anticoagulants: Secondary | ICD-10-CM

## 2020-02-06 DIAGNOSIS — F419 Anxiety disorder, unspecified: Secondary | ICD-10-CM | POA: Diagnosis present

## 2020-02-06 DIAGNOSIS — R4182 Altered mental status, unspecified: Secondary | ICD-10-CM | POA: Diagnosis present

## 2020-02-06 DIAGNOSIS — Z20822 Contact with and (suspected) exposure to covid-19: Secondary | ICD-10-CM | POA: Diagnosis present

## 2020-02-06 DIAGNOSIS — I959 Hypotension, unspecified: Secondary | ICD-10-CM | POA: Diagnosis present

## 2020-02-06 DIAGNOSIS — I6389 Other cerebral infarction: Principal | ICD-10-CM | POA: Diagnosis present

## 2020-02-06 DIAGNOSIS — M25561 Pain in right knee: Secondary | ICD-10-CM | POA: Diagnosis present

## 2020-02-06 DIAGNOSIS — E43 Unspecified severe protein-calorie malnutrition: Secondary | ICD-10-CM | POA: Diagnosis present

## 2020-02-06 DIAGNOSIS — Z79891 Long term (current) use of opiate analgesic: Secondary | ICD-10-CM

## 2020-02-06 DIAGNOSIS — G894 Chronic pain syndrome: Secondary | ICD-10-CM | POA: Diagnosis present

## 2020-02-06 DIAGNOSIS — Z79899 Other long term (current) drug therapy: Secondary | ICD-10-CM

## 2020-02-06 DIAGNOSIS — J9601 Acute respiratory failure with hypoxia: Secondary | ICD-10-CM | POA: Diagnosis present

## 2020-02-06 DIAGNOSIS — Z9882 Breast implant status: Secondary | ICD-10-CM

## 2020-02-06 DIAGNOSIS — Z888 Allergy status to other drugs, medicaments and biological substances status: Secondary | ICD-10-CM

## 2020-02-06 DIAGNOSIS — R531 Weakness: Secondary | ICD-10-CM | POA: Diagnosis present

## 2020-02-06 DIAGNOSIS — I69320 Aphasia following cerebral infarction: Secondary | ICD-10-CM

## 2020-02-06 LAB — CBC
HCT: 42 % (ref 36.0–46.0)
Hemoglobin: 13.3 g/dL (ref 12.0–15.0)
MCH: 24.4 pg — ABNORMAL LOW (ref 26.0–34.0)
MCHC: 31.7 g/dL (ref 30.0–36.0)
MCV: 77.2 fL — ABNORMAL LOW (ref 80.0–100.0)
Platelets: 220 10*3/uL (ref 150–400)
RBC: 5.44 MIL/uL — ABNORMAL HIGH (ref 3.87–5.11)
RDW: 16.4 % — ABNORMAL HIGH (ref 11.5–15.5)
WBC: 7.9 10*3/uL (ref 4.0–10.5)
nRBC: 0 % (ref 0.0–0.2)

## 2020-02-06 LAB — BASIC METABOLIC PANEL
Anion gap: 11 (ref 5–15)
BUN: 22 mg/dL (ref 8–23)
CO2: 26 mmol/L (ref 22–32)
Calcium: 9.9 mg/dL (ref 8.9–10.3)
Chloride: 100 mmol/L (ref 98–111)
Creatinine, Ser: 0.92 mg/dL (ref 0.44–1.00)
GFR calc Af Amer: >60 mL/min (ref >60)
GFR calc non Af Amer: >60 mL/min (ref >60)
Glucose, Bld: 161 mg/dL — ABNORMAL HIGH (ref 70–99)
Potassium: 3.7 mmol/L (ref 3.5–5.1)
Sodium: 137 mmol/L (ref 135–145)

## 2020-02-06 NOTE — ED Triage Notes (Signed)
Pt arrived via EMS from home where she lives with family. Pt had cortisone infections in bilateral knees today and since returning home, pt has experienced bilateral weakness and ambulation difficulty. Pt can not bare weight. Pt had CVA x3 weeks ago with no deficits.

## 2020-02-07 ENCOUNTER — Emergency Department: Payer: Medicare (Managed Care)

## 2020-02-07 ENCOUNTER — Inpatient Hospital Stay
Admission: EM | Admit: 2020-02-07 | Discharge: 2020-02-15 | DRG: 064 | Disposition: A | Discharge status: Skilled Nursing Facility | Payer: Medicare (Managed Care) | Attending: Internal Medicine | Admitting: Internal Medicine

## 2020-02-07 DIAGNOSIS — M79651 Pain in right thigh: Secondary | ICD-10-CM

## 2020-02-07 DIAGNOSIS — I639 Cerebral infarction, unspecified: Secondary | ICD-10-CM | POA: Diagnosis present

## 2020-02-07 DIAGNOSIS — R29898 Other symptoms and signs involving the musculoskeletal system: Secondary | ICD-10-CM | POA: Diagnosis present

## 2020-02-07 DIAGNOSIS — G894 Chronic pain syndrome: Secondary | ICD-10-CM | POA: Diagnosis present

## 2020-02-07 DIAGNOSIS — G929 Unspecified toxic encephalopathy: Secondary | ICD-10-CM | POA: Insufficient documentation

## 2020-02-07 DIAGNOSIS — M25561 Pain in right knee: Secondary | ICD-10-CM

## 2020-02-07 DIAGNOSIS — I4819 Other persistent atrial fibrillation: Secondary | ICD-10-CM

## 2020-02-07 DIAGNOSIS — I4891 Unspecified atrial fibrillation: Secondary | ICD-10-CM | POA: Diagnosis present

## 2020-02-07 DIAGNOSIS — I959 Hypotension, unspecified: Secondary | ICD-10-CM

## 2020-02-07 DIAGNOSIS — R4182 Altered mental status, unspecified: Secondary | ICD-10-CM

## 2020-02-07 LAB — URINALYSIS, COMPLETE (UACMP) WITH MICROSCOPIC
Bacteria, UA: NONE SEEN
Bilirubin Urine: NEGATIVE
Glucose, UA: NEGATIVE mg/dL
Hgb urine dipstick: NEGATIVE
Ketones, ur: NEGATIVE mg/dL
Leukocytes,Ua: NEGATIVE
Nitrite: NEGATIVE
Protein, ur: NEGATIVE mg/dL
Specific Gravity, Urine: 1.023 (ref 1.005–1.030)
pH: 5 (ref 5.0–8.0)

## 2020-02-07 LAB — URINE DRUG SCREEN, QUALITATIVE (ARMC ONLY)
Amphetamines, Ur Screen: NOT DETECTED
Barbiturates, Ur Screen: NOT DETECTED
Benzodiazepine, Ur Scrn: POSITIVE — AB
Cannabinoid 50 Ng, Ur ~~LOC~~: NOT DETECTED
Cocaine Metabolite,Ur ~~LOC~~: NOT DETECTED
MDMA (Ecstasy)Ur Screen: NOT DETECTED
Methadone Scn, Ur: NOT DETECTED
Opiate, Ur Screen: POSITIVE — AB
Phencyclidine (PCP) Ur S: NOT DETECTED
Tricyclic, Ur Screen: NOT DETECTED

## 2020-02-07 LAB — RESPIRATORY PANEL BY RT PCR (FLU A&B, COVID)
Influenza A by PCR: NEGATIVE
Influenza B by PCR: NEGATIVE
SARS Coronavirus 2 by RT PCR: NEGATIVE

## 2020-02-07 MED ORDER — ADULT MULTIVITAMIN W/MINERALS CH
1.0000 | ORAL_TABLET | Freq: Every day | ORAL | Status: DC
  Administered 2020-02-07 – 2020-02-15 (×9): 1 via ORAL
  Filled 2020-02-07 (×9): qty 1

## 2020-02-07 MED ORDER — ONDANSETRON HCL 4 MG PO TABS: 4.0000 mg | ORAL_TABLET | Freq: Four times a day (QID) | ORAL | Status: DC | PRN

## 2020-02-07 MED ORDER — SODIUM CHLORIDE 0.9 % IV BOLUS
1000.0000 mL | Freq: Once | INTRAVENOUS | Status: AC
  Administered 2020-02-07: 1000 mL via INTRAVENOUS

## 2020-02-07 MED ORDER — LOPERAMIDE HCL 2 MG PO CAPS: 2.0000 mg | ORAL_CAPSULE | ORAL | Status: DC | PRN

## 2020-02-07 MED ORDER — SODIUM CHLORIDE 0.9% FLUSH
3.0000 mL | Freq: Two times a day (BID) | INTRAVENOUS | Status: DC
  Administered 2020-02-08 – 2020-02-15 (×16): 3 mL via INTRAVENOUS

## 2020-02-07 MED ORDER — ACETAMINOPHEN 325 MG PO TABS: 650.0000 mg | ORAL_TABLET | ORAL | Status: DC | PRN

## 2020-02-07 MED ORDER — NICOTINE 7 MG/24HR TD PT24
7.0000 mg | MEDICATED_PATCH | Freq: Every day | TRANSDERMAL | Status: DC
  Filled 2020-02-07 (×9): qty 1

## 2020-02-07 MED ORDER — SODIUM CHLORIDE 0.9 % IV SOLN: INTRAVENOUS | Status: DC

## 2020-02-07 MED ORDER — ONDANSETRON HCL 4 MG/2ML IJ SOLN: 4.0000 mg | Freq: Four times a day (QID) | INTRAMUSCULAR | Status: DC | PRN

## 2020-02-07 MED ORDER — DABIGATRAN ETEXILATE MESYLATE 150 MG PO CAPS
150.0000 mg | ORAL_CAPSULE | Freq: Two times a day (BID) | ORAL | Status: DC
  Administered 2020-02-07 – 2020-02-15 (×15): 150 mg via ORAL
  Filled 2020-02-07 (×18): qty 1

## 2020-02-07 MED ORDER — POLYETHYLENE GLYCOL 3350 17 G PO PACK
17.0000 g | PACK | Freq: Two times a day (BID) | ORAL | Status: DC
  Administered 2020-02-08 – 2020-02-14 (×4): 17 g via ORAL
  Filled 2020-02-07 (×12): qty 1

## 2020-02-07 MED ORDER — ATORVASTATIN CALCIUM 20 MG PO TABS
40.0000 mg | ORAL_TABLET | Freq: Every day | ORAL | Status: DC
  Administered 2020-02-07 – 2020-02-15 (×8): 40 mg via ORAL
  Filled 2020-02-07 (×9): qty 2

## 2020-02-07 MED ORDER — METOPROLOL SUCCINATE ER 50 MG PO TB24
50.0000 mg | ORAL_TABLET | Freq: Every day | ORAL | Status: DC
  Administered 2020-02-08 – 2020-02-15 (×8): 50 mg via ORAL
  Filled 2020-02-07 (×9): qty 1

## 2020-02-07 MED ORDER — DILTIAZEM HCL ER COATED BEADS 300 MG PO CP24
300.0000 mg | ORAL_CAPSULE | Freq: Every day | ORAL | Status: DC
  Filled 2020-02-07 (×2): qty 1

## 2020-02-07 NOTE — ED Notes (Signed)
Pt placed on cardiac monitor by this RN. Pt noted be in NSR at this time, intermittent SB. Pt awakens upon this RN arrival to bedside, however falls back asleep with no stimuli.

## 2020-02-07 NOTE — ED Notes (Signed)
Pt watching tv in no acute distress.  

## 2020-02-07 NOTE — ED Notes (Signed)
Home medications sent down to pharmacy by mac brown, rn.

## 2020-02-07 NOTE — ED Notes (Signed)
Pt's pulse ox noted to not be reading. This RN attempted both hands, foot, forehead, ear, and nose without success. EDP notified.

## 2020-02-07 NOTE — ED Notes (Signed)
Pt's nasal O2 sensor began reading with good pleth, noted tobe 78% on RA, placed on 2L via [place], sats up to 95% on 2L via [place], EDP made aware.

## 2020-02-07 NOTE — ED Notes (Addendum)
Spoke with sister, Harlow Mares , sister sts pt needs a neurology consult for nerve conduction study bc appointments are 5-6 weeks away. 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 Sister made aware she may not have medical information over the phone, but that her sister is awake and stable

## 2020-02-07 NOTE — ED Notes (Signed)
This RN to bedside, introduced self to patient. Pt noted to be somnolent on this RN arrival to bedside, however awakens easily with verbal stimuli. Pt noted to fall to sleep immediately after stimuli stops. Call bell within reach of patient at this time.

## 2020-02-07 NOTE — ED Notes (Signed)
This RN to bedside, IVF initiated by this RN. Pt requesting this RN not update her sister regarding pt condition. This RN explained would not update sister. Lights dimmed for patient comfort at this time.

## 2020-02-07 NOTE — ED Notes (Signed)
Pt sitting up, eating lunch

## 2020-02-07 NOTE — ED Notes (Signed)
Pt visualized resting in bed with eyes closed at this time. Respirations even and unlabored at this time. Will introduce self to pt upon awakening .

## 2020-02-07 NOTE — H&P (Signed)
History and Physical    Julie LeverSusan Harmon AOZ:[id number] DOB: [birth year] DOA: 02/07/2020  PCP: Tarri FullerMalfi, Nicole M, FNP   Patient coming from: Home  I have personally briefly reviewed [facility]  Chief Complaint:   HPI: Julie LeverSusan Harmon is a 64 y.o. female with medical history significant for chronic pain syndrome, history of CVA, history of A. fib and dyslipidemia who was brought into the ER by EMS for evaluation of lower extremity weakness.  Patient received corticosteroid injections in both knees and since then has been complaining of weakness in her legs.  She is oriented to person and place but not to time.  She was recently discharged from acute rehab following an ischemic CVA of the frontal lobe.  While in the emergency room patient was noted to be hypotensive requiring a 1 L fluid bolus and room air pulse oximetry of 78% which improved following oxygen supplementation at 2 L to 95%. Patient was also very lethargic while in the ER and the ER physician has requested observation overnight since patient is on opioids at home for chronic pain and hypoxia as well as hypotension may be related to same. Her labs are within normal limits and urine drug screen is positive for opiates and benzodiazepines. CT scan of the head without contrast showed no acute intracranial process. Stable pattern of multifocal bilateral remote ischemic infarcts, cortical and central atrophy, ex vacuo dilation of the lateral ventricles and chronic microvascular ischemic white matter disease. Chest x-ray reviewed by me shows clear lungs   ED Course: Patient is a 64 year old female who was brought into the ER by EMS for evaluation of lower extremity weakness following corticosteroid injections to her knees.  Patient had weakness in her lower extremities and was unable to bear weight.  While in the ER she became hypoxic and hypotensive requiring fluid resuscitation with improvement in her  blood pressure.  Room air pulse oximetry was 78% and on 2 L she was up to 95%.  She will be referred to observation status for further evaluation.  Review of Systems: As per HPI otherwise 10 point review of systems negative.   History reviewed. No pertinent past medical history.  History reviewed. No pertinent surgical history.   reports that she has never smoked. She has never used smokeless tobacco. She reports that she does not drink alcohol and does not use drugs.  Allergies  Allergen Reactions  . Iodine Swelling  . Morphine Nausea Only    History reviewed. No pertinent family history.   Prior to Admission medications   Medication Sig Start Date End Date Taking? Authorizing Provider  acetaminophen (TYLENOL) 325 MG tablet Take 2 tablets (650 mg total) by mouth every 4 (four) hours as needed for mild pain or moderate pain (or temp > 37.5 C (99.5 F)). 02/01/20   Angiulli, Mcarthur Rossettianiel J, PA-C  ALPRAZolam Prudy Feeler(XANAX) 0.25 MG tablet Take 1 tablet (0.25 mg total) by mouth 2 (two) times daily as needed for anxiety. 02/01/20   Angiulli, Mcarthur Rossettianiel J, PA-C  atorvastatin (LIPITOR) 40 MG tablet Take 1 tablet (40 mg total) by mouth daily. 02/01/20   Angiulli, Mcarthur Rossettianiel J, PA-C  dabigatran (PRADAXA) 150 MG CAPS capsule Take 1 capsule (150 mg total) by mouth every 12 (twelve) hours. 02/01/20   Angiulli, Mcarthur Rossettianiel J, PA-C  diltiazem (CARDIZEM CD) 300 MG 24 hr capsule Take 1 capsule (300 mg total) by mouth daily. 02/01/20   Angiulli, Mcarthur Rossettianiel J, PA-C  loperamide (IMODIUM) 2 MG capsule  Take 1 capsule (2 mg total) by mouth as needed for diarrhea or loose stools. 01/19/20   Tresa Moore, MD  metoprolol succinate (TOPROL-XL) 50 MG 24 hr tablet Take 1 tablet (50 mg total) by mouth daily. Take with or immediately following a meal. 02/01/20   Angiulli, Mcarthur Rossetti, PA-C  Multiple Vitamin (MULTIVITAMIN WITH MINERALS) TABS tablet Take 1 tablet by mouth daily. 02/01/20   Angiulli, Mcarthur Rossetti, PA-C  nicotine (NICODERM CQ - DOSED IN MG/24  HOURS) 14 mg/24hr patch 14 mg patch daily x2 weeks then 7 mg patch daily x3 weeks and stop 02/01/20   Angiulli, Mcarthur Rossetti, PA-C  oxyCODONE 10 MG TABS Take 1 tablet (10 mg total) by mouth every 8 (eight) hours as needed for severe pain. 02/01/20   Angiulli, Mcarthur Rossetti, PA-C  polyethylene glycol (MIRALAX / GLYCOLAX) 17 g packet Take 17 g by mouth 2 (two) times daily. 02/01/20   Charlton Amor, PA-C    Physical Exam: Vitals:   02/07/20 0958 02/07/20 1030 02/07/20 1100 02/07/20 1147  BP: (!) 102/55 (!) 92/55 (!) 89/63 98/64  Pulse:  (!) 48 60 63  Resp: 19 15 15 16   Temp:    98.8 F (37.1 C)  TempSrc:    Oral  SpO2:  97% 100% 98%     Vitals:   02/07/20 0958 02/07/20 1030 02/07/20 1100 02/07/20 1147  BP: (!) 102/55 (!) 92/55 (!) 89/63 98/64  Pulse:  (!) 48 60 63  Resp: 19 15 15 16   Temp:    98.8 F (37.1 C)  TempSrc:    Oral  SpO2:  97% 100% 98%    Constitutional: NAD, alert and oriented x 2.  Person and place Eyes: PERRL, lids and conjunctivae pallor ENMT: Mucous membranes are moist.  Neck: normal, supple, no masses, no thyromegaly Respiratory: clear to auscultation bilaterally, no wheezing, no crackles. Normal respiratory effort. No accessory muscle use.  Cardiovascular: Regular rate and rhythm, no murmurs / rubs / gallops. No extremity edema. 2+ pedal pulses. No carotid bruits.  Abdomen: no tenderness, no masses palpated. No hepatosplenomegaly. Bowel sounds positive.  Musculoskeletal: no clubbing / cyanosis. No joint deformity upper and lower extremities.  Skin: no rashes, lesions, ulcers.  Neurologic: No gross focal neurologic deficit. Psychiatric: Normal mood and affect.   Labs on Admission: I have personally reviewed following labs and imaging studies  CBC: Recent Labs  Lab 02/06/20 2125  WBC 7.9  HGB 13.3  HCT 42.0  MCV 77.2*  PLT 220   Basic Metabolic Panel: Recent Labs  Lab 01/31/20 1508 02/06/20 2125  NA 139 137  K 4.9 3.7  CL 108 100  CO2 26 26    GLUCOSE 126* 161*  BUN 21 22  CREATININE 1.43* 0.92  CALCIUM 9.6 9.9   GFR: CrCl cannot be calculated (Unknown ideal weight.). Liver Function Tests: No results for input(s): AST, ALT, ALKPHOS, BILITOT, PROT, ALBUMIN in the last 168 hours. No results for input(s): LIPASE, AMYLASE in the last 168 hours. No results for input(s): AMMONIA in the last 168 hours. Coagulation Profile: No results for input(s): INR, PROTIME in the last 168 hours. Cardiac Enzymes: No results for input(s): CKTOTAL, CKMB, CKMBINDEX, TROPONINI in the last 168 hours. BNP (last 3 results) No results for input(s): PROBNP in the last 8760 hours. HbA1C: No results for input(s): HGBA1C in the last 72 hours. CBG: No results for input(s): GLUCAP in the last 168 hours. Lipid Profile: No results for input(s): CHOL, HDL, LDLCALC,  TRIG, CHOLHDL, LDLDIRECT in the last 72 hours. Thyroid Function Tests: No results for input(s): TSH, T4TOTAL, FREET4, T3FREE, THYROIDAB in the last 72 hours. Anemia Panel: No results for input(s): VITAMINB12, FOLATE, FERRITIN, TIBC, IRON, RETICCTPCT in the last 72 hours. Urine analysis:    Component Value Date/Time   COLORURINE YELLOW (A) 02/07/2020 0815   APPEARANCEUR CLEAR (A) 02/07/2020 0815   LABSPEC 1.023 02/07/2020 0815   PHURINE 5.0 02/07/2020 0815   GLUCOSEU NEGATIVE 02/07/2020 0815   HGBUR NEGATIVE 02/07/2020 0815   BILIRUBINUR NEGATIVE 02/07/2020 0815   KETONESUR NEGATIVE 02/07/2020 0815   PROTEINUR NEGATIVE 02/07/2020 0815   NITRITE NEGATIVE 02/07/2020 0815   LEUKOCYTESUR NEGATIVE 02/07/2020 0815    Radiological Exams on Admission: CT Head Wo Contrast  Result Date: 02/07/2020 CLINICAL DATA:  Altered mental status, possible stroke EXAM: CT HEAD WITHOUT CONTRAST TECHNIQUE: Contiguous axial images were obtained from the base of the skull through the vertex without intravenous contrast. COMPARISON:  Recent prior CT scan of the head 01/14/2020; prior brain MRI 01/16/2020  FINDINGS: Brain: No evidence of acute infarction, hemorrhage, hydrocephalus, extra-axial collection or mass lesion/mass effect. Multifocal regions of focal encephalomalacia and decreased attenuation including the right frontal, right parietal, left parietal and left inferior frontal lobes consistent with multiple prior ischemic infarcts. The pattern is unchanged compared to relatively recent prior imaging. Additionally, there is both central and cortical volume loss. Central volume loss resultant ex vacuo dilatation of the lateral ventricles. Periventricular, subcortical and deep white matter hypoattenuation also present consistent with chronic microvascular ischemic white matter disease. Vascular: No hyperdense vessel or unexpected calcification. Skull: Normal. Negative for fracture or focal lesion. Sinuses/Orbits: No acute finding. Other: None. IMPRESSION: 1. No acute intracranial process. 2. Stable pattern of multifocal bilateral remote ischemic infarcts, cortical and central atrophy, ex vacuo dilation of the lateral ventricles and chronic microvascular ischemic white matter disease. Electronically Signed   By: Malachy Moan M.D.   On: 02/07/2020 09:19   DG Chest Portable 1 View  Result Date: 02/07/2020 CLINICAL DATA:  Weakness, difficulty ambulating EXAM: PORTABLE CHEST 1 VIEW COMPARISON:  None. FINDINGS: Normal heart size and vascularity. Lungs remain clear. No focal pneumonia, collapse or consolidation. Negative for edema, effusion pneumothorax. Trachea midline. Aorta atherosclerotic. Mild scoliosis of the spine. Calcified breast implants noted. Additional round left upper quadrant peripherally calcified abnormality noted, of uncertain origin by plain radiography. This also may related to prior breast surgery. No available comparison studies. IMPRESSION: No active chest disease.  See above comment. Aortic Atherosclerosis (ICD10-I70.0). Electronically Signed   By: Judie Petit.  Shick M.D.   On: 02/07/2020 10:56      EKG: Independently reviewed.    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 (HCC)   Atrial fibrillation (HCC)   Chronic pain syndrome    Bilateral lower extremity weakness Unclear etiology Patient with recent CVA but no focal deficits Was said to have received corticosteroid injections in both knees PT evaluation Fall precautions   Altered mental status Most likely medication induced Patient is on opioids and benzodiazepines for chronic pain syndrome Hold benzodiazepines and oxycodone for now   History of recent ischemic CVA involving the frontal lobe Continue atorvastatin and Pradaxa   History of atrial fibrillation Rate controlled on Cardizem Continue Pradaxa secondary prophylaxis for an acute stroke     DVT prophylaxis: Pradaxa Code Status: Full code Family Communication: Disposition Plan: Back to previous home environment Consults called: Physical  therapy    Lucile Shutters MD [facility]     02/07/2020, 1:38 PM

## 2020-02-07 NOTE — ED Provider Notes (Signed)
[facility] Emergency Department Provider Note  Time seen: 7:25 AM  I have reviewed the triage vital signs and the nursing notes.   HISTORY  Chief Complaint Extremity Weakness   HPI Julie Harmon is a 64 y.o. female with a past medical history of chronic pain, CVA with expressive aphasia, presents to the emergency department for reported weakness.  According to report patient arrives from home via EMS with complaints of lower extremity weakness.  Patient received corticosteroid injections in the bilateral knees and since has been complaining of weakness to her legs.  Here the patient is awake she is alert she is oriented x3 she does have difficulty speaking at times however this appears to be baseline.  She does not appear to have any active complaints besides pain which she states is long-term/chronic.  Patient denies any fever cough.  Patient did not state anything regarding leg pain however when I specifically asked she states yes they are hurting her.   History reviewed. No pertinent past medical history.  Patient Active Problem List   Diagnosis Date Noted  . Bilious vomiting with nausea   . Leukocytosis   . Atrial fibrillation (HCC)   . Chronic pain syndrome   . Dyslipidemia   . Protein-calorie malnutrition, severe 01/19/2020  . 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 Expressive aphasia 01/15/2020    History reviewed. No pertinent surgical history.  Prior to Admission medications   Medication Sig Start Date End Date Taking? Authorizing Provider  acetaminophen (TYLENOL) 325 MG tablet Take 2 tablets (650 mg total) by mouth every 4 (four) hours as needed for mild pain or moderate pain (or temp > 37.5 C (99.5 F)). 02/01/20   Angiulli, Mcarthur Rossetti, PA-C  ALPRAZolam Prudy Feeler) 0.25 MG tablet Take 1 tablet (0.25 mg total) by mouth 2 (two) times daily as needed for anxiety. 02/01/20   Angiulli, Mcarthur Rossetti, PA-C  atorvastatin (LIPITOR)  40 MG tablet Take 1 tablet (40 mg total) by mouth daily. 02/01/20   Angiulli, Mcarthur Rossetti, PA-C  dabigatran (PRADAXA) 150 MG CAPS capsule Take 1 capsule (150 mg total) by mouth every 12 (twelve) hours. 02/01/20   Angiulli, Mcarthur Rossetti, PA-C  diltiazem (CARDIZEM CD) 300 MG 24 hr capsule Take 1 capsule (300 mg total) by mouth daily. 02/01/20   Angiulli, Mcarthur Rossetti, PA-C  loperamide (IMODIUM) 2 MG capsule Take 1 capsule (2 mg total) by mouth as needed for diarrhea or loose stools. 01/19/20   Tresa Moore, MD  metoprolol succinate (TOPROL-XL) 50 MG 24 hr tablet Take 1 tablet (50 mg total) by mouth daily. Take with or immediately following a meal. 02/01/20   Angiulli, Mcarthur Rossetti, PA-C  Multiple Vitamin (MULTIVITAMIN WITH MINERALS) TABS tablet Take 1 tablet by mouth daily. 02/01/20   Angiulli, Mcarthur Rossetti, PA-C  nicotine (NICODERM CQ - DOSED IN MG/24 HOURS) 14 mg/24hr patch 14 mg patch daily x2 weeks then 7 mg patch daily x3 weeks and stop 02/01/20   Angiulli, Mcarthur Rossetti, PA-C  oxyCODONE 10 MG TABS Take 1 tablet (10 mg total) by mouth every 8 (eight) hours as needed for severe pain. 02/01/20   Angiulli, Mcarthur Rossetti, PA-C  polyethylene glycol (MIRALAX / GLYCOLAX) 17 g packet Take 17 g by mouth 2 (two) times daily. 02/01/20   Angiulli, Mcarthur Rossetti, PA-C    Allergies  Allergen Reactions  . Iodine Swelling  . Morphine Nausea Only    History reviewed. No pertinent family history.  Social  History Social History   Tobacco Use  . Smoking status: Never Smoker  . Smokeless tobacco: Never Used  Substance Use Topics  . Alcohol use: Never  . Drug use: Never    Review of Systems Constitutional: Negative for fever.  Positive for generalized weakness Cardiovascular: Negative for chest pain. Respiratory: Negative for shortness of breath. Gastrointestinal: Negative for abdominal pain Musculoskeletal: States bilateral leg pain. Neurological: Negative for headache All other ROS  negative  ____________________________________________   PHYSICAL EXAM:  VITAL SIGNS: ED Triage Vitals  Enc Vitals Group     BP 02/06/20 2115 97/65     Pulse Rate 02/06/20 2115 (!) 102     Resp 02/06/20 2115 18     Temp 02/06/20 2115 98.3 F (36.8 C)     Temp Source 02/06/20 2115 Oral     SpO2 02/06/20 2115 96 %     Weight --      Height --      Head Circumference --      Peak Flow --      Pain Score 02/07/20 0632 8     Pain Loc --      Pain Edu? --      Excl. in GC? --    Constitutional: Alert and oriented.  Calm, cooperative, no distress. Eyes: Normal exam ENT      Head: Normocephalic and atraumatic.      Nose: No congestion/rhinnorhea.      Mouth/Throat: Mucous membranes are moist. Cardiovascular: Normal rate, regular rhythm. No murmur Respiratory: Normal respiratory effort without tachypnea nor retractions. Breath sounds are clear  Gastrointestinal: Soft and nontender. No distention. Musculoskeletal: Nontender with normal range of motion in all extremities. No lower extremity tenderness or edema. Neurologic: Patient does have some difficulty with speech but is able to communicate.  Is able to move her extremities. Skin:  Skin is warm, dry and intact.  Psychiatric: Mood and affect are normal.  ____________________________________________   INITIAL IMPRESSION / ASSESSMENT AND PLAN / ED COURSE  Pertinent labs & imaging results that were available during my care of the patient were reviewed by me and considered in my medical decision making (see chart for details).   Patient presents to the emergency department for lower extremity weakness after receiving corticosteroid injections bilateral knees.  Overall the patient appears well, does appear to be somewhat deconditioned which is likely chronic.  Did not have any complaints however when asked specifically about her legs she states they are hurting her.  She has good range of motion in both of her legs.  We will attempt  to ambulate the patient to see how she does.  Patient's labs are largely nonrevealing.  Patient remains somewhat somnolent, blood pressure remains on the hypotensive end.  Patient has received fluids.  I reviewed the patient's medical history appeared to have been on OxyContin as well as Xanax previously which was discontinued 01/12/2020.  I am not sure if this is due to overmedication however as the patient has been in the emergency department nearly 15 hours and remained this somnolent unable to stand on her own without assistance we will admit to the hospital service for further work-up and treatment.  Urine toxicology is positive for opiates and benzodiazepines.  Remainder the patient's medical work-up has been largely nonrevealing.    Julie Harmon was evaluated in Emergency Department on 02/07/2020 for the symptoms described in the history of present illness. She was evaluated in the context of the global COVID-19 pandemic, which  necessitated consideration that the patient might be at risk for infection with the SARS-CoV-2 virus that causes COVID-19. Institutional protocols and algorithms that pertain to the evaluation of patients at risk for COVID-19 are in a state of rapid change based on information released by regulatory bodies including the CDC and federal and state organizations. These policies and algorithms were followed during the patient's care in the ED.  ____________________________________________   FINAL CLINICAL IMPRESSION(S) / ED DIAGNOSES  Weakness   Minna Antis, MD 02/07/20 1505

## 2020-02-07 NOTE — ED Notes (Signed)
In and out cath performed by this RN and Tresa Endo, Charity fundraiser. Pt tolerated well. Urine sample sent to lab at this time. Purewick placed at this time. Pt repositioned in bed. Call bell within reach of patient at this time.

## 2020-02-07 NOTE — ED Notes (Signed)
Pt transported to CT at this time.

## 2020-02-07 NOTE — Progress Notes (Signed)
Physical Therapy Evaluation Patient Details Name: Julie Harmon MRN: [id number] DOB: [birth year] Today's Date: 02/07/2020   History of Present Illness  Per MD note:Julie Harmon is a 64 y.o. female with medical history significant for chronic pain syndrome, history of CVA, history of A. fib and dyslipidemia who was brought into the ER by EMS for evaluation of lower extremity weakness.  Patient received corticosteroid injections in both knees and since then has been complaining of weakness in her legs.  She is oriented to person and place but not to time.  She was recently discharged from acute rehab following an ischemic CVA of the frontal lobe.   Clinical Impression  Patient agrees to PT evaluation. She reports having 8/10 pain to B knees and right hip. She has 2/5 strength to B hips and B knees, 3/5 strength to B ankles. She refuses to try bed mobility or sitting up at edge of bed due to 8/10 pain in BLE. She first tells Clinical research associate that she lives alone and is bed bound, but then talks about living with her sister and using a RW at her home. She has level entry and reports having cortisone injections yesterday but doesn't remember how she got to the doctors office to get them. She will continue to benefit from skilled PT to improve mobility and safety.     Follow Up Recommendations SNF    Equipment Recommendations  Rolling walker with 5" wheels    Recommendations for Other Services       Precautions / Restrictions Precautions Precautions: Fall Restrictions Weight Bearing Restrictions: No      Mobility  Bed Mobility Overal bed mobility:  (Pt refused to try to get out of bed due to high pain level)                Transfers                    Ambulation/Gait                Stairs            Wheelchair Mobility    Modified Rankin (Stroke Patients Only)       Balance                                             Pertinent Vitals/Pain  Pain Assessment: 0-10 Pain Score: 8  Pain Location:  (B legs/knees/right hip)    Home Living Family/patient expects to be discharged to:: Private residence Living Arrangements: Other relatives (sister) Available Help at Discharge: Family Type of Home: House Home Access: Level entry       Home Equipment: Environmental consultant - 2 wheels      Prior Function Level of Independence: Needs assistance   Gait / Transfers Assistance Needed: pt reports that she was not able to walk           Hand Dominance        Extremity/Trunk Assessment   Upper Extremity Assessment Upper Extremity Assessment: Generalized weakness    Lower Extremity Assessment Lower Extremity Assessment: RLE deficits/detail;LLE deficits/detail RLE Deficits / Details: hip flex 2/5, abd 2/5, knee flex/ext 2/5 LLE Deficits / Details: hip flex 2/5, abd 2/5, knee flex/ext 2/5       Communication   Communication: No difficulties  Cognition Arousal/Alertness: Awake/alert Behavior During Therapy: Agitated Overall Cognitive Status: Impaired/Different from  baseline Area of Impairment: Orientation;Memory                                      General Comments      Exercises     Assessment/Plan    PT Assessment Patient needs continued PT services  PT Problem List Decreased strength;Decreased range of motion;Decreased activity tolerance;Decreased mobility;Decreased cognition       PT Treatment Interventions Gait training;Therapeutic activities;Therapeutic exercise;Balance training;Patient/family education    PT Goals (Current goals can be found in the Care Plan section)  Acute Rehab PT Goals Patient Stated Goal: to not have pain PT Goal Formulation: With patient Time For Goal Achievement: 02/07/20 Potential to Achieve Goals: Fair    Frequency Min 2X/week   Barriers to discharge Decreased caregiver support      Co-evaluation               AM-PAC PT "6 Clicks" Mobility  Outcome Measure  Help needed turning from your back to your side while in a flat bed without using bedrails?: Total Help needed moving from lying on your back to sitting on the side of a flat bed without using bedrails?: Total Help needed moving to and from a bed to a chair (including a wheelchair)?: Total Help needed standing up from a chair using your arms (e.g., wheelchair or bedside chair)?: Total Help needed to walk in hospital room?: Total Help needed climbing 3-5 steps with a railing? : Total 6 Click Score: 6    End of Session Equipment Utilized During Treatment: Gait belt Activity Tolerance: Patient limited by pain Patient left: in bed Nurse Communication: Mobility status PT Visit Diagnosis: Muscle weakness (generalized) (M62.81);Difficulty in walking, not elsewhere classified (R26.2);Pain Pain - Right/Left: Left Pain - part of body: Leg    Time: 1650-1710 PT Time Calculation (min) (ACUTE ONLY): 20 min   Charges:   PT Evaluation $PT Eval Low Complexity: 1 Low         [place], Sharion Settler, PT DPT 02/07/2020, 4:28 PM

## 2020-02-08 ENCOUNTER — Other Ambulatory Visit: Payer: Self-pay

## 2020-02-08 DIAGNOSIS — I4819 Other persistent atrial fibrillation: Secondary | ICD-10-CM | POA: Diagnosis not present

## 2020-02-08 LAB — BASIC METABOLIC PANEL
Anion gap: 7 (ref 5–15)
BUN: 21 mg/dL (ref 8–23)
CO2: 25 mmol/L (ref 22–32)
Calcium: 9 mg/dL (ref 8.9–10.3)
Chloride: 108 mmol/L (ref 98–111)
Creatinine, Ser: 0.53 mg/dL (ref 0.44–1.00)
GFR calc Af Amer: >60 mL/min (ref >60)
GFR calc non Af Amer: >60 mL/min (ref >60)
Glucose, Bld: 138 mg/dL — ABNORMAL HIGH (ref 70–99)
Potassium: 3.9 mmol/L (ref 3.5–5.1)
Sodium: 140 mmol/L (ref 135–145)

## 2020-02-08 LAB — CBC
HCT: 36.7 % (ref 36.0–46.0)
Hemoglobin: 12 g/dL (ref 12.0–15.0)
MCH: 24.7 pg — ABNORMAL LOW (ref 26.0–34.0)
MCHC: 32.7 g/dL (ref 30.0–36.0)
MCV: 75.7 fL — ABNORMAL LOW (ref 80.0–100.0)
Platelets: 206 10*3/uL (ref 150–400)
RBC: 4.85 MIL/uL (ref 3.87–5.11)
RDW: 16.4 % — ABNORMAL HIGH (ref 11.5–15.5)
WBC: 12.5 10*3/uL — ABNORMAL HIGH (ref 4.0–10.5)
nRBC: 0 % (ref 0.0–0.2)
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
## 2020-02-08 NOTE — Progress Notes (Signed)
PROGRESS NOTE    Julie Harmon  WKM:[id number] DOB: 01/11/1956 DOA: 02/07/2020 PCP: Tarri Fuller, FNP    Assessment & Plan:   Principal Problem:   Lower extremity weakness Active Problems:   Ischemic cerebrovascular accident (CVA) of frontal lobe (HCC)   Atrial fibrillation (HCC)   Chronic pain syndrome    Julie Harmon is a 64 y.o. female with medical history significant for chronic pain syndrome, history of CVA, history of A. fib and dyslipidemia who was brought into the ER by EMS for evaluation of lower extremity weakness.  Patient received corticosteroid injections in both knees and since then has been complaining of weakness in her legs.  She is oriented to person and place but not to time.  She was recently discharged from acute rehab following an ischemic CVA of the frontal lobe.  While in the emergency room patient was noted to be hypotensive requiring a 1 L fluid bolus and room air pulse oximetry of 78% which improved following oxygen supplementation at 2 L to 95%. Patient was also very lethargic while in the ER and the ER physician has requested observation overnight since patient is on opioids at home for chronic pain and hypoxia as well as hypotension may be related to same. Her labs are within normal limits and urine drug screen is positive for opiates and benzodiazepines. CT scan of the head without contrast showed no acute intracranial process.   # AMS and somnolence likely 2/2 medication overdose --Pt on oxycodone and Xanax at home.  May have overdosed, however, pt adamantly denied.  Pt more alert the next day, however, still somewhat confused. PLAN: --Hold home oxycodone and Xanax for now  # Acute hypoxic respiratory failure likely 2/2 somnolence and hypoventilation --Pt was somnolent in the ED, which prompted admission for observation.  ED nurse noted "78% on RA, placed on 2L via [place], sats up to 95% on 2L."  O2 requirement resolved as pt became more  alert. PLAN: --Monitor O2 sat with vitals checks  Hypotension, POA --received IVF in the ED --Hold home oxycodone and Xanax for now --Hold home cardizem for now  Bilateral lower extremity weakness Right knee pain Patient with recent CVA but no focal deficits.  Pt reported having received corticosteroid injections in both knees which caused severe pain in the right knee afterwards. PLAN: --PT  Chronic pain on chronic opioids Anxiety on chronic benzo --May have overdosed. --Hold home oxy and Xanax for now  History of recent ischemic CVA involving the frontal lobe Continue atorvastatin  History of atrial fibrillation Rate controlled  --Home metop resumed but home cardizem held on admission, presumably due to hypotension PLAN: --continue metop  --Hold home cardizem --Continue Pradaxa    DVT prophylaxis: NH:AFBXUXY  Code Status: Full code  Family Communication:  Status is: observation Dispo:   The patient is from: home Anticipated d/c is to: SNF rehab Anticipated d/c date is: whenever bed available Patient currently is medically stable to d/c.   Subjective and Interval History:  Pt reported having no memories of being in the ED and working with the PT.  Awake and alert this morning, asking for her home oxycodone and Xanax which were held on presentation.  Pt reported having diarrhea which was new.  Also right knee pain.   Objective: Vitals:   02/08/20 0809 02/08/20 1154 02/08/20 1225 02/08/20 2204  BP: 118/76 119/76 122/77 119/81  Pulse: (!) 103 80 91 93  Resp:  20 16 20   Temp:  )  97.5 F (36.4 C) 97.6 F (36.4 C) 98.5 F (36.9 C)  TempSrc:  Oral Oral Oral  SpO2:  98%  100%  Weight:      Height:        Intake/Output Summary (Last 24 hours) at 02/09/2020 0255 Last data filed at 02/08/2020 1451 Gross per 24 hour  Intake 819.86 ml  Output 700 ml  Net 119.86 ml   Filed Weights   02/08/20 0021  Weight: 55.4 kg    Examination:   Constitutional:  NAD, alert, oriented to self and hospital only HEENT: conjunctivae and lids normal, EOMI CV: RRR no M,R,G. Distal pulses +2.  No cyanosis.   RESP: CTA B/L, normal respiratory effort  GI: +BS, NTND Extremities: No effusions, edema, or tenderness in BLE MSK: both knees non-edematous and non-tender to palpation SKIN: warm, dry and intact Neuro: II - XII grossly intact.  Sensation intact Psych: Normal mood and affect.     Data Reviewed: I have personally reviewed following labs and imaging studies  CBC: Recent Labs  Lab 02/06/20 2125 02/08/20 0451  WBC 7.9 12.5*  HGB 13.3 12.0  HCT 42.0 36.7  MCV 77.2* 75.7*  PLT 220 206   Basic Metabolic Panel: Recent Labs  Lab 02/06/20 2125 02/08/20 0451  NA 137 140  K 3.7 3.9  CL 100 108  CO2 26 25  GLUCOSE 161* 138*  BUN 22 21  CREATININE 0.92 0.53  CALCIUM 9.9 9.0   GFR: Estimated Creatinine Clearance: 62.1 mL/min (by C-G formula based on SCr of 0.53 mg/dL). Liver Function Tests: No results for input(s): AST, ALT, ALKPHOS, BILITOT, PROT, ALBUMIN in the last 168 hours. No results for input(s): LIPASE, AMYLASE in the last 168 hours. No results for input(s): AMMONIA in the last 168 hours. Coagulation Profile: No results for input(s): INR, PROTIME in the last 168 hours. Cardiac Enzymes: No results for input(s): CKTOTAL, CKMB, CKMBINDEX, TROPONINI in the last 168 hours. BNP (last 3 results) No results for input(s): PROBNP in the last 8760 hours. HbA1C: No results for input(s): HGBA1C in the last 72 hours. CBG: No results for input(s): GLUCAP in the last 168 hours. Lipid Profile: No results for input(s): CHOL, HDL, LDLCALC, TRIG, CHOLHDL, LDLDIRECT in the last 72 hours. Thyroid Function Tests: No results for input(s): TSH, T4TOTAL, FREET4, T3FREE, THYROIDAB in the last 72 hours. Anemia Panel: No results for input(s): VITAMINB12, FOLATE, FERRITIN, TIBC, IRON, RETICCTPCT in the last 72 hours. Sepsis Labs: No results for  input(s): PROCALCITON, LATICACIDVEN in the last 168 hours.  Recent Results (from the past 240 hour(s))  Respiratory Panel by RT PCR (Flu A&B, Covid) - Nasopharyngeal Swab     Status: None   Collection Time: 02/07/20  8:15 AM   Specimen: Nasopharyngeal Swab  Result Value Ref Range Status   SARS Coronavirus 2 by RT PCR NEGATIVE NEGATIVE Final    Comment: (NOTE) SARS-CoV-2 target nucleic acids are NOT DETECTED.  The SARS-CoV-2 RNA is generally detectable in upper respiratoy specimens during the acute phase of infection. The lowest concentration of SARS-CoV-2 viral copies this assay can detect is 131 copies/mL. A negative result does not preclude SARS-Cov-2 infection and should not be used as the sole basis for treatment or other patient management decisions. A negative result may occur with  improper specimen collection/handling, submission of specimen other than nasopharyngeal swab, presence of viral mutation(s) within the areas targeted by this assay, and inadequate number of viral copies (<131 copies/mL). A negative result must be combined  with clinical observations, patient history, and epidemiological information. The expected result is Negative.  Fact Sheet for Patients:  https://www.moore.com/https://www.fda.gov/media/142436/download  Fact Sheet for Healthcare Providers:  https://www.young.biz/https://www.fda.gov/media/142435/download  This test is no t yet approved or cleared by the [place] FDA and  has been authorized for detection and/or diagnosis of SARS-CoV-2 by FDA under an Emergency Use Authorization (EUA). This EUA will remain  in effect (meaning this test can be used) for the duration of the COVID-19 declaration under Section 564(b)(1) of the Act, 21 U.S.C. section 360bbb-3(b)(1), unless the authorization is terminated or revoked sooner.     Influenza A by PCR NEGATIVE NEGATIVE Final   Influenza B by PCR NEGATIVE NEGATIVE Final    Comment: (NOTE) The Xpert Xpress SARS-CoV-2/FLU/RSV assay is intended  as an aid in  the diagnosis of influenza from Nasopharyngeal swab specimens and  should not be used as a sole basis for treatment. Nasal washings and  aspirates are unacceptable for Xpert Xpress SARS-CoV-2/FLU/RSV  testing.  Fact Sheet for Patients: https://www.moore.com/https://www.fda.gov/media/142436/download  Fact Sheet for Healthcare Providers: https://www.young.biz/https://www.fda.gov/media/142435/download  This test is not yet approved or cleared by the [place] FDA and  has been authorized for detection and/or diagnosis of SARS-CoV-2 by  FDA under an Emergency Use Authorization (EUA). This EUA will remain  in effect (meaning this test can be used) for the duration of the  Covid-19 declaration under Section 564(b)(1) of the Act, 21  U.S.C. section 360bbb-3(b)(1), unless the authorization is  terminated or revoked. Performed at [facility], [address] [place], [place] [id number]       Radiology Studies: CT Head Wo Contrast  Result Date: 02/07/2020 CLINICAL DATA:  Altered mental status, possible stroke EXAM: CT HEAD WITHOUT CONTRAST TECHNIQUE: Contiguous axial images were obtained from the base of the skull through the vertex without intravenous contrast. COMPARISON:  Recent prior CT scan of the head 01/14/2020; prior brain MRI 01/16/2020 FINDINGS: Brain: No evidence of acute infarction, hemorrhage, hydrocephalus, extra-axial collection or mass lesion/mass effect. Multifocal regions of focal encephalomalacia and decreased attenuation including the right frontal, right parietal, left parietal and left inferior frontal lobes consistent with multiple prior ischemic infarcts. The pattern is unchanged compared to relatively recent prior imaging. Additionally, there is both central and cortical volume loss. Central volume loss resultant ex vacuo dilatation of the lateral ventricles. Periventricular, subcortical and deep white matter hypoattenuation also present consistent with chronic microvascular ischemic white  matter disease. Vascular: No hyperdense vessel or unexpected calcification. Skull: Normal. Negative for fracture or focal lesion. Sinuses/Orbits: No acute finding. Other: None. IMPRESSION: 1. No acute intracranial process. 2. Stable pattern of multifocal bilateral remote ischemic infarcts, cortical and central atrophy, ex vacuo dilation of the lateral ventricles and chronic microvascular ischemic white matter disease. Electronically Signed   By: Malachy MoanHeath  McCullough M.D.   On: 02/07/2020 09:19   DG Chest Portable 1 View  Result Date: 02/07/2020 CLINICAL DATA:  Weakness, difficulty ambulating EXAM: PORTABLE CHEST 1 VIEW COMPARISON:  None. FINDINGS: Normal heart size and vascularity. Lungs remain clear. No focal pneumonia, collapse or consolidation. Negative for edema, effusion pneumothorax. Trachea midline. Aorta atherosclerotic. Mild scoliosis of the spine. Calcified breast implants noted. Additional round left upper quadrant peripherally calcified abnormality noted, of uncertain origin by plain radiography. This also may related to prior breast surgery. No available comparison studies. IMPRESSION: No active chest disease.  See above comment. Aortic Atherosclerosis (ICD10-I70.0). Electronically Signed   By: Judie PetitM.  Shick M.D.   On: 02/07/2020 10:56  Scheduled Meds:  atorvastatin  40 mg Oral Daily   dabigatran  150 mg Oral Q12H   metoprolol succinate  50 mg Oral Q breakfast   multivitamin with minerals  1 tablet Oral Daily   nicotine  7 mg Transdermal Daily   polyethylene glycol  17 g Oral BID   sodium chloride flush  3 mL Intravenous Q12H   Continuous Infusions:   LOS: 0 days     Darlin Priestly, MD [facility] If 7PM-7AM, please contact night-coverage 02/09/2020, 2:55 AM

## 2020-02-08 NOTE — Care Management Obs Status (Signed)
MEDICARE OBSERVATION STATUS NOTIFICATION   Patient Details  Name: Julie Harmon MRN: [id number] Date of Birth: [birth year]   Medicare Observation Status Notification Given:  Yes   Emailed copy to daughter  Chapman Fitch, RN 02/08/2020, 4:01 PM

## 2020-02-08 NOTE — NC FL2 (Signed)
[place] MEDICAID FL2 LEVEL OF CARE SCREENING TOOL     IDENTIFICATION  Patient Name: Julie Harmon Birthdate: 08-Oct-1955 Sex: female Admission Date (Current Location): 02/07/2020  [place] and [place] Number:  Chiropodist and Address:  [facility], [address] [place], [place] [address]      Provider Number: [id number]  Attending Physician Name and Address:  Darlin Priestly, MD  Relative Name and Phone Number:       Current Level of Care: Hospital Recommended Level of Care: Skilled Nursing Facility Prior Approval Number:    Date Approved/Denied:   PASRR Number: [id number]  Discharge Plan: SNF    Current Diagnoses: Patient Active Problem List   Diagnosis Date Noted  . Toxic encephalopathy 02/07/2020  . Lower extremity weakness 02/07/2020  . Bilious vomiting with nausea   . Leukocytosis   . Atrial fibrillation (HCC)   . Chronic pain syndrome   . Dyslipidemia   . Protein-calorie malnutrition, severe 01/19/2020  . 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 Expressive aphasia 01/15/2020    Orientation RESPIRATION BLADDER Height & Weight     Self, Place  Normal Incontinent, External catheter Weight: 55.4 kg Height:  5\' 6"  (167.6 cm)  BEHAVIORAL SYMPTOMS/MOOD NEUROLOGICAL BOWEL NUTRITION STATUS      Continent Diet (2 gram sodium)  AMBULATORY STATUS COMMUNICATION OF NEEDS Skin   Extensive Assist Verbally Normal                       Personal Care Assistance Level of Assistance              Functional Limitations Info             SPECIAL CARE FACTORS FREQUENCY  PT (By licensed PT), OT (By licensed OT)                    Contractures Contractures Info: Not present    Additional Factors Info  Code Status, Allergies Code Status Info: Full Allergies Info: Iodine, morphine           Current Medications (02/08/2020):  This is the current hospital active medication  list Current Facility-Administered Medications  Medication Dose Route Frequency Provider Last Rate Last Admin  . acetaminophen (TYLENOL) tablet 650 mg  650 mg Oral Q4H PRN Agbata, Tochukwu, MD      . atorvastatin (LIPITOR) tablet 40 mg  40 mg Oral Daily Agbata, Tochukwu, MD   40 mg at 02/08/20 0849  . dabigatran (PRADAXA) capsule 150 mg  150 mg Oral Q12H Agbata, Tochukwu, MD   150 mg at 02/07/20 1547  . loperamide (IMODIUM) capsule 2 mg  2 mg Oral PRN Agbata, Tochukwu, MD      . metoprolol succinate (TOPROL-XL) 24 hr tablet 50 mg  50 mg Oral Q breakfast Agbata, Tochukwu, MD   50 mg at 02/08/20 0848  . multivitamin with minerals tablet 1 tablet  1 tablet Oral Daily Agbata, Tochukwu, MD   1 tablet at 02/08/20 0849  . nicotine (NICODERM CQ - dosed in mg/24 hr) patch 7 mg  7 mg Transdermal Daily Agbata, Tochukwu, MD      . ondansetron (ZOFRAN) tablet 4 mg  4 mg Oral Q6H PRN Agbata, Tochukwu, MD       Or  . ondansetron (ZOFRAN) injection 4 mg  4 mg Intravenous Q6H PRN Agbata, Tochukwu, MD      . polyethylene glycol (MIRALAX / GLYCOLAX) packet 17  g  17 g Oral BID Agbata, Tochukwu, MD      . sodium chloride flush (NS) 0.9 % injection 3 mL  3 mL Intravenous Q12H Agbata, Tochukwu, MD   3 mL at 02/08/20 1008     Discharge Medications: Please see discharge summary for a list of discharge medications.  Relevant Imaging Results:  Relevant Lab Results:   Additional Information ss [id number]  Chapman Fitch, RN

## 2020-02-09 ENCOUNTER — Encounter: Payer: Medicare (Managed Care) | Admitting: Physical Medicine & Rehabilitation

## 2020-02-09 DIAGNOSIS — Z681 Body mass index (BMI) 19 or less, adult: Secondary | ICD-10-CM | POA: Diagnosis not present

## 2020-02-09 DIAGNOSIS — Z9882 Breast implant status: Secondary | ICD-10-CM | POA: Diagnosis not present

## 2020-02-09 DIAGNOSIS — I6389 Other cerebral infarction: Secondary | ICD-10-CM | POA: Diagnosis present

## 2020-02-09 DIAGNOSIS — M25561 Pain in right knee: Secondary | ICD-10-CM | POA: Diagnosis present

## 2020-02-09 DIAGNOSIS — E43 Unspecified severe protein-calorie malnutrition: Secondary | ICD-10-CM | POA: Diagnosis present

## 2020-02-09 DIAGNOSIS — F419 Anxiety disorder, unspecified: Secondary | ICD-10-CM | POA: Diagnosis present

## 2020-02-09 DIAGNOSIS — I4819 Other persistent atrial fibrillation: Secondary | ICD-10-CM | POA: Diagnosis not present

## 2020-02-09 DIAGNOSIS — E785 Hyperlipidemia, unspecified: Secondary | ICD-10-CM | POA: Diagnosis present

## 2020-02-09 DIAGNOSIS — Z79899 Other long term (current) drug therapy: Secondary | ICD-10-CM | POA: Diagnosis not present

## 2020-02-09 DIAGNOSIS — R4182 Altered mental status, unspecified: Secondary | ICD-10-CM | POA: Diagnosis present

## 2020-02-09 DIAGNOSIS — Z7901 Long term (current) use of anticoagulants: Secondary | ICD-10-CM | POA: Diagnosis not present

## 2020-02-09 DIAGNOSIS — Z888 Allergy status to other drugs, medicaments and biological substances status: Secondary | ICD-10-CM | POA: Diagnosis not present

## 2020-02-09 DIAGNOSIS — I69320 Aphasia following cerebral infarction: Secondary | ICD-10-CM | POA: Diagnosis not present

## 2020-02-09 DIAGNOSIS — Z79891 Long term (current) use of opiate analgesic: Secondary | ICD-10-CM | POA: Diagnosis not present

## 2020-02-09 DIAGNOSIS — I639 Cerebral infarction, unspecified: Secondary | ICD-10-CM | POA: Diagnosis not present

## 2020-02-09 DIAGNOSIS — G894 Chronic pain syndrome: Secondary | ICD-10-CM | POA: Diagnosis present

## 2020-02-09 DIAGNOSIS — R531 Weakness: Secondary | ICD-10-CM | POA: Diagnosis present

## 2020-02-09 DIAGNOSIS — Z885 Allergy status to narcotic agent status: Secondary | ICD-10-CM | POA: Diagnosis not present

## 2020-02-09 DIAGNOSIS — J9601 Acute respiratory failure with hypoxia: Secondary | ICD-10-CM | POA: Diagnosis present

## 2020-02-09 DIAGNOSIS — I959 Hypotension, unspecified: Secondary | ICD-10-CM | POA: Diagnosis present

## 2020-02-09 DIAGNOSIS — Z20822 Contact with and (suspected) exposure to covid-19: Secondary | ICD-10-CM | POA: Diagnosis present

## 2020-02-09 LAB — CBC
HCT: 36.6 % (ref 36.0–46.0)
Hemoglobin: 11.9 g/dL — ABNORMAL LOW (ref 12.0–15.0)
MCH: 24.9 pg — ABNORMAL LOW (ref 26.0–34.0)
MCHC: 32.5 g/dL (ref 30.0–36.0)
MCV: 76.7 fL — ABNORMAL LOW (ref 80.0–100.0)
Platelets: 235 10*3/uL (ref 150–400)
RBC: 4.77 MIL/uL (ref 3.87–5.11)
RDW: 16.4 % — ABNORMAL HIGH (ref 11.5–15.5)
WBC: 11.7 10*3/uL — ABNORMAL HIGH (ref 4.0–10.5)
nRBC: 0 % (ref 0.0–0.2)

## 2020-02-09 LAB — BASIC METABOLIC PANEL
Anion gap: 9 (ref 5–15)
BUN: 27 mg/dL — ABNORMAL HIGH (ref 8–23)
CO2: 25 mmol/L (ref 22–32)
Calcium: 8.9 mg/dL (ref 8.9–10.3)
Chloride: 104 mmol/L (ref 98–111)
Creatinine, Ser: 0.83 mg/dL (ref 0.44–1.00)
GFR calc Af Amer: >60 mL/min (ref >60)
GFR calc non Af Amer: >60 mL/min (ref >60)
Glucose, Bld: 102 mg/dL — ABNORMAL HIGH (ref 70–99)
Potassium: 3.8 mmol/L (ref 3.5–5.1)
Sodium: 138 mmol/L (ref 135–145)

## 2020-02-09 LAB — MAGNESIUM: Magnesium: 2.2 mg/dL (ref 1.7–2.4)

## 2020-02-09 MED ORDER — DILTIAZEM HCL ER COATED BEADS 300 MG PO CP24
300.0000 mg | ORAL_CAPSULE | Freq: Every day | ORAL | Status: DC
  Administered 2020-02-09 – 2020-02-15 (×7): 300 mg via ORAL
  Filled 2020-02-09 (×8): qty 1

## 2020-02-09 MED ORDER — OXYCODONE HCL 5 MG PO TABS
10.0000 mg | ORAL_TABLET | Freq: Three times a day (TID) | ORAL | Status: DC | PRN
  Administered 2020-02-10 – 2020-02-15 (×7): 10 mg via ORAL
  Filled 2020-02-09 (×7): qty 2

## 2020-02-09 NOTE — Progress Notes (Signed)
Physical Therapy Treatment Patient Details Name: Julie Harmon MRN: [id number] DOB: 07-Dec-1955 Today's Date: 02/09/2020    History of Present Illness Julie Harmon is a 64 y.o. female with medical history significant for chronic pain syndrome, history of CVA, history of A. fib and dyslipidemia who was brought into the ER by EMS for evaluation of lower extremity weakness.  Patient received corticosteroid injections in both knees and since then has been complaining of weakness in her legs. Pt with increasing confusion since arrival.  She was recently discharged from acute rehab following an ischemic CVA of the frontal lobe.     PT Comments    Pt's HR more appropriate for PT this afternoon, however she has continued to have hallucinations and confusion; this remains true through out this PT session.  PT was able to convince her to try and get up and do some walking, but ultimately she was very limited, very confused and did not manage to do more than 3 limited standing bouts and on attempt to take a step became hyper reactive with the attempted weight shift and overall displayed poor function with mobility, transfers and inability to walk. Plan for STR remains appropriate.   Follow Up Recommendations  SNF     Equipment Recommendations  Rolling walker with 5" wheels    Recommendations for Other Services       Precautions / Restrictions Precautions Precautions: Fall Restrictions Weight Bearing Restrictions: No    Mobility  Bed Mobility Overal bed mobility: Needs Assistance Bed Mobility: Supine to Sit;Sit to Supine     Supine to sit: Mod assist;Max assist Sit to supine: Max assist   General bed mobility comments: Pt showed some effort, but ultimately could not manage more than some minimal rolling before calling out in pain - needed heavy assist to attain sitting  Transfers Overall transfer level: Needs assistance Equipment used: Rolling walker (2 wheeled) Transfers: Sit to/from  Stand Sit to Stand: Mod assist;Max assist         General transfer comment: multiple sit to stand attmpts, pt needing heavy cuing, encouragement and ultimately heavy assist to attain standing each time  Ambulation/Gait             General Gait Details: Unable to do any true ambulation. Pt very unsteady and anxious, managed to take one step and called out in pain (R LE WBing) and impulsively reacted needing heavy assist to insure she landed back on the bed safely   Stairs             Wheelchair Mobility    Modified Rankin (Stroke Patients Only)       Balance Overall balance assessment: Needs assistance Sitting-balance support: Feet supported Sitting balance-Leahy Scale: Fair     Standing balance support: Bilateral upper extremity supported Standing balance-Leahy Scale: Poor Standing balance comment: Generally very unsteady and anxious, though statically she was able to maintain standing with heavy walker reliance and min assist from PT to insure she kept weight forward on walker                            Cognition Arousal/Alertness: Awake/alert Behavior During Therapy: Impulsive;Anxious;Restless Overall Cognitive Status: Impaired/Different from baseline Area of Impairment: Orientation;Memory                                      Exercises  General Comments        Pertinent Vitals/Pain Pain Assessment: Faces Pain Score:  (unable to rate) Faces Pain Scale: Hurts whole lot Pain Location: R knee/foot, also reports significant back pain    Home Living                      Prior Function            PT Goals (current goals can now be found in the care plan section) Progress towards PT goals: Not progressing toward goals - comment    Frequency    Min 2X/week      PT Plan Current plan remains appropriate    Co-evaluation              AM-PAC PT "6 Clicks" Mobility   Outcome Measure  Help needed  turning from your back to your side while in a flat bed without using bedrails?: Total Help needed moving from lying on your back to sitting on the side of a flat bed without using bedrails?: Total Help needed moving to and from a bed to a chair (including a wheelchair)?: Total Help needed standing up from a chair using your arms (e.g., wheelchair or bedside chair)?: Total Help needed to walk in hospital room?: Total Help needed climbing 3-5 steps with a railing? : Total 6 Click Score: 6    End of Session Equipment Utilized During Treatment: Gait belt Activity Tolerance: Patient limited by pain Patient left: in bed;with call bell/phone within reach;with nursing/sitter in room Nurse Communication: Mobility status PT Visit Diagnosis: Muscle weakness (generalized) (M62.81);Difficulty in walking, not elsewhere classified (R26.2);Pain Pain - Right/Left: Left Pain - part of body: Leg     Time: 0454-0981 PT Time Calculation (min) (ACUTE ONLY): 25 min  Charges:  $Therapeutic Activity: 23-37 mins                     Malachi Pro, DPT 02/09/2020, 6:04 PM

## 2020-02-09 NOTE — Progress Notes (Signed)
Patients sister, Eunice Blase, called with numerous questions regarding patients medications and is requesting a consult with neurology, sister stated that she and patients daughter don't want patient sent to rehab until they have answers to their questions.   Patient is currently having hallucinations and is very confused. She is seeing childrens toys and a dog in the hallway. Patient has told me the dentist is seeing her today for her knee and is now talking about the people from the airport coming to get her. Secure chat sent to Dr. Fran Lowes regarding the above.   Charge nurse also notified of confusion and hallucinations, patient has made no attempts to get out of bed but will move to a room closer to nurses station when a room becomes available.

## 2020-02-10 ENCOUNTER — Inpatient Hospital Stay: Payer: Medicare (Managed Care)

## 2020-02-10 DIAGNOSIS — I4819 Other persistent atrial fibrillation: Secondary | ICD-10-CM | POA: Diagnosis not present

## 2020-02-10 DIAGNOSIS — I639 Cerebral infarction, unspecified: Secondary | ICD-10-CM

## 2020-02-10 LAB — BASIC METABOLIC PANEL
Anion gap: 11 (ref 5–15)
BUN: 26 mg/dL — ABNORMAL HIGH (ref 8–23)
CO2: 25 mmol/L (ref 22–32)
Calcium: 9.6 mg/dL (ref 8.9–10.3)
Chloride: 99 mmol/L (ref 98–111)
Creatinine, Ser: 0.67 mg/dL (ref 0.44–1.00)
GFR calc Af Amer: >60 mL/min (ref >60)
GFR calc non Af Amer: >60 mL/min (ref >60)
Glucose, Bld: 102 mg/dL — ABNORMAL HIGH (ref 70–99)
Potassium: 4.1 mmol/L (ref 3.5–5.1)
Sodium: 135 mmol/L (ref 135–145)

## 2020-02-10 LAB — CBC
HCT: 42 % (ref 36.0–46.0)
Hemoglobin: 14.4 g/dL (ref 12.0–15.0)
MCH: 25.6 pg — ABNORMAL LOW (ref 26.0–34.0)
MCHC: 34.3 g/dL (ref 30.0–36.0)
MCV: 74.6 fL — ABNORMAL LOW (ref 80.0–100.0)
Platelets: 256 10*3/uL (ref 150–400)
RBC: 5.63 MIL/uL — ABNORMAL HIGH (ref 3.87–5.11)
RDW: 16.9 % — ABNORMAL HIGH (ref 11.5–15.5)
WBC: 12.5 10*3/uL — ABNORMAL HIGH (ref 4.0–10.5)
nRBC: 0 % (ref 0.0–0.2)

## 2020-02-10 LAB — MAGNESIUM: Magnesium: 2.2 mg/dL (ref 1.7–2.4)

## 2020-02-10 MED ORDER — ALPRAZOLAM 0.25 MG PO TABS
0.2500 mg | ORAL_TABLET | Freq: Two times a day (BID) | ORAL | Status: DC | PRN
  Administered 2020-02-10 – 2020-02-15 (×4): 0.25 mg via ORAL
  Filled 2020-02-10 (×4): qty 1

## 2020-02-10 NOTE — Consult Note (Signed)
Reason for Consult:Difficulty with gait Requesting Physician: Fran LowesLai  CC: Difficulty with gait  I have been asked by Dr. Fran LowesLai to see this patient in consultation for difficulty with gait.  HPI: Julie LeverSusan Harmon is an 64 y.o. female with medical history significant for chronic pain syndrome, CVA (multiple), atrial fibrillation on Pradaxa and dyslipidemia who was brought into the ER by EMS for evaluation of lower extremity weakness.  Patient received corticosteroid injections in both knees and since then has been complaining of weakness in her legs.   Patient recently admitted on 9/5 with acute left frontal infarct.  Was then transferred to inpatient rehab and discharged on 9/23.  On discharge records show that she had LLE strength at 4+/5 and ability to ambulate for [facility] feet [facility] with some aphasia.  Family reports patient is now confused and unable to walk at all.  Family reports also that although patient had an infarct was told that her gait abnormalities were secondary to some other neurological problem for which she needed to be seen by a neurologist on an outpatient basis.   In the emergency room patient was noted to be hypotensive requiring a 1 L fluid bolus and room air pulse oximetry of 78% which improved following oxygen supplementation at 2 L to 95%. Patient on pain opiates and benzodiazepines prior to admission.    Past medical history: HLD, afib, CVA, hypothyroidism  Surgical history: Bladder/uterine suspension  Family history: Daughter alive and well  Social History:  Recent past history of tobacco abuse  Allergies  Allergen Reactions  . Iodine Swelling  . Morphine Nausea Only    Medications:  I have reviewed the patient's current medications. Prior to Admission:  Medications Prior to Admission  Medication Sig Dispense Refill Last Dose  . ALPRAZolam (XANAX) 0.25 MG tablet Take 1 tablet (0.25 mg total) by mouth 2 (two) times daily as needed for anxiety. 30 tablet 0 Past Week at  Unknown time  . atorvastatin (LIPITOR) 40 MG tablet Take 1 tablet (40 mg total) by mouth daily. 30 tablet 0 Past Week at Unknown time  . dabigatran (PRADAXA) 150 MG CAPS capsule Take 1 capsule (150 mg total) by mouth every 12 (twelve) hours. 60 capsule 0 Past Week at Unknown time  . diltiazem (CARDIZEM CD) 300 MG 24 hr capsule Take 1 capsule (300 mg total) by mouth daily. 30 capsule 0 Past Week at Unknown time  . metoprolol succinate (TOPROL-XL) 50 MG 24 hr tablet Take 1 tablet (50 mg total) by mouth daily. Take with or immediately following a meal. 30 tablet 0 Past Week at Unknown time  . Multiple Vitamin (MULTIVITAMIN WITH MINERALS) TABS tablet Take 1 tablet by mouth daily.   Past Week at Unknown time  . nicotine (NICODERM CQ - DOSED IN MG/24 HOURS) 14 mg/24hr patch 14 mg patch daily x2 weeks then 7 mg patch daily x3 weeks and stop 28 patch 0 Past Week at Unknown time  . oxyCODONE 10 MG TABS Take 1 tablet (10 mg total) by mouth every 8 (eight) hours as needed for severe pain. 20 tablet 0 Past Week at Unknown time  . polyethylene glycol (MIRALAX / GLYCOLAX) 17 g packet Take 17 g by mouth 2 (two) times daily. 14 each 0 Past Week at Unknown time  . acetaminophen (TYLENOL) 325 MG tablet Take 2 tablets (650 mg total) by mouth every 4 (four) hours as needed for mild pain or moderate pain (or temp > 37.5 C (99.5 F)).  unknown at prn  . loperamide (IMODIUM) 2 MG capsule Take 1 capsule (2 mg total) by mouth as needed for diarrhea or loose stools. 30 capsule 0 unknown at prn   Scheduled: . atorvastatin  40 mg Oral Daily  . dabigatran  150 mg Oral Q12H  . diltiazem  300 mg Oral Daily  . metoprolol succinate  50 mg Oral Q breakfast  . multivitamin with minerals  1 tablet Oral Daily  . nicotine  7 mg Transdermal Daily  . polyethylene glycol  17 g Oral BID  . sodium chloride flush  3 mL Intravenous Q12H    ROS: History obtained from the patient  General ROS: negative for - chills, fatigue, fever,  night sweats, weight gain or weight loss Psychological ROS: negative for - behavioral disorder, hallucinations, memory difficulties, mood swings or suicidal ideation Ophthalmic ROS: negative for - blurry vision, double vision, eye pain or loss of vision ENT ROS: negative for - epistaxis, nasal discharge, oral lesions, sore throat, tinnitus or vertigo Allergy and Immunology ROS: negative for - hives or itchy/watery eyes Hematological and Lymphatic ROS: negative for - bleeding problems, bruising or swollen lymph nodes Endocrine ROS: negative for - galactorrhea, hair pattern changes, polydipsia/polyuria or temperature intolerance Respiratory ROS: negative for - cough, hemoptysis, shortness of breath or wheezing Cardiovascular ROS: negative for - chest pain, dyspnea on exertion, edema or irregular heartbeat Gastrointestinal ROS: negative for - abdominal pain, diarrhea, hematemesis, nausea/vomiting or stool incontinence Genito-Urinary ROS: negative for - dysuria, hematuria, incontinence or urinary frequency/urgency Musculoskeletal ROS: negative for - joint swelling or muscular weakness Neurological ROS: as noted in HPI Dermatological ROS: negative for rash and skin lesion changes  Physical Examination: Blood pressure 129/90, pulse (!) 107, temperature 98.3 F (36.8 C), temperature source Oral, resp. rate 16, height  (1.676 m), weight 55.4 kg, SpO2 100 %.  HEENT-  Normocephalic, no lesions, without obvious abnormality.  Normal external eye and conjunctiva.  Normal TM's bilaterally.  Normal auditory canals and external ears. Normal external nose, mucus membranes and septum.  Normal pharynx. Cardiovascular- S1, S2 normal, pulses palpable throughout   Lungs- chest clear, no wheezing, rales, normal symmetric air entry Abdomen- soft, non-tender; bowel sounds normal; no masses,  no organomegaly Extremities- no edema Lymph-no adenopathy palpable Musculoskeletal-no joint tenderness, deformity or  swelling Skin-warm and dry, no hyperpigmentation, vitiligo, or suspicious lesions  Neurological Examination   Mental Status: Alert, oriented to name and place but not to year and month.  Mold aphasia noted with word finding difficulties and paraphasic errors noted.  Able to follow 3 step commands without difficulty. Cranial Nerves: II: Discs flat bilaterally; Visual fields grossly normal, pupils equal, round, reactive to light and accommodation III,IV, VI: ptosis not present, extra-ocular motions intact bilaterally V,VII: decrease in right NLF, facial light touch sensation normal bilaterally VIII: hearing normal bilaterally IX,X: gag reflex present XI: bilateral shoulder shrug XII: midline tongue extension Motor: Right upper extremity pronator drift noted.  Unable to lift the RLE off the bed.  Full strength noted in the LUE and LLE. Sensory: Pinprick and light touch decreased in the RUE and RLE Deep Tendon Reflexes: Increased on the right Plantars: Right: upgoing   Left: downgoing Cerebellar: normal finger-to-nose testing bilaterally Gait: not tested due to safety concerns    Laboratory Studies:   Basic Metabolic Panel: Recent Labs  Lab 02/06/20 2125 02/06/20 2125 02/08/20 0451 02/09/20 0406 02/10/20 0517  NA 137  --  140 138 135  K 3.7  --  3.9 3.8 4.1  CL 100  --  108 104 99  CO2 26  --  25 25 25   GLUCOSE 161*  --  138* 102* 102*  BUN 22  --  21 27* 26*  CREATININE 0.92  --  0.53 0.83 0.67  CALCIUM 9.9   < > 9.0 8.9 9.6  MG  --   --   --  2.2 2.2   < > = values in this interval not displayed.    Liver Function Tests: No results for input(s): AST, ALT, ALKPHOS, BILITOT, PROT, ALBUMIN in the last 168 hours. No results for input(s): LIPASE, AMYLASE in the last 168 hours. No results for input(s): AMMONIA in the last 168 hours.  CBC: Recent Labs  Lab 02/06/20 2125 02/08/20 0451 02/09/20 0406 02/10/20 0645  WBC 7.9 12.5* 11.7* 12.5*  HGB 13.3 12.0 11.9* 14.4   HCT 42.0 36.7 36.6 42.0  MCV 77.2* 75.7* 76.7* 74.6*  PLT 220 206 235 256    Cardiac Enzymes: No results for input(s): CKTOTAL, CKMB, CKMBINDEX, TROPONINI in the last 168 hours.  BNP: Invalid input(s): POCBNP  CBG: No results for input(s): GLUCAP in the last 168 hours.  Microbiology: Results for orders placed or performed during the hospital encounter of 02/07/20  Respiratory Panel by RT PCR (Flu A&B, Covid) - Nasopharyngeal Swab     Status: None   Collection Time: 02/07/20  8:15 AM   Specimen: Nasopharyngeal Swab  Result Value Ref Range Status   SARS Coronavirus 2 by RT PCR NEGATIVE NEGATIVE Final    Comment: (NOTE) SARS-CoV-2 target nucleic acids are NOT DETECTED.  The SARS-CoV-2 RNA is generally detectable in upper respiratoy specimens during the acute phase of infection. The lowest concentration of SARS-CoV-2 viral copies this assay can detect is 131 copies/mL. A negative result does not preclude SARS-Cov-2 infection and should not be used as the sole basis for treatment or other patient management decisions. A negative result may occur with  improper specimen collection/handling, submission of specimen other than nasopharyngeal swab, presence of viral mutation(s) within the areas targeted by this assay, and inadequate number of viral copies (<131 copies/mL). A negative result must be combined with clinical observations, patient history, and epidemiological information. The expected result is Negative.  Fact Sheet for Patients:  02/09/20  Fact Sheet for Healthcare Providers:  https://www.moore.com/  This test is no t yet approved or cleared by the https://www.young.biz/ FDA and  has been authorized for detection and/or diagnosis of SARS-CoV-2 by FDA under an Emergency Use Authorization (EUA). This EUA will remain  in effect (meaning this test can be used) for the duration of the COVID-19 declaration under Section  564(b)(1) of the Act, 21 U.S.C. section 360bbb-3(b)(1), unless the authorization is terminated or revoked sooner.     Influenza A by PCR NEGATIVE NEGATIVE Final   Influenza B by PCR NEGATIVE NEGATIVE Final    Comment: (NOTE) The Xpert Xpress SARS-CoV-2/FLU/RSV assay is intended as an aid in  the diagnosis of influenza from Nasopharyngeal swab specimens and  should not be used as a sole basis for treatment. Nasal washings and  aspirates are unacceptable for Xpert Xpress SARS-CoV-2/FLU/RSV  testing.  Fact Sheet for Patients: [place]  Fact Sheet for Healthcare Providers: https://www.moore.com/  This test is not yet approved or cleared by the https://www.young.biz/ FDA and  has been authorized for detection and/or diagnosis of SARS-CoV-2 by  FDA under an Emergency Use Authorization (EUA). This EUA will remain  in effect (  meaning this test can be used) for the duration of the  Covid-19 declaration under Section 564(b)(1) of the Act, 21  U.S.C. section 360bbb-3(b)(1), unless the authorization is  terminated or revoked. Performed at [facility], [address] [place], [place] [address]     Coagulation Studies: No results for input(s): LABPROT, INR in the last 72 hours.  Urinalysis:  Recent Labs  Lab 02/07/20 0815  COLORURINE YELLOW*  LABSPEC 1.023  PHURINE 5.0  GLUCOSEU NEGATIVE  HGBUR NEGATIVE  BILIRUBINUR NEGATIVE  KETONESUR NEGATIVE  PROTEINUR NEGATIVE  NITRITE NEGATIVE  LEUKOCYTESUR NEGATIVE    Lipid Panel:     Component Value Date/Time   CHOL 117 01/15/2020 0227   TRIG 47 01/15/2020 0227   HDL 37 (L) 01/15/2020 0227   CHOLHDL 3.2 01/15/2020 0227   VLDL 9 01/15/2020 0227   LDLCALC 71 01/15/2020 0227    HgbA1C:  Lab Results  Component Value Date   HGBA1C 5.8 (H) 01/15/2020    Urine Drug Screen:      Component Value Date/Time   LABOPIA POSITIVE (A) 02/07/2020 0815   COCAINSCRNUR NONE DETECTED  02/07/2020 0815   LABBENZ POSITIVE (A) 02/07/2020 0815   AMPHETMU NONE DETECTED 02/07/2020 0815   THCU NONE DETECTED 02/07/2020 0815   LABBARB NONE DETECTED 02/07/2020 0815    Alcohol Level: No results for input(s): ETH in the last 168 hours.  Other results: EKG (01/14/2020): atrial fibrillation, rate 112 bpm.  Imaging: No results found.   Assessment/Plan: 64 y.o. female with medical history significant for chronic pain syndrome, CVA (multiple), atrial fibrillation on Pradaxa and dyslipidemia who was brought into the ER by EMS for evaluation of lower extremity weakness.  Patient received corticosteroid injections in both knees and since then has been complaining of weakness in her legs.   Patient recently admitted on 9/5 with acute left frontal infarct.  Was then transferred to inpatient rehab and discharged on 9/23.  On discharge records show that she had LLE strength at 4+/5 and ability to ambulate for 200 feet at [facility] with some aphasia.  Family reports patient is now confused and unable to walk at all.  Family reports also that although patient had an infarct was told that her gait abnormalities were secondary to some other neurological problem for which she needed to be seen by a neurologist on an outpatient basis.   In the emergency room patient was noted to be hypotensive requiring a 1 L fluid bolus and room air pulse oximetry of 78% which improved following oxygen supplementation at 2 L to 95%. Patient on pain opiates and benzodiazepines since prior to admission.  Also from review of her outside records patient with acute infarct 05/10/2019 of left parietal lobe in MCA distribution with multiple bilateral chronic infarcts noted.   In comparison with discharge examination from rehab patient does appear functionally worse.  This is focal though and may suggest a new infarct or an encephalopathy that is making her baseline deficits more apparent.  Patient's white blood cell count is mildly  elevated but patient has no fever and no other evident source of infection or significant metabolic abnormalities therefore am concerned about a recurrent infarct despite patient is on anticoagulation. In current setting of worsening RLE weakness and history of multiple infarcts in multiple vascular distibutions unable to evaluate gait and truly rule out that previous gait issues are related not only to acute infarcts but compensation for previous vascular insults.     Recommendations: 1. MRI of  the brain without contrast 2. Continue anticoagulation 3. Would restart benzodiazepine at outpatient dose.  Patient with multiple previous infarcts and abrupt withdrawal may place her at increased risk for development of seizures. 4. EEG 5. Will continue to follow with you.      Thana Farr, MD Neurology [phone] 02/10/2020, 1:01 PM

## 2020-02-10 NOTE — Progress Notes (Signed)
PROGRESS NOTE    Julie Harmon  WLS:[id number] DOB: 04-11-1956 DOA: 02/07/2020 PCP: Tarri Fuller, FNP    Assessment & Plan:   Principal Problem:   Lower extremity weakness Active Problems:   Ischemic cerebrovascular accident (CVA) of frontal lobe (HCC)   Atrial fibrillation (HCC)   Chronic pain syndrome   AMS (altered mental status)    Julie Harmon is a 64 y.o. female with medical history significant for chronic pain syndrome, history of CVA, history of A. fib and dyslipidemia who was brought into the ER by EMS for evaluation of lower extremity weakness.  Patient received corticosteroid injections in both knees and since then has been complaining of weakness in her legs.  She is oriented to person and place but not to time.  She was recently discharged from acute rehab following an ischemic CVA of the frontal lobe.  While in the emergency room patient was noted to be hypotensive requiring a 1 L fluid bolus and room air pulse oximetry of 78% which improved following oxygen supplementation at 2 L to 95%. Patient was also very lethargic while in the ER and the ER physician has requested observation overnight since patient is on opioids at home for chronic pain and hypoxia as well as hypotension may be related to same. Her labs are within normal limits and urine drug screen is positive for opiates and benzodiazepines. CT scan of the head without contrast showed no acute intracranial process.   # AMS and somnolence likely 2/2 sedation medications --Pt on oxycodone and Xanax at home.  May have overdosed, however, pt adamantly denied.  Pt more alert the next day, however, still very confused.  Family said pt only had 1 dose of oxy and Xanax on the day of presentation. PLAN: --cont home oxy --resume home Zanax PRN today  # Acute hypoxic respiratory failure likely 2/2 somnolence and hypoventilation, resolved --Pt was somnolent in the ED, which prompted admission for observation.  ED nurse  noted "78% on RA, placed on 2L via [place], sats up to 95% on 2L."  O2 requirement resolved as pt became more alert.  Hypotension, POA, resolved --received IVF in the ED --cont home cardizem and metop  Bilateral lower extremity weakness Right knee pain Patient with multiple recent CVA's.  Pt reported having received corticosteroid injections in both knees which caused severe pain in the right knee afterwards.   --Family reported pt unable to bear weight on her legs.  Family reported outpatient ortho confirmed nothing wrong with the knees to cause her inability to walk. PLAN: --PT rec SNF --neuro consult today  Chronic pain on chronic opioids Anxiety on chronic benzo --cont home oxy PRN --resume home Zanax PRN today  History of recent ischemic CVA involving the frontal lobe --recently finished inpatient rehab --continue statin  History of atrial fibrillation Rate controlled  PLAN: --continue home cardizem and metop --Continue Pradaxa    DVT prophylaxis: OT:[id number]  Code Status: Full code  Family Communication:  Status is: changed to inpatient Dispo:   The patient is from: home Anticipated d/c is to: SNF rehab Anticipated d/c date is: 1-2 days Patient currently is not medically stable to d/c.  Still very confused, can't bear weight, currently being worked up by neuro.   Subjective and Interval History:  Pt continued to report severe pain in her knees, but has not requested much of her home oxycodone.  Said she didn't like the food, but ate well.     Objective: Vitals:  02/10/20 0632 02/10/20 1022 02/10/20 1114 02/10/20 1610  BP: (!) 121/93 129/90  110/71  Pulse: 87 (!) 107  83  Resp:      Temp: 98.1 F (36.7 C)  98.3 F (36.8 C)   TempSrc:   Oral   SpO2: 100%     Weight:      Height:        Intake/Output Summary (Last 24 hours) at 02/10/2020 1759 Last data filed at 02/10/2020 1411 Gross per 24 hour  Intake 0 ml  Output --  Net 0 ml   Filed Weights    02/08/20 0021  Weight: 55.4 kg    Examination:   Constitutional: NAD, alert, oriented to person only, confused HEENT: conjunctivae and lids normal, EOMI CV: No cyanosis.   RESP: normal respiratory effort, on RA Extremities: No effusions, edema in BLE, knees appeared normal SKIN: warm, dry and intact Neuro: II - XII grossly intact.   Psych: pleasant mood and affect.     Data Reviewed: I have personally reviewed following labs and imaging studies  CBC: Recent Labs  Lab 02/06/20 2125 02/08/20 0451 02/09/20 0406 02/10/20 0645  WBC 7.9 12.5* 11.7* 12.5*  HGB 13.3 12.0 11.9* 14.4  HCT 42.0 36.7 36.6 42.0  MCV 77.2* 75.7* 76.7* 74.6*  PLT 220 206 235 256   Basic Metabolic Panel: Recent Labs  Lab 02/06/20 2125 02/08/20 0451 02/09/20 0406 02/10/20 0517  NA 137 140 138 135  K 3.7 3.9 3.8 4.1  CL 100 108 104 99  CO2 26 25 25 25   GLUCOSE 161* 138* 102* 102*  BUN 22 21 27* 26*  CREATININE 0.92 0.53 0.83 0.67  CALCIUM 9.9 9.0 8.9 9.6  MG  --   --  2.2 2.2   GFR: Estimated Creatinine Clearance: 62.1 mL/min (by C-G formula based on SCr of 0.67 mg/dL). Liver Function Tests: No results for input(s): AST, ALT, ALKPHOS, BILITOT, PROT, ALBUMIN in the last 168 hours. No results for input(s): LIPASE, AMYLASE in the last 168 hours. No results for input(s): AMMONIA in the last 168 hours. Coagulation Profile: No results for input(s): INR, PROTIME in the last 168 hours. Cardiac Enzymes: No results for input(s): CKTOTAL, CKMB, CKMBINDEX, TROPONINI in the last 168 hours. BNP (last 3 results) No results for input(s): PROBNP in the last 8760 hours. HbA1C: No results for input(s): HGBA1C in the last 72 hours. CBG: No results for input(s): GLUCAP in the last 168 hours. Lipid Profile: No results for input(s): CHOL, HDL, LDLCALC, TRIG, CHOLHDL, LDLDIRECT in the last 72 hours. Thyroid Function Tests: No results for input(s): TSH, T4TOTAL, FREET4, T3FREE, THYROIDAB in the last 72  hours. Anemia Panel: No results for input(s): VITAMINB12, FOLATE, FERRITIN, TIBC, IRON, RETICCTPCT in the last 72 hours. Sepsis Labs: No results for input(s): PROCALCITON, LATICACIDVEN in the last 168 hours.  Recent Results (from the past 240 hour(s))  Respiratory Panel by RT PCR (Flu A&B, Covid) - Nasopharyngeal Swab     Status: None   Collection Time: 02/07/20  8:15 AM   Specimen: Nasopharyngeal Swab  Result Value Ref Range Status   SARS Coronavirus 2 by RT PCR NEGATIVE NEGATIVE Final    Comment: (NOTE) SARS-CoV-2 target nucleic acids are NOT DETECTED.  The SARS-CoV-2 RNA is generally detectable in upper respiratoy specimens during the acute phase of infection. The lowest concentration of SARS-CoV-2 viral copies this assay can detect is 131 copies/mL. A negative result does not preclude SARS-Cov-2 infection and should not be used as the  sole basis for treatment or other patient management decisions. A negative result may occur with  improper specimen collection/handling, submission of specimen other than nasopharyngeal swab, presence of viral mutation(s) within the areas targeted by this assay, and inadequate number of viral copies (<131 copies/mL). A negative result must be combined with clinical observations, patient history, and epidemiological information. The expected result is Negative.  Fact Sheet for Patients:  https://www.moore.com/  Fact Sheet for Healthcare Providers:  https://www.young.biz/  This test is no t yet approved or cleared by the [place] FDA and  has been authorized for detection and/or diagnosis of SARS-CoV-2 by FDA under an Emergency Use Authorization (EUA). This EUA will remain  in effect (meaning this test can be used) for the duration of the COVID-19 declaration under Section 564(b)(1) of the Act, 21 U.S.C. section 360bbb-3(b)(1), unless the authorization is terminated or revoked sooner.     Influenza  A by PCR NEGATIVE NEGATIVE Final   Influenza B by PCR NEGATIVE NEGATIVE Final    Comment: (NOTE) The Xpert Xpress SARS-CoV-2/FLU/RSV assay is intended as an aid in  the diagnosis of influenza from Nasopharyngeal swab specimens and  should not be used as a sole basis for treatment. Nasal washings and  aspirates are unacceptable for Xpert Xpress SARS-CoV-2/FLU/RSV  testing.  Fact Sheet for Patients: https://www.moore.com/  Fact Sheet for Healthcare Providers: https://www.young.biz/  This test is not yet approved or cleared by the [place] FDA and  has been authorized for detection and/or diagnosis of SARS-CoV-2 by  FDA under an Emergency Use Authorization (EUA). This EUA will remain  in effect (meaning this test can be used) for the duration of the  Covid-19 declaration under Section 564(b)(1) of the Act, 21  U.S.C. section 360bbb-3(b)(1), unless the authorization is  terminated or revoked. Performed at [facility], [address] [place], [place] [address]       Radiology Studies: No results found.   Scheduled Meds: . atorvastatin  40 mg Oral Daily  . dabigatran  150 mg Oral Q12H  . diltiazem  300 mg Oral Daily  . metoprolol succinate  50 mg Oral Q breakfast  . multivitamin with minerals  1 tablet Oral Daily  . nicotine  7 mg Transdermal Daily  . polyethylene glycol  17 g Oral BID  . sodium chloride flush  3 mL Intravenous Q12H   Continuous Infusions:   LOS: 1 day     Darlin Priestly, MD [facility] If 7PM-7AM, please contact night-coverage 02/10/2020, 5:59 PM

## 2020-02-10 NOTE — Progress Notes (Signed)
PROGRESS NOTE    Julie Harmon  PFX:[id number] DOB: 1956-03-10 DOA: 02/07/2020 PCP: Tarri Fuller, FNP    Assessment & Plan:   Principal Problem:   Lower extremity weakness Active Problems:   Ischemic cerebrovascular accident (CVA) of frontal lobe (HCC)   Atrial fibrillation (HCC)   Chronic pain syndrome   AMS (altered mental status)    Julie Harmon is a 64 y.o. female with medical history significant for chronic pain syndrome, history of CVA, history of A. fib and dyslipidemia who was brought into the ER by EMS for evaluation of lower extremity weakness.  Patient received corticosteroid injections in both knees and since then has been complaining of weakness in her legs.  She is oriented to person and place but not to time.  She was recently discharged from acute rehab following an ischemic CVA of the frontal lobe.  While in the emergency room patient was noted to be hypotensive requiring a 1 L fluid bolus and room air pulse oximetry of 78% which improved following oxygen supplementation at 2 L to 95%. Patient was also very lethargic while in the ER and the ER physician has requested observation overnight since patient is on opioids at home for chronic pain and hypoxia as well as hypotension may be related to same. Her labs are within normal limits and urine drug screen is positive for opiates and benzodiazepines. CT scan of the head without contrast showed no acute intracranial process.   # AMS and somnolence likely 2/2 sedation medications --Pt on oxycodone and Xanax at home.  May have overdosed, however, pt adamantly denied.  Pt more alert the next day, however, still very confused.  Family said pt only had 1 dose of oxy and Xanax on the day of presentation. PLAN: --resume home oxy --continue to hold home Xanax  # Acute hypoxic respiratory failure likely 2/2 somnolence and hypoventilation, resolved --Pt was somnolent in the ED, which prompted admission for observation.  ED  nurse noted "78% on RA, placed on 2L via [place], sats up to 95% on 2L."  O2 requirement resolved as pt became more alert. PLAN: --Monitor O2 sat with vitals checks  Hypotension, POA, resolved --received IVF in the ED --Home cardizem and metop both resumed.  Bilateral lower extremity weakness Right knee pain Patient with multiple recent CVA's.  Pt reported having received corticosteroid injections in both knees which caused severe pain in the right knee afterwards.   --Family reported pt unable to bear weight on her legs.  Family reported outpatient ortho confirmed nothing wrong with the knees to cause her inability to walk. PLAN: --PT --will obtain Neurology consult, per family request  Chronic pain on chronic opioids Anxiety on chronic benzo --resume home oxy PRN now that pt is alert and awake --continue to hold home Xanax  History of recent ischemic CVA involving the frontal lobe --recently finished inpatient rehab --continue statin  History of atrial fibrillation Rate controlled  PLAN: --continue home cardizem and metop --Continue Pradaxa    DVT prophylaxis: [id number]  Code Status: Full code  Family Communication: daughter updated on the phone today Status is: changed to inpatient Dispo:   The patient is from: home Anticipated d/c is to: SNF rehab Anticipated d/c date is: 1-2 days Patient currently is not medically stable to d/c.  Still very confused, can't bear weight, will obtain neurology consult.   Subjective and Interval History:  Nursing reported pt very confused and hallucinating.  Pt continued to complain to me of knee  pain when asked, however, when nurse assessed pain level, pt denied having pain.    PT worked with pt again, and found pt to have very limited mobility.  Per family, previous doctors had suggested that pt may have neurological problem causing her to have trouble walking.   Objective: Vitals:   02/10/20 0632 02/10/20 1022 02/10/20 1114  02/10/20 1610  BP: (!) 121/93 129/90  110/71  Pulse: 87 (!) 107  83  Resp:      Temp: 98.1 F (36.7 C)  98.3 F (36.8 C)   TempSrc:   Oral   SpO2: 100%     Weight:      Height:        Intake/Output Summary (Last 24 hours) at 02/10/2020 1704 Last data filed at 02/10/2020 1411 Gross per 24 hour  Intake 0 ml  Output --  Net 0 ml   Filed Weights   02/08/20 0021  Weight: 55.4 kg    Examination:   Constitutional: NAD, alert, oriented to self only, confused HEENT: conjunctivae and lids normal, EOMI CV: No cyanosis.   RESP: normal respiratory effort, on RA Extremities: No effusions, edema in BLE.  Knees appeared normal. SKIN: warm, dry and intact Neuro: II - XII grossly intact.     Data Reviewed: I have personally reviewed following labs and imaging studies  CBC: Recent Labs  Lab 02/06/20 2125 02/08/20 0451 02/09/20 0406 02/10/20 0645  WBC 7.9 12.5* 11.7* 12.5*  HGB 13.3 12.0 11.9* 14.4  HCT 42.0 36.7 36.6 42.0  MCV 77.2* 75.7* 76.7* 74.6*  PLT 220 206 235 256   Basic Metabolic Panel: Recent Labs  Lab 02/06/20 2125 02/08/20 0451 02/09/20 0406 02/10/20 0517  NA 137 140 138 135  K 3.7 3.9 3.8 4.1  CL 100 108 104 99  CO2 26 25 25 25   GLUCOSE 161* 138* 102* 102*  BUN 22 21 27* 26*  CREATININE 0.92 0.53 0.83 0.67  CALCIUM 9.9 9.0 8.9 9.6  MG  --   --  2.2 2.2   GFR: Estimated Creatinine Clearance: 62.1 mL/min (by C-G formula based on SCr of 0.67 mg/dL). Liver Function Tests: No results for input(s): AST, ALT, ALKPHOS, BILITOT, PROT, ALBUMIN in the last 168 hours. No results for input(s): LIPASE, AMYLASE in the last 168 hours. No results for input(s): AMMONIA in the last 168 hours. Coagulation Profile: No results for input(s): INR, PROTIME in the last 168 hours. Cardiac Enzymes: No results for input(s): CKTOTAL, CKMB, CKMBINDEX, TROPONINI in the last 168 hours. BNP (last 3 results) No results for input(s): PROBNP in the last 8760 hours. HbA1C: No  results for input(s): HGBA1C in the last 72 hours. CBG: No results for input(s): GLUCAP in the last 168 hours. Lipid Profile: No results for input(s): CHOL, HDL, LDLCALC, TRIG, CHOLHDL, LDLDIRECT in the last 72 hours. Thyroid Function Tests: No results for input(s): TSH, T4TOTAL, FREET4, T3FREE, THYROIDAB in the last 72 hours. Anemia Panel: No results for input(s): VITAMINB12, FOLATE, FERRITIN, TIBC, IRON, RETICCTPCT in the last 72 hours. Sepsis Labs: No results for input(s): PROCALCITON, LATICACIDVEN in the last 168 hours.  Recent Results (from the past 240 hour(s))  Respiratory Panel by RT PCR (Flu A&B, Covid) - Nasopharyngeal Swab     Status: None   Collection Time: 02/07/20  8:15 AM   Specimen: Nasopharyngeal Swab  Result Value Ref Range Status   SARS Coronavirus 2 by RT PCR NEGATIVE NEGATIVE Final    Comment: (NOTE) SARS-CoV-2 target nucleic acids are  NOT DETECTED.  The SARS-CoV-2 RNA is generally detectable in upper respiratoy specimens during the acute phase of infection. The lowest concentration of SARS-CoV-2 viral copies this assay can detect is 131 copies/mL. A negative result does not preclude SARS-Cov-2 infection and should not be used as the sole basis for treatment or other patient management decisions. A negative result may occur with  improper specimen collection/handling, submission of specimen other than nasopharyngeal swab, presence of viral mutation(s) within the areas targeted by this assay, and inadequate number of viral copies (<131 copies/mL). A negative result must be combined with clinical observations, patient history, and epidemiological information. The expected result is Negative.  Fact Sheet for Patients:  https://www.moore.com/  Fact Sheet for Healthcare Providers:  https://www.young.biz/  This test is no t yet approved or cleared by the [place] FDA and  has been authorized for detection and/or  diagnosis of SARS-CoV-2 by FDA under an Emergency Use Authorization (EUA). This EUA will remain  in effect (meaning this test can be used) for the duration of the COVID-19 declaration under Section 564(b)(1) of the Act, 21 U.S.C. section 360bbb-3(b)(1), unless the authorization is terminated or revoked sooner.     Influenza A by PCR NEGATIVE NEGATIVE Final   Influenza B by PCR NEGATIVE NEGATIVE Final    Comment: (NOTE) The Xpert Xpress SARS-CoV-2/FLU/RSV assay is intended as an aid in  the diagnosis of influenza from Nasopharyngeal swab specimens and  should not be used as a sole basis for treatment. Nasal washings and  aspirates are unacceptable for Xpert Xpress SARS-CoV-2/FLU/RSV  testing.  Fact Sheet for Patients: https://www.moore.com/  Fact Sheet for Healthcare Providers: https://www.young.biz/  This test is not yet approved or cleared by the [place] FDA and  has been authorized for detection and/or diagnosis of SARS-CoV-2 by  FDA under an Emergency Use Authorization (EUA). This EUA will remain  in effect (meaning this test can be used) for the duration of the  Covid-19 declaration under Section 564(b)(1) of the Act, 21  U.S.C. section 360bbb-3(b)(1), unless the authorization is  terminated or revoked. Performed at [facility], [address] [place], [place] [address]       Radiology Studies: No results found.   Scheduled Meds: . atorvastatin  40 mg Oral Daily  . dabigatran  150 mg Oral Q12H  . diltiazem  300 mg Oral Daily  . metoprolol succinate  50 mg Oral Q breakfast  . multivitamin with minerals  1 tablet Oral Daily  . nicotine  7 mg Transdermal Daily  . polyethylene glycol  17 g Oral BID  . sodium chloride flush  3 mL Intravenous Q12H   Continuous Infusions:   LOS: 1 day     Darlin Priestly, MD [facility] If 7PM-7AM, please contact night-coverage 02/10/2020, 5:04 PM

## 2020-02-11 ENCOUNTER — Inpatient Hospital Stay: Payer: Medicare (Managed Care)

## 2020-02-11 DIAGNOSIS — I4819 Other persistent atrial fibrillation: Secondary | ICD-10-CM | POA: Diagnosis not present

## 2020-02-11 DIAGNOSIS — I639 Cerebral infarction, unspecified: Secondary | ICD-10-CM | POA: Diagnosis not present

## 2020-02-11 LAB — MAGNESIUM: Magnesium: 2.5 mg/dL — ABNORMAL HIGH (ref 1.7–2.4)

## 2020-02-11 LAB — BASIC METABOLIC PANEL
Anion gap: 9 (ref 5–15)
BUN: 36 mg/dL — ABNORMAL HIGH (ref 8–23)
CO2: 26 mmol/L (ref 22–32)
Calcium: 8.9 mg/dL (ref 8.9–10.3)
Chloride: 103 mmol/L (ref 98–111)
Creatinine, Ser: 0.84 mg/dL (ref 0.44–1.00)
GFR calc Af Amer: >60 mL/min (ref >60)
GFR calc non Af Amer: >60 mL/min (ref >60)
Glucose, Bld: 94 mg/dL (ref 70–99)
Potassium: 4.3 mmol/L (ref 3.5–5.1)
Sodium: 138 mmol/L (ref 135–145)

## 2020-02-11 LAB — CBC
HCT: 43.3 % (ref 36.0–46.0)
Hemoglobin: 14.6 g/dL (ref 12.0–15.0)
MCH: 24.7 pg — ABNORMAL LOW (ref 26.0–34.0)
MCHC: 33.7 g/dL (ref 30.0–36.0)
MCV: 73.4 fL — ABNORMAL LOW (ref 80.0–100.0)
Platelets: 268 10*3/uL (ref 150–400)
RBC: 5.9 MIL/uL — ABNORMAL HIGH (ref 3.87–5.11)
RDW: 16.9 % — ABNORMAL HIGH (ref 11.5–15.5)
WBC: 10.6 10*3/uL — ABNORMAL HIGH (ref 4.0–10.5)
nRBC: 0 % (ref 0.0–0.2)

## 2020-02-11 NOTE — Progress Notes (Addendum)
PROGRESS NOTE    Julie LeverSusan Harmon  ZOX:[id number] DOB: 1955/12/29 DOA: 02/07/2020 PCP: Tarri FullerMalfi, Nicole M, FNP    Assessment & Plan:   Principal Problem:   Lower extremity weakness Active Problems:   Ischemic cerebrovascular accident (CVA) of frontal lobe (HCC)   Atrial fibrillation (HCC)   Chronic pain syndrome   AMS (altered mental status)    Julie Harmon is a 64 y.o. female with medical history significant for chronic pain syndrome, history of CVA, history of A. fib and dyslipidemia who was brought into the ER by EMS for evaluation of lower extremity weakness.  Patient received corticosteroid injections in both knees and since then has been complaining of weakness in her legs.  She is oriented to person and place but not to time.  She was recently discharged from acute rehab following an ischemic CVA of the frontal lobe.  While in the emergency room patient was noted to be hypotensive requiring a 1 L fluid bolus and room air pulse oximetry of 78% which improved following oxygen supplementation at 2 L to 95%. Patient was also very lethargic while in the ER and the ER physician has requested observation overnight since patient is on opioids at home for chronic pain and hypoxia as well as hypotension may be related to same. Her labs are within normal limits and urine drug screen is positive for opiates and benzodiazepines. CT scan of the head without contrast showed no acute intracranial process.   # AMS and somnolence likely 2/2 sedation medications --Pt on oxycodone and Xanax at home.  May have overdosed, however, pt adamantly denied.  Pt more alert the next day, however, still very confused.  Family said pt only had 1 dose of oxy and Xanax on the day of presentation. PLAN: --cont home oxy and Zanax PRN  # Acute hypoxic respiratory failure likely 2/2 somnolence and hypoventilation, resolved --Pt was somnolent in the ED, which prompted admission for observation.  ED nurse noted "78% on RA,  placed on 2L via [place], sats up to 95% on 2L."  O2 requirement resolved as pt became more alert.  Hypotension, POA, resolved --received IVF in the ED.  BP wnl now. --cont home cardizem and metop  Bilateral lower extremity weakness Right knee pain Patient with multiple recent CVA's.  Pt reported having received corticosteroid injections in both knees which caused severe pain in the right knee afterwards.   --Family reported pt unable to bear weight on her legs.  Family reported outpatient ortho confirmed nothing wrong with the knees to cause her inability to walk. --neuro consulted PLAN: --PT rec SNF --repeat xray of right femur and right knee today, no acute finding. --no further workup suggested by neuro  Chronic pain on chronic opioids Anxiety on chronic benzo --cont home oxy and Zanax PRN  History of recent ischemic CVA involving the frontal lobe Extension of infarct in the setting of hypotension --recently finished inpatient rehab --repeat MRI yesterday showed "Interval mild extension of the Left MCA middle division infarct diagnosed by MRI on 01/15/2020. PLAN: --avoid hypotension --continue statin --no further workup suggested by neuro  History of atrial fibrillation Rate controlled  PLAN: --continue home cardizem and metop --cont home pradaxa, cleared by neuro   DVT prophylaxis: WJ:[id number]:Pradaxa  Code Status: Full code  Family Communication: daughter and sister updated on the phone today (~30 minutes, all questions answered).  Status is: changed to inpatient Dispo:   The patient is from: home Anticipated d/c is to: SNF rehab Anticipated d/c  date is: whenever bed available  Patient currently is medically stable to d/c.     Subjective and Interval History:  Pt continued to complain of pain in her right thigh and right knee.  Xray of femur and knee showed no acute finding.   Objective: Vitals:   02/11/20 0409 02/11/20 0924 02/11/20 0925 02/11/20 1217  BP: 124/87  124/77  118/82  Pulse: 99 (!) 56 66 (!) 101  Resp: 16   18  Temp: 97.9 F (36.6 C)   99.2 F (37.3 C)  TempSrc:    Oral  SpO2: 96% 96%  95%  Weight:      Height:        Intake/Output Summary (Last 24 hours) at 02/11/2020 1507 Last data filed at 02/11/2020 1012 Gross per 24 hour  Intake 480 ml  Output 200 ml  Net 280 ml   Filed Weights   02/08/20 0021  Weight: 55.4 kg    Examination:   Constitutional: NAD, alert, oriented to person and place HEENT: conjunctivae and lids normal, EOMI CV: RRR no M,R,G. Distal pulses +2.  No cyanosis.   RESP: CTA B/L, normal respiratory effort  GI: +BS, NTND Extremities: No effusions, edema in BLE.  No tenderness to palpation over right thigh and right knee.  Pain in right leg with resistance.   SKIN: warm, dry and intact Neuro: II - XII grossly intact.  Sensation intact   Data Reviewed: I have personally reviewed following labs and imaging studies  CBC: Recent Labs  Lab 02/06/20 2125 02/08/20 0451 02/09/20 0406 02/10/20 0645 02/11/20 0421  WBC 7.9 12.5* 11.7* 12.5* 10.6*  HGB 13.3 12.0 11.9* 14.4 14.6  HCT 42.0 36.7 36.6 42.0 43.3  MCV 77.2* 75.7* 76.7* 74.6* 73.4*  PLT 220 206 235 256 268   Basic Metabolic Panel: Recent Labs  Lab 02/06/20 2125 02/08/20 0451 02/09/20 0406 02/10/20 0517 02/11/20 0421  NA 137 140 138 135 138  K 3.7 3.9 3.8 4.1 4.3  CL 100 108 104 99 103  CO2 26 25 25 25 26   GLUCOSE 161* 138* 102* 102* 94  BUN 22 21 27* 26* 36*  CREATININE 0.92 0.53 0.83 0.67 0.84  CALCIUM 9.9 9.0 8.9 9.6 8.9  MG  --   --  2.2 2.2 2.5*   GFR: Estimated Creatinine Clearance: 59.2 mL/min (by C-G formula based on SCr of 0.84 mg/dL). Liver Function Tests: No results for input(s): AST, ALT, ALKPHOS, BILITOT, PROT, ALBUMIN in the last 168 hours. No results for input(s): LIPASE, AMYLASE in the last 168 hours. No results for input(s): AMMONIA in the last 168 hours. Coagulation Profile: No results for input(s): INR, PROTIME  in the last 168 hours. Cardiac Enzymes: No results for input(s): CKTOTAL, CKMB, CKMBINDEX, TROPONINI in the last 168 hours. BNP (last 3 results) No results for input(s): PROBNP in the last 8760 hours. HbA1C: No results for input(s): HGBA1C in the last 72 hours. CBG: No results for input(s): GLUCAP in the last 168 hours. Lipid Profile: No results for input(s): CHOL, HDL, LDLCALC, TRIG, CHOLHDL, LDLDIRECT in the last 72 hours. Thyroid Function Tests: No results for input(s): TSH, T4TOTAL, FREET4, T3FREE, THYROIDAB in the last 72 hours. Anemia Panel: No results for input(s): VITAMINB12, FOLATE, FERRITIN, TIBC, IRON, RETICCTPCT in the last 72 hours. Sepsis Labs: No results for input(s): PROCALCITON, LATICACIDVEN in the last 168 hours.  Recent Results (from the past 240 hour(s))  Respiratory Panel by RT PCR (Flu A&B, Covid) - Nasopharyngeal Swab  Status: None   Collection Time: 02/07/20  8:15 AM   Specimen: Nasopharyngeal Swab  Result Value Ref Range Status   SARS Coronavirus 2 by RT PCR NEGATIVE NEGATIVE Final    Comment: (NOTE) SARS-CoV-2 target nucleic acids are NOT DETECTED.  The SARS-CoV-2 RNA is generally detectable in upper respiratoy specimens during the acute phase of infection. The lowest concentration of SARS-CoV-2 viral copies this assay can detect is 131 copies/mL. A negative result does not preclude SARS-Cov-2 infection and should not be used as the sole basis for treatment or other patient management decisions. A negative result may occur with  improper specimen collection/handling, submission of specimen other than nasopharyngeal swab, presence of viral mutation(s) within the areas targeted by this assay, and inadequate number of viral copies (<131 copies/mL). A negative result must be combined with clinical observations, patient history, and epidemiological information. The expected result is Negative.  Fact Sheet for Patients:    https://www.moore.com/  Fact Sheet for Healthcare Providers:  https://www.young.biz/  This test is no t yet approved or cleared by the [place] FDA and  has been authorized for detection and/or diagnosis of SARS-CoV-2 by FDA under an Emergency Use Authorization (EUA). This EUA will remain  in effect (meaning this test can be used) for the duration of the COVID-19 declaration under Section 564(b)(1) of the Act, 21 U.S.C. section 360bbb-3(b)(1), unless the authorization is terminated or revoked sooner.     Influenza A by PCR NEGATIVE NEGATIVE Final   Influenza B by PCR NEGATIVE NEGATIVE Final    Comment: (NOTE) The Xpert Xpress SARS-CoV-2/FLU/RSV assay is intended as an aid in  the diagnosis of influenza from Nasopharyngeal swab specimens and  should not be used as a sole basis for treatment. Nasal washings and  aspirates are unacceptable for Xpert Xpress SARS-CoV-2/FLU/RSV  testing.  Fact Sheet for Patients: https://www.moore.com/  Fact Sheet for Healthcare Providers: https://www.young.biz/  This test is not yet approved or cleared by the [place] FDA and  has been authorized for detection and/or diagnosis of SARS-CoV-2 by  FDA under an Emergency Use Authorization (EUA). This EUA will remain  in effect (meaning this test can be used) for the duration of the  Covid-19 declaration under Section 564(b)(1) of the Act, 21  U.S.C. section 360bbb-3(b)(1), unless the authorization is  terminated or revoked. Performed at [facility], [address], [place], [place] [address]       Radiology Studies: DG Knee 1-2 Views Right  Result Date: 02/11/2020 CLINICAL DATA:  Pt states she fell 3 wks ago. Increased weakness x 1 wk with increasing pain in right leg. Pt also had a recent CVA. Pt points to mid-distal right femur as area of pain. EXAM: RIGHT KNEE - 1-2 VIEW COMPARISON:  None.  FINDINGS: No evidence of fracture, dislocation, or joint effusion. Mild-to-moderate tricompartmental joint space loss and spurring. Soft tissues are unremarkable. IMPRESSION: No acute osseous abnormality in the right knee. Electronically Signed   By: Emmaline Kluver M.D.   On: 02/11/2020 14:05   MR BRAIN WO CONTRAST  Result Date: 02/10/2020 CLINICAL DATA:  64 year old female with altered mental status, history of atrial fibrillation and stroke. Left hemisphere infarct last month. EXAM: MRI HEAD WITHOUT CONTRAST TECHNIQUE: Multiplanar, multiecho pulse sequences of the brain and surrounding structures were obtained without intravenous contrast. COMPARISON:  Head CT 02/07/2020.  Brain MRI 01/15/2020. FINDINGS: Brain: Expected evolution of the left insula and adjacent operculum cortical diffusion abnormality seen on 01/15/2020. Subacute appearing abnormal diffusion (  isointense to facilitated on ADC) now tracking cephalad from that area into the left corona radiata and centrum semiovale (series 2, image 91 today). Also questionable new linear restricted diffusion in the previously infarcted left anterior corona radiata and basal ganglia on series 2, image 85. No hemorrhage or mass effect associated with the above findings. No other new diffusion abnormality identified. Underlying extensive bilateral chronic cortical infarcts and encephalomalacia involving the bilateral MCA, and left PCA territories. Superimposed chronic small vessel ischemia in the left basal ganglia, left thalamus, bilateral cerebellar artery territories. Mild if any chronic cerebral blood products. No midline shift, mass effect, evidence of mass lesion, ventriculomegaly, extra-axial collection or acute intracranial hemorrhage. Cervicomedullary junction and pituitary are within normal limits. Vascular: Major intracranial vascular flow voids are stable since last month. Skull and upper cervical spine: Partially visible prior cervical ACDF. Bone  marrow signal is stable and within normal limits. Sinuses/Orbits: Stable, negative. Other: Mastoids remain clear.  Negative visible scalp and face. IMPRESSION: 1. Interval mild extension of the Left MCA middle division infarct diagnosed by MRI on 01/15/2020. No associated hemorrhage or mass effect. 2. Suggestion of new acute on chronic small vessel ischemia in the nearby left basal ganglia. No associated hemorrhage or mass effect. 3. Otherwise stable extensive chronic ischemic disease. Electronically Signed   By: Odessa Fleming M.D.   On: 02/10/2020 19:20   DG FEMUR, MIN 2 VIEWS RIGHT  Result Date: 02/11/2020 CLINICAL DATA:  Pt states she fell 3 wks ago. Increased weakness x 1 wk with increasing pain in right leg. Pt also had a recent CVA. Pt points to mid-distal right femur as area of pain. EXAM: RIGHT FEMUR 2 VIEWS COMPARISON:  None. FINDINGS: There is no evidence of fracture or other focal bone lesions. Mild degenerative changes at the right hip. Several surgical clips are noted in the right inguinal region. IMPRESSION: No acute osseous abnormality in the right hip/femur. Electronically Signed   By: Emmaline Kluver M.D.   On: 02/11/2020 14:06     Scheduled Meds:  atorvastatin  40 mg Oral Daily   dabigatran  150 mg Oral Q12H   diltiazem  300 mg Oral Daily   metoprolol succinate  50 mg Oral Q breakfast   multivitamin with minerals  1 tablet Oral Daily   nicotine  7 mg Transdermal Daily   polyethylene glycol  17 g Oral BID   sodium chloride flush  3 mL Intravenous Q12H   Continuous Infusions:   LOS: 2 days     Darlin Priestly, MD [facility] If 7PM-7AM, please contact night-coverage 02/11/2020, 3:07 PM

## 2020-02-11 NOTE — TOC Progression Note (Signed)
Transition of Care [facility]) - Progression Note    Patient Details  Name: Julie Harmon MRN: [id number] Date of Birth: [birth year]  [facility] CM/SW Contact  Eilleen Kempf, LCSW Phone Number: 02/11/2020, 4:47 PM  Clinical Narrative:    Patient remains unstable and requires continued medical work-up    Expected Discharge Plan: Skilled Nursing Facility Barriers to Discharge: Continued Medical Work up  Expected Discharge Plan and Services Expected Discharge Plan: Skilled Nursing Facility                                               Social Determinants of Health (SDOH) Interventions    Readmission Risk Interventions No flowsheet data found.

## 2020-02-11 NOTE — Progress Notes (Signed)
Subjective: She reports that her right leg became weaker about a week ago.  No significant changes overnight, she is complaining of some pain from place where she fell.  Exam: Vitals:   02/11/20 0924 02/11/20 0925  BP: 124/77   Pulse: (!) 56 66  Resp:    Temp:    SpO2: 96%    Gen: In bed, NAD Resp: non-labored breathing, no acute distress Abd: soft, nt  Neuro: MS: Awake, alert, interactive and appropriate CN: EOMI,?  Mildly decreased right nasolabial fold Motor: She has 2-3/5 strength of her right lower extremity, 4+/5 in right upper extremity Sensory: Decreased in the right   Impression: 64 year old female with extension of infarct in the setting of hypotension.  Given that it is already subacute appearing on MRI as well as the report of a week, I do not think there is really any benefit in holding anticoagulation at this point, though we sometimes do an acute stroke.  I do not think her current presentation represents recurrent embolus, but rather is more likely related to hypotension affecting an area that was likely tenuous abutting her previous stroke.  I have discussed this with the patient.  I do not think an EEG is needed since we have an explanation for her worsening exam on her MRI.  Recommendations: 1) continue anticoagulation 2) avoid hypotension 3) continue PT, OT, ST 4) work-up for right leg pain per internal medicine 5) neurology will be available as needed, please call with further questions or concerns.   Ritta Slot, MD [facility] [phone]  If 7pm- 7am, please page neurology on call as listed in AMION.

## 2020-02-12 DIAGNOSIS — I4819 Other persistent atrial fibrillation: Secondary | ICD-10-CM | POA: Diagnosis not present

## 2020-02-12 LAB — CBC
HCT: 43 % (ref 36.0–46.0)
Hemoglobin: 14.6 g/dL (ref 12.0–15.0)
MCH: 25.2 pg — ABNORMAL LOW (ref 26.0–34.0)
MCHC: 34 g/dL (ref 30.0–36.0)
MCV: 74.3 fL — ABNORMAL LOW (ref 80.0–100.0)
Platelets: 302 10*3/uL (ref 150–400)
RBC: 5.79 MIL/uL — ABNORMAL HIGH (ref 3.87–5.11)
RDW: 16.8 % — ABNORMAL HIGH (ref 11.5–15.5)
WBC: 12.8 10*3/uL — ABNORMAL HIGH (ref 4.0–10.5)
nRBC: 0 % (ref 0.0–0.2)

## 2020-02-12 LAB — BASIC METABOLIC PANEL
Anion gap: 9 (ref 5–15)
BUN: 41 mg/dL — ABNORMAL HIGH (ref 8–23)
CO2: 24 mmol/L (ref 22–32)
Calcium: 9 mg/dL (ref 8.9–10.3)
Chloride: 106 mmol/L (ref 98–111)
Creatinine, Ser: 0.86 mg/dL (ref 0.44–1.00)
GFR calc Af Amer: >60 mL/min (ref >60)
GFR calc non Af Amer: >60 mL/min (ref >60)
Glucose, Bld: 107 mg/dL — ABNORMAL HIGH (ref 70–99)
Potassium: 4.1 mmol/L (ref 3.5–5.1)
Sodium: 139 mmol/L (ref 135–145)

## 2020-02-12 LAB — MAGNESIUM: Magnesium: 2.3 mg/dL (ref 1.7–2.4)

## 2020-02-12 MED ORDER — PREGABALIN 50 MG PO CAPS
50.0000 mg | ORAL_CAPSULE | Freq: Two times a day (BID) | ORAL | Status: DC
  Administered 2020-02-12 – 2020-02-15 (×7): 50 mg via ORAL
  Filled 2020-02-12 (×7): qty 1

## 2020-02-12 MED ORDER — DULOXETINE HCL 20 MG PO CPEP
40.0000 mg | ORAL_CAPSULE | Freq: Every day | ORAL | Status: DC
  Administered 2020-02-12 – 2020-02-15 (×4): 40 mg via ORAL
  Filled 2020-02-12 (×4): qty 2

## 2020-02-12 MED ORDER — ACETAMINOPHEN 500 MG PO TABS
1000.0000 mg | ORAL_TABLET | Freq: Three times a day (TID) | ORAL | Status: DC | PRN
  Filled 2020-02-12: qty 2

## 2020-02-12 NOTE — Care Management Important Message (Signed)
Important Message  Patient Details  Name: Julie Harmon MRN: [id number] Date of Birth: June 11, 1955   Medicare Important Message Given:  Yes     [name] 02/12/2020, 12:54 PM

## 2020-02-12 NOTE — TOC Progression Note (Addendum)
Transition of Care [facility]) - Progression Note    Patient Details  Name: Julie Harmon MRN: [id number] Date of Birth: 26-Nov-1955  [facility]) CM/SW Contact  Margarito Liner, LCSW Phone Number: 02/12/2020, 12:36 PM  Clinical Narrative:  CSW called patient's daughter who stated she told CSW on Friday that she did not want to accept the bed offer from [facility]. First preference is [facility]. Per daughter, patient has [facility]. The insurance representative told her there is a "lift" for evacuees until the end of October so they can get the level of care they need. CSW left a voicemail for [facility] admissions coordinator asking him to review again.   1:50 pm: CSW spoke to [employer]. He will call [employer] representative that daughter has been in contact with.  Expected Discharge Plan: Skilled Nursing Facility Barriers to Discharge: Continued Medical Work up  Expected Discharge Plan and Services Expected Discharge Plan: Skilled Nursing Facility                                               Social Determinants of Health (SDOH) Interventions    Readmission Risk Interventions No flowsheet data found.

## 2020-02-12 NOTE — Progress Notes (Signed)
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 female with medical history significant for chronic pain syndrome, history of CVA, history of A. fib and dyslipidemia who was brought into the ER by EMS for evaluation of lower extremity weakness.  Patient received corticosteroid injections in both knees and since then has been complaining of weakness in her legs.  She is oriented to person and place but not to time.  She was recently discharged from acute rehab following an ischemic CVA of the frontal lobe.  While in the emergency room patient was noted to be hypotensive requiring a 1 L fluid bolus and room air pulse oximetry of 78% which improved following oxygen supplementation at 2 L to 95%. Patient was also very lethargic while in the ER and the ER physician has requested observation overnight since patient is on opioids at home for chronic pain and hypoxia as well as hypotension may be related to same. Her labs are within normal limits and urine drug screen is positive for opiates and benzodiazepines. CT scan of the head without contrast showed no acute intracranial process.   # AMS and somnolence likely 2/2 sedation medications --Pt on oxycodone and Xanax at home.  May have overdosed, however, pt adamantly denied.  Pt more alert the next day, however, still very confused.  Family said pt only had 1 dose of oxy and Xanax on the day of presentation. PLAN: --cont home oxy and Zanax PRN --avoid over sedation  # Acute hypoxic respiratory failure likely 2/2 somnolence and hypoventilation, resolved --Pt was somnolent in the ED, which prompted admission for observation.  ED  nurse noted "78% on RA, placed on 2L via [place], sats up to 95% on 2L."  O2 requirement resolved as pt became more alert.  Hypotension, POA, resolved --received IVF in the ED.  BP wnl now. --cont home cardizem and metop  Bilateral lower extremity weakness Right knee pain Patient with multiple recent CVA's.  Pt reported having received corticosteroid injections in both knees which caused severe pain in the right knee afterwards.   --Family reported pt unable to bear weight on her legs.  Family reported outpatient ortho confirmed nothing wrong with the knees to cause her inability to walk. --neuro consulted --repeat xray of right femur and right knee on 10/3, no acute finding. PLAN: --PT rec SNF --no further workup suggested by neuro --Start Lyrica and Cymbalta for likely nerve pain  Chronic pain on chronic opioids Anxiety on chronic benzo --cont home oxy and Zanax PRN --Start Lyrica and Cymbalta for likely nerve pain  History of recent ischemic CVA involving the frontal lobe Extension of infarct in the setting of hypotension --recently finished inpatient rehab --repeat MRI yesterday showed "Interval mild extension of the Left MCA middle division infarct diagnosed by MRI on 01/15/2020. PLAN: --avoid hypotension --continue statin --no further workup suggested by neuro  History of atrial fibrillation Rate controlled  PLAN: --continue home cardizem and metop --cont home pradaxa    DVT prophylaxis: WJ:XBJYNWGOn:Pradaxa  Code Status: Full code  Family Communication:  Status is: changed to inpatient Dispo:   The patient is from: home Anticipated d/c is  to: SNF rehab Anticipated d/c date is: whenever bed available  Patient currently is medically stable to d/c.     Subjective and Interval History:  Ate well.  Has not been needing pain meds or benzo.   Objective: Vitals:   02/11/20 2320 02/12/20 0552 02/12/20 0812 02/12/20 1145  BP: 101/81 122/67 126/70 106/76  Pulse: 95 63 71 76    Resp: 16 20 (!) 21   Temp: 97.9 F (36.6 C) (!) 97.5 F (36.4 C) 98.1 F (36.7 C) 97.7 F (36.5 C)  TempSrc: Oral Oral  Oral  SpO2: 96% 93% 94%   Weight:      Height:        Intake/Output Summary (Last 24 hours) at 02/12/2020 1523 Last data filed at 02/12/2020 1345 Gross per 24 hour  Intake 480 ml  Output 300 ml  Net 180 ml   Filed Weights   02/08/20 0021  Weight: 55.4 kg    Examination:   Constitutional: NAD, alert, oriented to person and hospital HEENT: conjunctivae and lids normal, EOMI CV: RRR tachycardic. Distal pulses +2.  No cyanosis.   RESP: CTA B/L, normal respiratory effort  GI: +BS, NTND Extremities: No effusions, edema in BLE SKIN: warm, dry and intact Neuro: II - XII grossly intact.  Sensation intact   Data Reviewed: I have personally reviewed following labs and imaging studies  CBC: Recent Labs  Lab 02/08/20 0451 02/09/20 0406 02/10/20 0645 02/11/20 0421 02/12/20 0605  WBC 12.5* 11.7* 12.5* 10.6* 12.8*  HGB 12.0 11.9* 14.4 14.6 14.6  HCT 36.7 36.6 42.0 43.3 43.0  MCV 75.7* 76.7* 74.6* 73.4* 74.3*  PLT 206 235 256 268 302   Basic Metabolic Panel: Recent Labs  Lab 02/08/20 0451 02/09/20 0406 02/10/20 0517 02/11/20 0421 02/12/20 0605  NA 140 138 135 138 139  K 3.9 3.8 4.1 4.3 4.1  CL 108 104 99 103 106  CO2 25 25 25 26 24   GLUCOSE 138* 102* 102* 94 107*  BUN 21 27* 26* 36* 41*  CREATININE 0.53 0.83 0.67 0.84 0.86  CALCIUM 9.0 8.9 9.6 8.9 9.0  MG  --  2.2 2.2 2.5* 2.3   GFR: Estimated Creatinine Clearance: 57.8 mL/min (by C-G formula based on SCr of 0.86 mg/dL). Liver Function Tests: No results for input(s): AST, ALT, ALKPHOS, BILITOT, PROT, ALBUMIN in the last 168 hours. No results for input(s): LIPASE, AMYLASE in the last 168 hours. No results for input(s): AMMONIA in the last 168 hours. Coagulation Profile: No results for input(s): INR, PROTIME in the last 168 hours. Cardiac Enzymes: No results for input(s): CKTOTAL, CKMB,  CKMBINDEX, TROPONINI in the last 168 hours. BNP (last 3 results) No results for input(s): PROBNP in the last 8760 hours. HbA1C: No results for input(s): HGBA1C in the last 72 hours. CBG: No results for input(s): GLUCAP in the last 168 hours. Lipid Profile: No results for input(s): CHOL, HDL, LDLCALC, TRIG, CHOLHDL, LDLDIRECT in the last 72 hours. Thyroid Function Tests: No results for input(s): TSH, T4TOTAL, FREET4, T3FREE, THYROIDAB in the last 72 hours. Anemia Panel: No results for input(s): VITAMINB12, FOLATE, FERRITIN, TIBC, IRON, RETICCTPCT in the last 72 hours. Sepsis Labs: No results for input(s): PROCALCITON, LATICACIDVEN in the last 168 hours.  Recent Results (from the past 240 hour(s))  Respiratory Panel by RT PCR (Flu A&B, Covid) - Nasopharyngeal Swab     Status: None   Collection Time: 02/07/20  8:15 AM   Specimen: Nasopharyngeal Swab  Result Value Ref Range Status  SARS Coronavirus 2 by RT PCR NEGATIVE NEGATIVE Final    Comment: (NOTE) SARS-CoV-2 target nucleic acids are NOT DETECTED.  The SARS-CoV-2 RNA is generally detectable in upper respiratoy specimens during the acute phase of infection. The lowest concentration of SARS-CoV-2 viral copies this assay can detect is 131 copies/mL. A negative result does not preclude SARS-Cov-2 infection and should not be used as the sole basis for treatment or other patient management decisions. A negative result may occur with  improper specimen collection/handling, submission of specimen other than nasopharyngeal swab, presence of viral mutation(s) within the areas targeted by this assay, and inadequate number of viral copies (<131 copies/mL). A negative result must be combined with clinical observations, patient history, and epidemiological information. The expected result is Negative.  Fact Sheet for Patients:  https://www.moore.com/  Fact Sheet for Healthcare Providers:    https://www.young.biz/  This test is no t yet approved or cleared by the [place] FDA and  has been authorized for detection and/or diagnosis of SARS-CoV-2 by FDA under an Emergency Use Authorization (EUA). This EUA will remain  in effect (meaning this test can be used) for the duration of the COVID-19 declaration under Section 564(b)(1) of the Act, 21 U.S.C. section 360bbb-3(b)(1), unless the authorization is terminated or revoked sooner.     Influenza A by PCR NEGATIVE NEGATIVE Final   Influenza B by PCR NEGATIVE NEGATIVE Final    Comment: (NOTE) The Xpert Xpress SARS-CoV-2/FLU/RSV assay is intended as an aid in  the diagnosis of influenza from Nasopharyngeal swab specimens and  should not be used as a sole basis for treatment. Nasal washings and  aspirates are unacceptable for Xpert Xpress SARS-CoV-2/FLU/RSV  testing.  Fact Sheet for Patients: https://www.moore.com/  Fact Sheet for Healthcare Providers: https://www.young.biz/  This test is not yet approved or cleared by the [place] FDA and  has been authorized for detection and/or diagnosis of SARS-CoV-2 by  FDA under an Emergency Use Authorization (EUA). This EUA will remain  in effect (meaning this test can be used) for the duration of the  Covid-19 declaration under Section 564(b)(1) of the Act, 21  U.S.C. section 360bbb-3(b)(1), unless the authorization is  terminated or revoked. Performed at [facility], [address] [place], [place] [address]       Radiology Studies: DG Knee 1-2 Views Right  Result Date: 02/11/2020 CLINICAL DATA:  Pt states she fell 3 wks ago. Increased weakness x 1 wk with increasing pain in right leg. Pt also had a recent CVA. Pt points to mid-distal right femur as area of pain. EXAM: RIGHT KNEE - 1-2 VIEW COMPARISON:  None. FINDINGS: No evidence of fracture, dislocation, or joint effusion. Mild-to-moderate  tricompartmental joint space loss and spurring. Soft tissues are unremarkable. IMPRESSION: No acute osseous abnormality in the right knee. Electronically Signed   By: Emmaline Kluver M.D.   On: 02/11/2020 14:05   MR BRAIN WO CONTRAST  Result Date: 02/10/2020 CLINICAL DATA:  64 year old female with altered mental status, history of atrial fibrillation and stroke. Left hemisphere infarct last month. EXAM: MRI HEAD WITHOUT CONTRAST TECHNIQUE: Multiplanar, multiecho pulse sequences of the brain and surrounding structures were obtained without intravenous contrast. COMPARISON:  Head CT 02/07/2020.  Brain MRI 01/15/2020. FINDINGS: Brain: Expected evolution of the left insula and adjacent operculum cortical diffusion abnormality seen on 01/15/2020. Subacute appearing abnormal diffusion (isointense to facilitated on ADC) now tracking cephalad from that area into the left corona radiata and centrum semiovale (series 2, image 91  today). Also questionable new linear restricted diffusion in the previously infarcted left anterior corona radiata and basal ganglia on series 2, image 85. No hemorrhage or mass effect associated with the above findings. No other new diffusion abnormality identified. Underlying extensive bilateral chronic cortical infarcts and encephalomalacia involving the bilateral MCA, and left PCA territories. Superimposed chronic small vessel ischemia in the left basal ganglia, left thalamus, bilateral cerebellar artery territories. Mild if any chronic cerebral blood products. No midline shift, mass effect, evidence of mass lesion, ventriculomegaly, extra-axial collection or acute intracranial hemorrhage. Cervicomedullary junction and pituitary are within normal limits. Vascular: Major intracranial vascular flow voids are stable since last month. Skull and upper cervical spine: Partially visible prior cervical ACDF. Bone marrow signal is stable and within normal limits. Sinuses/Orbits: Stable, negative.  Other: Mastoids remain clear.  Negative visible scalp and face. IMPRESSION: 1. Interval mild extension of the Left MCA middle division infarct diagnosed by MRI on 01/15/2020. No associated hemorrhage or mass effect. 2. Suggestion of new acute on chronic small vessel ischemia in the nearby left basal ganglia. No associated hemorrhage or mass effect. 3. Otherwise stable extensive chronic ischemic disease. Electronically Signed   By: Odessa Fleming M.D.   On: 02/10/2020 19:20   DG FEMUR, MIN 2 VIEWS RIGHT  Result Date: 02/11/2020 CLINICAL DATA:  Pt states she fell 3 wks ago. Increased weakness x 1 wk with increasing pain in right leg. Pt also had a recent CVA. Pt points to mid-distal right femur as area of pain. EXAM: RIGHT FEMUR 2 VIEWS COMPARISON:  None. FINDINGS: There is no evidence of fracture or other focal bone lesions. Mild degenerative changes at the right hip. Several surgical clips are noted in the right inguinal region. IMPRESSION: No acute osseous abnormality in the right hip/femur. Electronically Signed   By: Emmaline Kluver M.D.   On: 02/11/2020 14:06     Scheduled Meds: . atorvastatin  40 mg Oral Daily  . dabigatran  150 mg Oral Q12H  . diltiazem  300 mg Oral Daily  . DULoxetine  40 mg Oral Daily  . metoprolol succinate  50 mg Oral Q breakfast  . multivitamin with minerals  1 tablet Oral Daily  . nicotine  7 mg Transdermal Daily  . polyethylene glycol  17 g Oral BID  . pregabalin  50 mg Oral BID  . sodium chloride flush  3 mL Intravenous Q12H   Continuous Infusions:   LOS: 3 days     Darlin Priestly, MD [facility] If 7PM-7AM, please contact night-coverage 02/12/2020, 3:23 PM

## 2020-02-12 NOTE — Progress Notes (Signed)
Physical Therapy Treatment Patient Details Name: Julie Harmon MRN: [id number] DOB: 1955/12/28 Today's Date: 02/12/2020    History of Present Illness Julie Harmon is a 64 y.o. female with medical history significant for chronic pain syndrome, history of CVA, history of A. fib and dyslipidemia who was brought into the ER by EMS for evaluation of lower extremity weakness.  Patient received corticosteroid injections in both knees and since then has been complaining of weakness in her legs. Pt with increasing confusion since arrival.  She was recently discharged from acute rehab following an ischemic CVA of the frontal lobe.     PT Comments    Patient is alert and able to follow single step commands consistently. Patient complains of right leg pain at rest but reports no leg pain with therapeutic exercises with A.AROM with BLE. Patient needs Mod A for bed mobility with increased time required due to right leg pain (mostly around the knee) with bed mobility. Sitting tolerance 2-3 minutes. Unable to progress to getting OOB as patient adamant about return to bed despite distraction. Recommend to continue PT to maximize function and safety to facilitate independence.    Follow Up Recommendations  SNF     Equipment Recommendations  Rolling walker with 5" wheels    Recommendations for Other Services       Precautions / Restrictions Precautions Precautions: Fall Restrictions Weight Bearing Restrictions: No    Mobility  Bed Mobility Overal bed mobility: Needs Assistance Bed Mobility: Supine to Sit;Sit to Supine     Supine to sit: Mod assist Sit to supine: Mod assist   General bed mobility comments: assistance mostly for RLE support. occasional trunk support provided. verbal cues for sequencing and technique   Transfers                 General transfer comment: patient refused standing due to right knee pain in sitting position. patient perseverating on knee pain and unable to be  redirected   Ambulation/Gait                 Stairs             Wheelchair Mobility    Modified Rankin (Stroke Patients Only)       Balance Overall balance assessment: Needs assistance Sitting-balance support: Feet supported Sitting balance-Leahy Scale: Fair                                      Cognition Arousal/Alertness: Awake/alert Behavior During Therapy: Anxious Overall Cognitive Status: Impaired/Different from baseline Area of Impairment: Orientation;Memory                 Orientation Level: Disoriented to;Situation;Time   Memory: Decreased short-term memory;Decreased recall of precautions Following Commands: Follows one step commands with increased time Safety/Judgement: Decreased awareness of safety;Decreased awareness of deficits     General Comments: patient is able to follow single step commands consistently       Exercises General Exercises - Lower Extremity Ankle Circles/Pumps: AROM;Strengthening;Both;10 reps;Supine Heel Slides: AAROM;Strengthening;Both;10 reps;Supine Hip ABduction/ADduction: AAROM;Strengthening;Both;10 reps;Supine Other Exercises Other Exercises: verbal cues for technique. no reported pain in right leg with therapeutic exercises     General Comments        Pertinent Vitals/Pain Pain Assessment: Faces Faces Pain Scale: Hurts even more Pain Location: anterior right knee  Pain Descriptors / Indicators: Discomfort    Home Living  Prior Function            PT Goals (current goals can now be found in the care plan section) Acute Rehab PT Goals Patient Stated Goal: no pain in right leg  PT Goal Formulation: With patient Time For Goal Achievement: 02/26/20 Potential to Achieve Goals: Fair Progress towards PT goals: Progressing toward goals    Frequency    Min 2X/week      PT Plan Current plan remains appropriate    Co-evaluation               AM-PAC PT "6 Clicks" Mobility   Outcome Measure  Help needed turning from your back to your side while in a flat bed without using bedrails?: A Lot Help needed moving from lying on your back to sitting on the side of a flat bed without using bedrails?: A Lot Help needed moving to and from a bed to a chair (including a wheelchair)?: A Lot Help needed standing up from a chair using your arms (e.g., wheelchair or bedside chair)?: A Lot Help needed to walk in hospital room?: A Lot Help needed climbing 3-5 steps with a railing? : Total 6 Click Score: 11    End of Session   Activity Tolerance: Patient limited by pain Patient left: in bed;with call bell/phone within reach;with nursing/sitter in room Nurse Communication: Mobility status PT Visit Diagnosis: Muscle weakness (generalized) (M62.81);Difficulty in walking, not elsewhere classified (R26.2);Pain Pain - Right/Left: Right Pain - part of body: Leg     Time: 3149-7026 PT Time Calculation (min) (ACUTE ONLY): 26 min  Charges:  $Therapeutic Exercise: 8-22 mins $Therapeutic Activity: 8-22 mins                    Donna Bernard, PT, MPT    Ina Homes 02/12/2020, 1:12 PM

## 2020-02-13 DIAGNOSIS — I4819 Other persistent atrial fibrillation: Secondary | ICD-10-CM | POA: Diagnosis not present

## 2020-02-13 LAB — CBC
HCT: 42.9 % (ref 36.0–46.0)
Hemoglobin: 13.9 g/dL (ref 12.0–15.0)
MCH: 24.6 pg — ABNORMAL LOW (ref 26.0–34.0)
MCHC: 32.4 g/dL (ref 30.0–36.0)
MCV: 75.9 fL — ABNORMAL LOW (ref 80.0–100.0)
Platelets: 289 10*3/uL (ref 150–400)
RBC: 5.65 MIL/uL — ABNORMAL HIGH (ref 3.87–5.11)
RDW: 16.4 % — ABNORMAL HIGH (ref 11.5–15.5)
WBC: 11.9 10*3/uL — ABNORMAL HIGH (ref 4.0–10.5)
nRBC: 0 % (ref 0.0–0.2)

## 2020-02-13 LAB — BASIC METABOLIC PANEL
Anion gap: 9 (ref 5–15)
BUN: 38 mg/dL — ABNORMAL HIGH (ref 8–23)
CO2: 26 mmol/L (ref 22–32)
Calcium: 9 mg/dL (ref 8.9–10.3)
Chloride: 106 mmol/L (ref 98–111)
Creatinine, Ser: 0.71 mg/dL (ref 0.44–1.00)
GFR calc Af Amer: >60 mL/min (ref >60)
GFR calc non Af Amer: >60 mL/min (ref >60)
Glucose, Bld: 105 mg/dL — ABNORMAL HIGH (ref 70–99)
Potassium: 3.9 mmol/L (ref 3.5–5.1)
Sodium: 141 mmol/L (ref 135–145)

## 2020-02-13 LAB — MAGNESIUM: Magnesium: 2.3 mg/dL (ref 1.7–2.4)

## 2020-02-13 NOTE — TOC Progression Note (Addendum)
Transition of Care [facility]) - Progression Note    Patient Details  Name: Julie Harmon MRN: [id number] Date of Birth: 10-Apr-1956  [facility]) CM/SW Contact  Margarito Liner, LCSW Phone Number: 02/13/2020, 9:31 AM  Clinical Narrative:  Received call from Vernona Rieger at [facility]. She will call [facility] admissions coordinator. CSW faxed PT notes as requested. Asked MD to enter an OT order.   11:40 am: [facility] admissions coordinator spoke to [place]. They are trying to determine how much insurance will cover. Admissions coordinator is faxing Vernona Rieger information to review. CSW called and updated daughter.  3:50 pm: [facility] admissions coordinator will fax their facility information to Vernona Rieger before he leaves for the day. Once they find out how much they will pay, they will determine if they can make a bed offer or not.  Expected Discharge Plan: Skilled Nursing Facility Barriers to Discharge: Continued Medical Work up  Expected Discharge Plan and Services Expected Discharge Plan: Skilled Nursing Facility                                               Social Determinants of Health (SDOH) Interventions    Readmission Risk Interventions No flowsheet data found.

## 2020-02-13 NOTE — Progress Notes (Signed)
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 female with medical history significant for chronic pain syndrome, history of CVA, history of A. fib and dyslipidemia who was brought into the ER by EMS for evaluation of lower extremity weakness.  Patient received corticosteroid injections in both knees and since then has been complaining of weakness in her legs.  She is oriented to person and place but not to time.  She was recently discharged from acute rehab following an ischemic CVA of the frontal lobe.  While in the emergency room patient was noted to be hypotensive requiring a 1 L fluid bolus and room air pulse oximetry of 78% which improved following oxygen supplementation at 2 L to 95%. Patient was also very lethargic while in the ER and the ER physician has requested observation overnight since patient is on opioids at home for chronic pain and hypoxia as well as hypotension may be related to same. Her labs are within normal limits and urine drug screen is positive for opiates and benzodiazepines. CT scan of the head without contrast showed no acute intracranial process.   # AMS and somnolence likely 2/2 sedation medications --Pt on oxycodone and Xanax at home.  May have overdosed, however, pt adamantly denied.  Pt more alert the next day, however, still very confused.  Family said pt only had 1 dose of oxy and Xanax on the day of presentation. PLAN: --cont home oxy and Zanax PRN --avoid over sedation  # Acute hypoxic respiratory failure likely 2/2 somnolence and hypoventilation, resolved --Pt was somnolent in the ED, which prompted admission for observation.  ED  nurse noted "78% on RA, placed on 2L via [place], sats up to 95% on 2L."  O2 requirement resolved as pt became more alert.  Hypotension, POA, resolved --received IVF in the ED.  BP wnl now. --cont home cardizem and metop  Bilateral lower extremity weakness Right knee and right thigh pain Patient with multiple recent CVA's.  Pt reported having received corticosteroid injections in both knees which caused severe pain in the right knee afterwards.   --Family reported pt unable to bear weight on her legs.  Family reported outpatient ortho confirmed nothing wrong with the knees to cause her inability to walk. --neuro consulted, didn't find neurological reason for pt's inability to walk.   --repeat xray of right femur and right knee on 10/3, no acute finding. --Started Lyrica and Cymbalta for likely nerve pain, which may be preventing pt from putting weight on her right leg. PLAN: --PT rec SNF --no further workup suggested by neuro --continue Lyrica and Cmybalta (new) for possible nerve pain.  Chronic pain on chronic opioids Anxiety on chronic benzo --cont home oxy and Zanax PRN --cont Lyrica and Cymbalta (new) for likely nerve pain  History of recent ischemic CVA involving the frontal lobe Extension of infarct in the setting of hypotension --recently finished inpatient rehab --repeat MRI yesterday showed "Interval mild extension of the Left MCA middle division infarct diagnosed by MRI on 01/15/2020. PLAN: --avoid hypotension --continue statin --no further workup suggested by neuro  History of atrial fibrillation Rate controlled  PLAN: --continue home cardizem  and metop --cont home pradaxa    DVT prophylaxis: JM:EQASTMH  Code Status: Full code  Family Communication:  Status is: inpatient Dispo:   The patient is from: home Anticipated d/c is to: SNF rehab Anticipated d/c date is: whenever bed available  Patient currently is medically stable to d/c.     Subjective and  Interval History:  Pt complained that she was only allowed 1 piece of bacon for lunch.  Still complained of right leg pain.   Objective: Vitals:   02/13/20 0446 02/13/20 0923 02/13/20 1130 02/13/20 1609  BP: 124/77 133/90 106/70 112/72  Pulse: 89 (!) 104 63 80  Resp: 16 18 16 14   Temp: (!) 97.4 F (36.3 C) 98.3 F (36.8 C) (!) 97.4 F (36.3 C) 98.1 F (36.7 C)  TempSrc: Oral Oral Oral Oral  SpO2: 94% 97% 95% 94%  Weight:      Height:        Intake/Output Summary (Last 24 hours) at 02/13/2020 1704 Last data filed at 02/13/2020 1300 Gross per 24 hour  Intake 120 ml  Output 400 ml  Net -280 ml   Filed Weights   02/08/20 0021  Weight: 55.4 kg    Examination:   Constitutional: NAD, alert, oriented to person and place HEENT: conjunctivae and lids normal, EOMI CV: No cyanosis.   RESP: normal respiratory effort, on RA Extremities: No effusions, edema in BLE SKIN: warm, dry and intact Neuro: II - XII grossly intact.   Psych: Normal mood and affect.    Data Reviewed: I have personally reviewed following labs and imaging studies  CBC: Recent Labs  Lab 02/09/20 0406 02/10/20 0645 02/11/20 0421 02/12/20 0605 02/13/20 0512  WBC 11.7* 12.5* 10.6* 12.8* 11.9*  HGB 11.9* 14.4 14.6 14.6 13.9  HCT 36.6 42.0 43.3 43.0 42.9  MCV 76.7* 74.6* 73.4* 74.3* 75.9*  PLT 235 256 268 302 289   Basic Metabolic Panel: Recent Labs  Lab 02/09/20 0406 02/10/20 0517 02/11/20 0421 02/12/20 0605 02/13/20 0512  NA 138 135 138 139 141  K 3.8 4.1 4.3 4.1 3.9  CL 104 99 103 106 106  CO2 25 25 26 24 26   GLUCOSE 102* 102* 94 107* 105*  BUN 27* 26* 36* 41* 38*  CREATININE 0.83 0.67 0.84 0.86 0.71  CALCIUM 8.9 9.6 8.9 9.0 9.0  MG 2.2 2.2 2.5* 2.3 2.3   GFR: Estimated Creatinine Clearance: 62.1 mL/min (by C-G formula based on SCr of 0.71 mg/dL). Liver Function Tests: No results for input(s): AST, ALT, ALKPHOS, BILITOT, PROT, ALBUMIN in the last 168 hours. No results for input(s):  LIPASE, AMYLASE in the last 168 hours. No results for input(s): AMMONIA in the last 168 hours. Coagulation Profile: No results for input(s): INR, PROTIME in the last 168 hours. Cardiac Enzymes: No results for input(s): CKTOTAL, CKMB, CKMBINDEX, TROPONINI in the last 168 hours. BNP (last 3 results) No results for input(s): PROBNP in the last 8760 hours. HbA1C: No results for input(s): HGBA1C in the last 72 hours. CBG: No results for input(s): GLUCAP in the last 168 hours. Lipid Profile: No results for input(s): CHOL, HDL, LDLCALC, TRIG, CHOLHDL, LDLDIRECT in the last 72 hours. Thyroid Function Tests: No results for input(s): TSH, T4TOTAL, FREET4, T3FREE, THYROIDAB in the last 72 hours. Anemia Panel: No results for input(s): VITAMINB12, FOLATE, FERRITIN, TIBC, IRON, RETICCTPCT in the last 72 hours. Sepsis Labs: No results for input(s): PROCALCITON, LATICACIDVEN in the last 168 hours.  Recent Results (from the past 240 hour(s))  Respiratory Panel by RT PCR (Flu A&B, Covid) - Nasopharyngeal Swab     Status: None   Collection Time: 02/07/20  8:15 AM   Specimen: Nasopharyngeal Swab  Result Value Ref Range Status   SARS Coronavirus 2 by RT PCR NEGATIVE NEGATIVE Final    Comment: (NOTE) SARS-CoV-2 target nucleic acids are NOT DETECTED.  The SARS-CoV-2 RNA is generally detectable in upper respiratoy specimens during the acute phase of infection. The lowest concentration of SARS-CoV-2 viral copies this assay can detect is 131 copies/mL. A negative result does not preclude SARS-Cov-2 infection and should not be used as the sole basis for treatment or other patient management decisions. A negative result may occur with  improper specimen collection/handling, submission of specimen other than nasopharyngeal swab, presence of viral mutation(s) within the areas targeted by this assay, and inadequate number of viral copies (<131 copies/mL). A negative result must be combined with  clinical observations, patient history, and epidemiological information. The expected result is Negative.  Fact Sheet for Patients:  https://www.moore.com/  Fact Sheet for Healthcare Providers:  https://www.young.biz/  This test is no t yet approved or cleared by the [place] FDA and  has been authorized for detection and/or diagnosis of SARS-CoV-2 by FDA under an Emergency Use Authorization (EUA). This EUA will remain  in effect (meaning this test can be used) for the duration of the COVID-19 declaration under Section 564(b)(1) of the Act, 21 U.S.C. section 360bbb-3(b)(1), unless the authorization is terminated or revoked sooner.     Influenza A by PCR NEGATIVE NEGATIVE Final   Influenza B by PCR NEGATIVE NEGATIVE Final    Comment: (NOTE) The Xpert Xpress SARS-CoV-2/FLU/RSV assay is intended as an aid in  the diagnosis of influenza from Nasopharyngeal swab specimens and  should not be used as a sole basis for treatment. Nasal washings and  aspirates are unacceptable for Xpert Xpress SARS-CoV-2/FLU/RSV  testing.  Fact Sheet for Patients: https://www.moore.com/  Fact Sheet for Healthcare Providers: https://www.young.biz/  This test is not yet approved or cleared by the [place] FDA and  has been authorized for detection and/or diagnosis of SARS-CoV-2 by  FDA under an Emergency Use Authorization (EUA). This EUA will remain  in effect (meaning this test can be used) for the duration of the  Covid-19 declaration under Section 564(b)(1) of the Act, 21  U.S.C. section 360bbb-3(b)(1), unless the authorization is  terminated or revoked. Performed at [facility], [address], [place], [place] [address]       Radiology Studies: No results found.   Scheduled Meds: . atorvastatin  40 mg Oral Daily  . dabigatran  150 mg Oral Q12H  . diltiazem  300 mg Oral Daily  . DULoxetine   40 mg Oral Daily  . metoprolol succinate  50 mg Oral Q breakfast  . multivitamin with minerals  1 tablet Oral Daily  . nicotine  7 mg Transdermal Daily  . polyethylene glycol  17 g Oral BID  . pregabalin  50 mg Oral BID  . sodium chloride flush  3 mL Intravenous Q12H   Continuous Infusions:   LOS: 4 days     Darlin Priestly, MD [facility] If 7PM-7AM, please contact night-coverage 02/13/2020, 5:04 PM

## 2020-02-13 NOTE — Evaluation (Signed)
Occupational Therapy Evaluation Patient Details Name: Julie Harmon MRN: [id number] DOB: August 15, 1955 Today's Date: 02/13/2020    History of Present Illness Julie Harmon is a 64 y.o. female with medical history significant for chronic pain syndrome, history of CVA, history of A. fib and dyslipidemia who was brought into the ER by EMS for evaluation of lower extremity weakness.  Patient received corticosteroid injections in both knees and since then has been complaining of weakness in her legs. Pt with increasing confusion since arrival.  She was recently discharged from acute rehab following an ischemic CVA of the frontal lobe.    Clinical Impression   Patient presenting with decreased I in self care, balance, functional mobility/transfers, endurance, and safety awareness. Pt with reports of increased pain in R knee limiting pt's ability to participate in functional mobility/transfer. Patient reports being mod I PTA although is poor historian. Pt recently admitted to an inpatient rehab following CVA.Patient currently functioning at min - mod A overall ( LB self care). Patient will benefit from acute OT to increase overall independence in the areas of ADLs, functional mobility, and safety awareness in order to safely discharge to next venue of care.    Follow Up Recommendations  SNF;Supervision/Assistance - 24 hour    Equipment Recommendations  Other (comment) (defer to next venue of care)       Precautions / Restrictions Precautions Precautions: Fall      Mobility Bed Mobility Overal bed mobility: Needs Assistance Bed Mobility: Supine to Sit;Sit to Supine     Supine to sit: Mod assist Sit to supine: Mod assist   General bed mobility comments: pt reports increased pain in R LE with mobility  Transfers       General transfer comment: pt refusal secondary to pain    Balance Overall balance assessment: Needs assistance Sitting-balance support: Feet supported Sitting  balance-Leahy Scale: Fair Sitting balance - Comments: No LOB however pt with posterior lean EOB           ADL either performed or assessed with clinical judgement   ADL Overall ADL's : Needs assistance/impaired Eating/Feeding: Set up;Sitting;Cueing for sequencing   Grooming: Brushing hair;Sitting;Cueing for safety;Cueing for sequencing;Set up            Vision Patient Visual Report: No change from baseline              Pertinent Vitals/Pain Pain Assessment: 0-10 Pain Score: 8  Pain Location: anterior right knee  Pain Descriptors / Indicators: Discomfort Pain Intervention(s): Limited activity within patient's tolerance;Monitored during session;Premedicated before session;Repositioned     Hand Dominance Right   Extremity/Trunk Assessment Upper Extremity Assessment Upper Extremity Assessment: Generalized weakness   Lower Extremity Assessment Lower Extremity Assessment: Defer to PT evaluation   Cervical / Trunk Assessment Cervical / Trunk Assessment: Normal   Communication Communication Communication: No difficulties   Cognition Arousal/Alertness: Awake/alert Behavior During Therapy: Anxious Overall Cognitive Status: Impaired/Different from baseline Area of Impairment: Orientation;Memory      Orientation Level: Disoriented to;Situation;Time   Memory: Decreased short-term memory;Decreased recall of precautions Following Commands: Follows one step commands with increased time;Follows one step commands consistently Safety/Judgement: Decreased awareness of safety;Decreased awareness of deficits Awareness: Emergent Problem Solving: Slow processing;Decreased initiation;Difficulty sequencing;Requires verbal cues;Requires tactile cues                Home Living Family/patient expects to be discharged to:: Private residence Living Arrangements: Other relatives Available Help at Discharge: Family Type of Home: House Home Access: Level entry Entrance  Stairs-Number of Steps: 1 step into home   Home Layout: Two level;Full bath on main level;Able to live on main level with bedroom/bathroom     Bathroom Shower/Tub: Producer, television/film/video: Standard Bathroom Accessibility: Yes How Accessible: Accessible via walker Home Equipment: Environmental consultant - 2 wheels      Lives With: Other (Comment) (sister)    Prior Functioning/Environment Level of Independence: Needs assistance  Gait / Transfers Assistance Needed: pt reports that she was not able to walk              OT Problem List: Decreased strength;Decreased coordination;Decreased cognition;Decreased range of motion;Decreased safety awareness;Impaired balance (sitting and/or standing);Decreased knowledge of use of DME or AE;Impaired UE functional use;Impaired vision/perception      OT Treatment/Interventions: Self-care/ADL training;Therapeutic exercise;Therapeutic activities;Neuromuscular education;Energy conservation;DME and/or AE instruction;Patient/family education;Balance training;Cognitive remediation/compensation    OT Goals(Current goals can be found in the care plan section) Acute Rehab OT Goals Patient Stated Goal: no pain in right leg  OT Goal Formulation: With patient Time For Goal Achievement: 02/27/20 Potential to Achieve Goals: Good ADL Goals Pt Will Perform Grooming: with supervision;standing Pt Will Transfer to Toilet: with supervision;ambulating Pt Will Perform Toileting - Clothing Manipulation and hygiene: with supervision;sit to/from stand  OT Frequency: Min 1X/week   Barriers to D/C: Inaccessible home environment;Decreased caregiver support             AM-PAC OT "6 Clicks" Daily Activity     Outcome Measure Help from another person eating meals?: A Little Help from another person taking care of personal grooming?: A Little Help from another person toileting, which includes using toliet, bedpan, or urinal?: A Little Help from another person bathing  (including washing, rinsing, drying)?: A Little Help from another person to put on and taking off regular upper body clothing?: A Little Help from another person to put on and taking off regular lower body clothing?: A Little 6 Click Score: 18   End of Session Nurse Communication: Mobility status  Activity Tolerance: Patient tolerated treatment well Patient left: in bed;with call bell/phone within reach;with bed alarm set  OT Visit Diagnosis: Other abnormalities of gait and mobility (R26.89);Muscle weakness (generalized) (M62.81);Unsteadiness on feet (R26.81);Pain Pain - Right/Left: Right Pain - part of body: Knee                Time: 3295-1884 OT Time Calculation (min): 20 min Charges:  OT General Charges $OT Visit: 1 Visit OT Evaluation $OT Eval Low Complexity: 1 Low  Jackquline Denmark, MS, OTR/L , CBIS ascom [phone]  02/13/20, 4:36 PM

## 2020-02-14 DIAGNOSIS — I4819 Other persistent atrial fibrillation: Secondary | ICD-10-CM | POA: Diagnosis not present

## 2020-02-14 DIAGNOSIS — G894 Chronic pain syndrome: Secondary | ICD-10-CM

## 2020-02-14 MED ORDER — SALINE SPRAY 0.65 % NA SOLN
1.0000 | NASAL | Status: DC | PRN
  Administered 2020-02-14 – 2020-02-15 (×2): 1 via NASAL
  Filled 2020-02-14 (×2): qty 44

## 2020-02-14 NOTE — TOC Progression Note (Addendum)
Transition of Care [facility]) - Progression Note    Patient Details  Name: Julie Harmon MRN: [id number] Date of Birth: 13-Aug-1955  Transition of [facility]) CM/SW Contact  Margarito Liner, LCSW Phone Number: 02/14/2020, 11:53 AM  Clinical Narrative:  Left voicemail for [facility] admissions coordinator to check on updates.   1:36 pm: [facility] admissions coordinator faxed everything that [employer] [employer] requested and is now waiting for them to review and call him back.  4:19 pm: Feliberto Harts at [facility] who stated that patient's insurance authorization had been approved for [employer]. CSW left voicemail for SNF admissions coordinator to see if they had called him yet to notify and give information on how much they will cover.  Expected Discharge Plan: Skilled Nursing Facility Barriers to Discharge: Continued Medical Work up  Expected Discharge Plan and Services Expected Discharge Plan: Skilled Nursing Facility                                               Social Determinants of Health (SDOH) Interventions    Readmission Risk Interventions No flowsheet data found.

## 2020-02-14 NOTE — Progress Notes (Signed)
PROGRESS NOTE    Julie Harmon   YNW:[id number]  DOB: 12-18-1955  PCP: Tarri FullerMalfi, Nicole M, FNP    DOA: 02/07/2020 LOS: 5   Brief Narrative   Summary from Dr. Fran LowesLai 10/5: "Julie Harmon is a 64 y.o. female with medical history significant for chronic pain syndrome, history of CVA, history of A. fib and dyslipidemia who was brought into the ER by EMS for evaluation of lower extremity weakness.  Patient received corticosteroid injections in both knees and since then has been complaining of weakness in her legs.  She is oriented to person and place but not to time.  She was recently discharged from acute rehab following an ischemic CVA of the frontal lobe.  While in the emergency room patient was noted to be hypotensive requiring a 1 L fluid bolus and room air pulse oximetry of 78% which improved following oxygen supplementation at 2 L to 95%. Patient was also very lethargic while in the ER and the ER physician has requested observation overnight since patient is on opioids at home for chronic pain and hypoxia as well as hypotension may be related to same. Her labs are within normal limits and urine drug screen is positive for opiates and benzodiazepines. 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  May have overdosed, however, pt adamantly denied.  Pt more alert the next day, however, still very confused.  Family said pt only had 1 dose of oxy and Xanax on the day of presentation. PLAN: --cont home oxy and Zanax PRN --avoid over sedation  Acute hypoxic respiratory failure likely 2/2 somnolence and hypoventilation, resolved --Pt was  somnolent in the ED, which prompted admission for observation.  ED nurse noted "78% on RA, placed on 2L via [place], sats up to 95% on 2L."  O2 requirement resolved as pt became more alert.  Hypotension, POA, resolved --received IVF in the ED.  BP wnl now. --cont home cardizem and metop  Bilateral lower extremity weakness Right knee and right thigh pain Patient with multiple recent CVA's.  Pt reported having received corticosteroid injections in both knees which caused severe pain in the right knee afterwards.   --Family reported pt unable to bear weight on her legs.  Family reported outpatient ortho confirmed nothing wrong with the knees to cause her inability to walk. --neuro consulted, didn't find neurological reason for pt's inability to walk.   --repeat xray of right femur and right knee on 10/3, no acute finding. --Started Lyrica and Cymbalta for likely nerve pain, which may be preventing pt from putting weight on her right leg. PLAN: --PT rec SNF --no further workup suggested by neuro --continue Lyrica and Cmybalta (new) for possible nerve pain.  Chronic pain on chronic opioids Anxiety on chronic benzo --cont home oxy and Zanax PRN --cont Lyrica and Cymbalta (new) for likely nerve pain  History of recent ischemic CVA involving the frontal lobe Extension of infarct in the setting of hypotension --recently finished inpatient rehab --repeat MRI yesterday showed "Interval mild extension of the Left MCA middle division infarct diagnosed by MRI on 01/15/2020. PLAN: --avoid hypotension --continue statin --no further  workup suggested by neuro  History of atrial fibrillation - rate controlled.  Continue Cardizem and metoprolol, anticoagulation with Pradaxa for stroke prevention --cont home pradaxa    DVT prophylaxis:  dabigatran (PRADAXA) capsule 150 mg   Diet:  Diet Orders (From admission, onward)    Start     Ordered   02/13/20 1322  Diet regular Room service appropriate?  Yes; Fluid consistency: Thin  Diet effective now       Question Answer Comment  Room service appropriate? Yes   Fluid consistency: Thin      02/13/20 1322            Code Status: Full Code    Subjective 02/14/20    Patient seen at bedside this AM.  She denies acute complaints except for being worried and scared, wondering what is wrong with her legs.  Continues to have right knee pain, points to a very small bruise.     Disposition Plan & Communication   Status is: Inpatient  Remains inpatient appropriate because:SNF placement pending   Dispo: The patient is from: Home              Anticipated d/c is to: SNF              Anticipated d/c date is: 1 day              Patient currently is medically stable to d/c.    Family Communication: none at bedside, will attempt to call    Consults, Procedures, Significant Events   Consultants:   Neurology   Antimicrobials:  Anti-infectives (From admission, onward)   None        Objective   Vitals:   02/13/20 2329 02/14/20 0448 02/14/20 0800 02/14/20 1223  BP: 131/77 108/71 106/68 (!) 121/92  Pulse: 81 89 97 (!) 53  Resp: 16 20 18 16   Temp: 97.7 F (36.5 C) (!) 97.4 F (36.3 C) 98 F (36.7 C) (!) 97.5 F (36.4 C)  TempSrc: Oral Oral Oral Oral  SpO2: 92% 94% 94% 94%  Weight:      Height:        Intake/Output Summary (Last 24 hours) at 02/14/2020 1356 Last data filed at 02/14/2020 1022 Gross per 24 hour  Intake 240 ml  Output 650 ml  Net -410 ml   Filed Weights   02/08/20 0021  Weight: 55.4 kg    Physical Exam:  General exam: awake, alert, no acute distress HEENT: moist mucus membranes, hearing grossly normal  Respiratory system: CTAB, no wheezes, rales or rhonchi, normal respiratory effort. Cardiovascular system: normal S1/S2, RRR, no JVD, murmurs, rubs, gallops, no pedal edema.   Gastrointestinal system: soft, NT, ND Extremities: moves all, no edema, normal tone, no cyanosis Psychiatry: normal  mood, congruent affect  Labs   Data Reviewed: I have personally reviewed following labs and imaging studies  CBC: Recent Labs  Lab 02/09/20 0406 02/10/20 0645 02/11/20 0421 02/12/20 0605 02/13/20 0512  WBC 11.7* 12.5* 10.6* 12.8* 11.9*  HGB 11.9* 14.4 14.6 14.6 13.9  HCT 36.6 42.0 43.3 43.0 42.9  MCV 76.7* 74.6* 73.4* 74.3* 75.9*  PLT 235 256 268 302 289   Basic Metabolic Panel: Recent Labs  Lab 02/09/20 0406 02/10/20 0517 02/11/20 0421 02/12/20 0605 02/13/20 0512  NA 138 135 138 139 141  K 3.8 4.1 4.3 4.1 3.9  CL 104 99 103 106 106  CO2 25 25 26 24 26   GLUCOSE 102* 102* 94 107* 105*  BUN  27* 26* 36* 41* 38*  CREATININE 0.83 0.67 0.84 0.86 0.71  CALCIUM 8.9 9.6 8.9 9.0 9.0  MG 2.2 2.2 2.5* 2.3 2.3   GFR: Estimated Creatinine Clearance: 62.1 mL/min (by C-G formula based on SCr of 0.71 mg/dL). Liver Function Tests: No results for input(s): AST, ALT, ALKPHOS, BILITOT, PROT, ALBUMIN in the last 168 hours. No results for input(s): LIPASE, AMYLASE in the last 168 hours. No results for input(s): AMMONIA in the last 168 hours. Coagulation Profile: No results for input(s): INR, PROTIME in the last 168 hours. Cardiac Enzymes: No results for input(s): CKTOTAL, CKMB, CKMBINDEX, TROPONINI in the last 168 hours. BNP (last 3 results) No results for input(s): PROBNP in the last 8760 hours. HbA1C: No results for input(s): HGBA1C in the last 72 hours. CBG: No results for input(s): GLUCAP in the last 168 hours. Lipid Profile: No results for input(s): CHOL, HDL, LDLCALC, TRIG, CHOLHDL, LDLDIRECT in the last 72 hours. Thyroid Function Tests: No results for input(s): TSH, T4TOTAL, FREET4, T3FREE, THYROIDAB in the last 72 hours. Anemia Panel: No results for input(s): VITAMINB12, FOLATE, FERRITIN, TIBC, IRON, RETICCTPCT in the last 72 hours. Sepsis Labs: No results for input(s): PROCALCITON, LATICACIDVEN in the last 168 hours.  Recent Results (from the past 240 hour(s))    Respiratory Panel by RT PCR (Flu A&B, Covid) - Nasopharyngeal Swab     Status: None   Collection Time: 02/07/20  8:15 AM   Specimen: Nasopharyngeal Swab  Result Value Ref Range Status   SARS Coronavirus 2 by RT PCR NEGATIVE NEGATIVE Final    Comment: (NOTE) SARS-CoV-2 target nucleic acids are NOT DETECTED.  The SARS-CoV-2 RNA is generally detectable in upper respiratoy specimens during the acute phase of infection. The lowest concentration of SARS-CoV-2 viral copies this assay can detect is 131 copies/mL. A negative result does not preclude SARS-Cov-2 infection and should not be used as the sole basis for treatment or other patient management decisions. A negative result may occur with  improper specimen collection/handling, submission of specimen other than nasopharyngeal swab, presence of viral mutation(s) within the areas targeted by this assay, and inadequate number of viral copies (<131 copies/mL). A negative result must be combined with clinical observations, patient history, and epidemiological information. The expected result is Negative.  Fact Sheet for Patients:  https://www.moore.com/  Fact Sheet for Healthcare Providers:  https://www.young.biz/  This test is no t yet approved or cleared by the [place] FDA and  has been authorized for detection and/or diagnosis of SARS-CoV-2 by FDA under an Emergency Use Authorization (EUA). This EUA will remain  in effect (meaning this test can be used) for the duration of the COVID-19 declaration under Section 564(b)(1) of the Act, 21 U.S.C. section 360bbb-3(b)(1), unless the authorization is terminated or revoked sooner.     Influenza A by PCR NEGATIVE NEGATIVE Final   Influenza B by PCR NEGATIVE NEGATIVE Final    Comment: (NOTE) The Xpert Xpress SARS-CoV-2/FLU/RSV assay is intended as an aid in  the diagnosis of influenza from Nasopharyngeal swab specimens and  should not be used as  a sole basis for treatment. Nasal washings and  aspirates are unacceptable for Xpert Xpress SARS-CoV-2/FLU/RSV  testing.  Fact Sheet for Patients: https://www.moore.com/  Fact Sheet for Healthcare Providers: https://www.young.biz/  This test is not yet approved or cleared by the [place] FDA and  has been authorized for detection and/or diagnosis of SARS-CoV-2 by  FDA under an Emergency Use Authorization (EUA). This EUA will remain  in effect (meaning this test can be used) for the duration of the  Covid-19 declaration under Section 564(b)(1) of the Act, 21  U.S.C. section 360bbb-3(b)(1), unless the authorization is  terminated or revoked. Performed at [facility], [address] [place], [place] [address]       Imaging Studies   No results found.   Medications   Scheduled Meds: . atorvastatin  40 mg Oral Daily  . dabigatran  150 mg Oral Q12H  . diltiazem  300 mg Oral Daily  . DULoxetine  40 mg Oral Daily  . metoprolol succinate  50 mg Oral Q breakfast  . multivitamin with minerals  1 tablet Oral Daily  . nicotine  7 mg Transdermal Daily  . polyethylene glycol  17 g Oral BID  . pregabalin  50 mg Oral BID  . sodium chloride flush  3 mL Intravenous Q12H   Continuous Infusions:     LOS: 5 days    Time spent: 30 minutes    Pennie Banter, DO [facility]  02/14/2020, 1:56 PM    If 7PM-7AM, please contact night-coverage. How to contact the [facility] Attending or Consulting provider 7A - 7P or covering provider during after hours 7P -7A, for this patient?    1. Check the care team in [facility] and look for a) attending/consulting [facility] provider listed and b) the [facility] team listed 2. Log into [email or web address] and use 's universal password to access. If you do not have the password, please contact the hospital operator. 3. Locate the [facility] provider you are looking for under [facility] and page to a  number that you can be directly reached. 4. If you still have difficulty reaching the provider, please page the [facility] (Director on Call) for the Hospitalists listed on amion for assistance.

## 2020-02-14 NOTE — Hospital Course (Signed)
Summary from Dr. Fran Lowes 10/5: "Julie Harmon is a 64 y.o. female with medical history significant for chronic pain syndrome, history of CVA, history of A. fib and dyslipidemia who was brought into the ER by EMS for evaluation of lower extremity weakness.  Patient received corticosteroid injections in both knees and since then has been complaining of weakness in her legs.  She is oriented to person and place but not to time.  She was recently discharged from acute rehab following an ischemic CVA of the frontal lobe.  While in the emergency room patient was noted to be hypotensive requiring a 1 L fluid bolus and room air pulse oximetry of 78% which improved following oxygen supplementation at 2 L to 95%. Patient was also very lethargic while in the ER and the ER physician has requested observation overnight since patient is on opioids at home for chronic pain and hypoxia as well as hypotension may be related to same. Her labs are within normal limits and urine drug screen is positive for opiates and benzodiazepines. CT scan of the head without contrast showed no acute intracranial process."

## 2020-02-14 NOTE — Progress Notes (Signed)
Physical Therapy Treatment Patient Details Name: Julie Harmon MRN: [id number] DOB: Sep 11, 1955 Today's Date: 02/14/2020    History of Present Illness Julie Harmon is a 64 y.o. female with medical history significant for chronic pain syndrome, history of CVA, history of A. fib and dyslipidemia who was brought into the ER by EMS for evaluation of lower extremity weakness.  Patient received corticosteroid injections in both knees and since then has been complaining of weakness in her legs. Pt with increasing confusion since arrival.  She was recently discharged from acute rehab following an ischemic CVA of the frontal lobe.     PT Comments    Patient received in supine. Agrees to PT session , initially agrees to therex only. Patient performed all exercises independently with cues. She then agreed to try to get up to chair. Requires mod assist to roll onto side and bring LEs off bed, with this action she reports she cannot continue due to right LE pain. Unable to proceed to sitting on side of bed or attempt to get up to chair. She will continue to benefit from skilled PT while here to improve functional mobility and activity tolerance.      Follow Up Recommendations  SNF     Equipment Recommendations  Rolling walker with 5" wheels    Recommendations for Other Services       Precautions / Restrictions Precautions Precautions: Fall Restrictions Weight Bearing Restrictions: No    Mobility  Bed Mobility Overal bed mobility: Needs Assistance Bed Mobility: Supine to Sit;Sit to Supine;Rolling Rolling: Min assist   Supine to sit: Mod assist Sit to supine: Min assist   General bed mobility comments: Patient requires cues, relaxation techniques, min/mod assist to attempt to get to sitting on the edge of the bed. Upon rolling and attempting to bring legs off side of bed, patient reports too much pain and cannot proceed. returned to supine with min assist. .  Transfers                  General transfer comment: pt refusal secondary to pain  Ambulation/Gait             General Gait Details: Unable due to pain   Stairs             Wheelchair Mobility    Modified Rankin (Stroke Patients Only)       Balance                                            Cognition Arousal/Alertness: Awake/alert Behavior During Therapy: Anxious Overall Cognitive Status: Impaired/Different from baseline Area of Impairment: Orientation;Awareness;Problem solving;Following commands                 Orientation Level: Disoriented to;Situation     Following Commands: Follows one step commands with increased time;Follows one step commands consistently Safety/Judgement: Decreased awareness of safety;Decreased awareness of deficits Awareness: Emergent Problem Solving: Slow processing;Decreased initiation;Difficulty sequencing;Requires verbal cues;Requires tactile cues General Comments: patient is able to follow single step commands consistently       Exercises Other Exercises Other Exercises: Supine BLE exercises: ap, heel slides, hip abd/add, SLR x 10 reps each bilaterally. She reports pain in R knee with heel slides and SLR but is able to complete reps. No physical assistance provided.    General Comments        Pertinent Vitals/Pain  Pain Assessment: Faces Faces Pain Scale: Hurts even more Pain Location: anterior right knee  Pain Descriptors / Indicators: Discomfort;Grimacing;Guarding Pain Intervention(s): Limited activity within patient's tolerance;Monitored during session;Repositioned    Home Living                      Prior Function            PT Goals (current goals can now be found in the care plan section) Acute Rehab PT Goals Patient Stated Goal: no pain in right leg  PT Goal Formulation: With patient Time For Goal Achievement: 02/26/20 Potential to Achieve Goals: Fair Progress towards PT goals: Not progressing  toward goals - comment (continues to be limited by pain and anxiety)    Frequency    Min 2X/week      PT Plan Current plan remains appropriate    Co-evaluation              AM-PAC PT "6 Clicks" Mobility   Outcome Measure  Help needed turning from your back to your side while in a flat bed without using bedrails?: A Lot Help needed moving from lying on your back to sitting on the side of a flat bed without using bedrails?: A Lot Help needed moving to and from a bed to a chair (including a wheelchair)?: Total Help needed standing up from a chair using your arms (e.g., wheelchair or bedside chair)?: Total Help needed to walk in hospital room?: Total Help needed climbing 3-5 steps with a railing? : Total 6 Click Score: 8    End of Session   Activity Tolerance: Patient limited by pain Patient left: in bed;Other (comment) (with OT in room.) Nurse Communication: Mobility status PT Visit Diagnosis: Muscle weakness (generalized) (M62.81);Difficulty in walking, not elsewhere classified (R26.2);Pain Pain - Right/Left: Right Pain - part of body: Knee     Time: 4650-3546 PT Time Calculation (min) (ACUTE ONLY): 17 min  Charges:  $Therapeutic Exercise: 8-22 mins                     Julie Harmon, PT, GCS 02/14/20,11:13 AM

## 2020-02-14 NOTE — Progress Notes (Signed)
Occupational Therapy Treatment Patient Details Name: Julie Harmon MRN: [id number] DOB: 20-Dec-1955 Today's Date: 02/14/2020    History of present illness Julie Harmon is a 64 y.o. female with medical history significant for chronic pain syndrome, history of CVA, history of A. fib and dyslipidemia who was brought into the ER by EMS for evaluation of lower extremity weakness.  Patient received corticosteroid injections in both knees and since then has been complaining of weakness in her legs. Pt with increasing confusion since arrival.  She was recently discharged from acute rehab following an ischemic CVA of the frontal lobe.    OT comments  Pt supine in bed and finishing up session with PT. Pt transitioning easily to OT session. Pt dons B socks from bed level set up A. Pt's linen's also soiled from incontinence and pt rolling from L <> R with min A. Pt doffing soiled briefs and dons clean ones with set up A. Pt very anxious with movement and is fearful of the anticipation of pain. Pt making 6 attempts to roll towards EOB to sit up.On 6th attempt, pt agreeable to allow therapist to provide assistance with mod A needed for trunk and LE. Pt sitting on EOB for several minutes and reports dizziness but that it resolves quickly. Pt then standing with mod lifting assistance from EOB and taking several steps with min HHA into recliner chair. Chair alarm activated and all needs within reach.   Follow Up Recommendations  SNF;Supervision/Assistance - 24 hour    Equipment Recommendations  Other (comment) (none recommended at this time)       Precautions / Restrictions Precautions Precautions: Fall Restrictions Weight Bearing Restrictions: No       Mobility Bed Mobility Overal bed mobility: Needs Assistance Bed Mobility: Supine to Sit;Sit to Supine;Rolling Rolling: Min assist   Supine to sit: Mod assist Sit to supine: Min assist   General bed mobility comments: Pt making over 6 attempts rolling  to EOB and then reporting to much pain to attempt supine >sit. She was able to finally sit up on the last attempt with mod A. Mod multimodal cuing for hand placement and technique.  Transfers Overall transfer level: Needs assistance Equipment used: 1 person hand held assist Transfers: Sit to/from [employer] Sit to Stand: Mod assist Stand pivot transfers: Min assist       General transfer comment: mod A to initially come into standing and min A for several steps hand held to recliner chair    Balance Overall balance assessment: Needs assistance Sitting-balance support: Feet supported Sitting balance-Leahy Scale: Fair Sitting balance - Comments: No LOB however pt with posterior lean EOB   Standing balance support: Bilateral upper extremity supported Standing balance-Leahy Scale: Poor Standing balance comment: very anxious with movement           ADL either performed or assessed with clinical judgement   ADL      General ADL Comments: donned B socks while supine in bed with set up A as well as doffing and donning brief.               Cognition Arousal/Alertness: Awake/alert Behavior During Therapy: Anxious Overall Cognitive Status: Impaired/Different from baseline Area of Impairment: Orientation;Awareness;Problem solving;Following commands        Orientation Level: Disoriented to;Situation;Time     Following Commands: Follows one step commands with increased time;Follows one step commands consistently Safety/Judgement: Decreased awareness of safety;Decreased awareness of deficits Awareness: Emergent Problem Solving: Slow processing;Decreased initiation;Difficulty sequencing;Requires verbal cues;Requires  tactile cues General Comments: patient is able to follow single step commands consistently         Exercises Other Exercises Other Exercises: Supine BLE exercises: ap, heel slides, hip abd/add, SLR x 10 reps each bilaterally. She reports pain in  R knee with heel slides and SLR but is able to complete reps. No physical assistance provided.           Pertinent Vitals/ Pain       Pain Assessment: Faces Faces Pain Scale: Hurts even more Pain Location: anterior right knee  Pain Descriptors / Indicators: Discomfort;Grimacing;Guarding Pain Intervention(s): Limited activity within patient's tolerance;Monitored during session;Repositioned         Frequency  Min 1X/week        Progress Toward Goals  OT Goals(current goals can now be found in the care plan section)  Progress towards OT goals: Progressing toward goals  Acute Rehab OT Goals Patient Stated Goal: no pain in right leg  OT Goal Formulation: With patient Time For Goal Achievement: 02/27/20 Potential to Achieve Goals: Good  Plan Discharge plan remains appropriate;Frequency remains appropriate       AM-PAC OT "6 Clicks" Daily Activity     Outcome Measure   Help from another person eating meals?: A Little Help from another person taking care of personal grooming?: A Little Help from another person toileting, which includes using toliet, bedpan, or urinal?: A Little Help from another person bathing (including washing, rinsing, drying)?: A Little Help from another person to put on and taking off regular upper body clothing?: A Little Help from another person to put on and taking off regular lower body clothing?: A Little 6 Click Score: 18    End of Session    OT Visit Diagnosis: Other abnormalities of gait and mobility (R26.89);Muscle weakness (generalized) (M62.81);Unsteadiness on feet (R26.81);Pain Pain - Right/Left: Right Pain - part of body: Knee   Activity Tolerance Patient tolerated treatment well   Patient Left in chair;with chair alarm set;with call bell/phone within reach   Nurse Communication Mobility status        Time: 5631-4970 OT Time Calculation (min): 38 min  Charges: OT General Charges $OT Visit: 1 Visit OT  Treatments $Therapeutic Activity: 38-52 mins  Jackquline Denmark, MS, OTR/L , CBIS ascom [phone]  02/14/20, 12:58 PM

## 2020-02-14 NOTE — Progress Notes (Signed)
Patient assisted back to bed from chair.  She would lean back when standing. I was unable to get her to lean forward on walker

## 2020-02-14 NOTE — Progress Notes (Signed)
Patient is requesting a nasal spray to help with her nasal congestion. Dr Denton Lank notified and order received for ocean nasal spray

## 2020-02-15 DIAGNOSIS — G894 Chronic pain syndrome: Secondary | ICD-10-CM | POA: Diagnosis not present

## 2020-02-15 LAB — RESPIRATORY PANEL BY RT PCR (FLU A&B, COVID)
Influenza A by PCR: NEGATIVE
Influenza B by PCR: NEGATIVE
SARS Coronavirus 2 by RT PCR: NEGATIVE

## 2020-02-15 MED ORDER — OXYCODONE HCL 10 MG PO TABS
10.0000 mg | ORAL_TABLET | Freq: Three times a day (TID) | ORAL | 0 refills | Status: AC | PRN
Start: 2020-02-15 — End: ?

## 2020-02-15 MED ORDER — DULOXETINE HCL 40 MG PO CPEP: 40.0000 mg | ORAL_CAPSULE | Freq: Every day | ORAL | Status: AC

## 2020-02-15 MED ORDER — ALPRAZOLAM 0.25 MG PO TABS: 0.2500 mg | ORAL_TABLET | Freq: Two times a day (BID) | ORAL | 0 refills | Status: AC | PRN

## 2020-02-15 NOTE — Progress Notes (Signed)
Home meds returned to patients sister, Gavin Pound

## 2020-02-15 NOTE — Care Management Important Message (Signed)
Important Message  Patient Details  Name: Julie Harmon MRN: [id number] Date of Birth: 1955-11-28   Medicare Important Message Given:  Yes     [name] 02/15/2020, 2:04 PM

## 2020-02-15 NOTE — TOC Progression Note (Addendum)
Transition of Care [facility]) - Progression Note    Patient Details  Name: Julie Harmon MRN: [id number] Date of Birth: 1955/12/25  Transition of [facility] CM/SW Contact  Margarito Liner, LCSW Phone Number: 02/15/2020, 9:54 AM  Clinical Narrative:   CSW called to update patient's daughter. Still waiting on official offer from [employer] but they have received insurance approval from [facility]. Daughter said Victorino Dike at [facility] told her they would cover at 100% for 20 days and then she would have to pay $150-$160 per day after that. Daughter stated she can afford to pay that if needed but is hoping patient will discharge before the 20 days is up. Patient will go back to her sister's house in [place] after rehab and then plan is to be back in [place] by November 4th. Patient has not received her COVID vaccines so CSW let daughter know about 14-day quarantine. [facility] admissions coordinator will call CSW back after he has spoken with their business Production designer, theatre[employer]tele[employer].  11:29 am: [facility] is able to accept patient today. MD will order a rapid COVID test. Left voicemail for daughter. Will provide update when she calls back.   11:54 am: Received call back from daughter. Provided update.  Expected Discharge Plan: Skilled Nursing Facility Barriers to Discharge: Continued Medical Work up  Expected Discharge Plan and Services Expected Discharge Plan: Skilled Nursing Facility                                               Social Determinants of Health (SDOH) Interventions    Readmission Risk Interventions No flowsheet data found.

## 2020-02-15 NOTE — Progress Notes (Signed)
Report called to Statistician at [employer]. Waiting on EMS for transport

## 2020-02-15 NOTE — Progress Notes (Signed)
Physical Therapy Treatment Patient Details Name: Julie Harmon MRN: [id number] DOB: Dec 21, 1955 Today's Date: 02/15/2020    History of Present Illness Julie Harmon is a 64 y.o. female with medical history significant for chronic pain syndrome, history of CVA, history of A. fib and dyslipidemia who was brought into the ER by EMS for evaluation of lower extremity weakness.  Patient received corticosteroid injections in both knees and since then has been complaining of weakness in her legs. Pt with increasing confusion since arrival.  She was recently discharged from acute rehab following an ischemic CVA of the frontal lobe.     PT Comments    Patient is agreeable to physical therapy. Patient reports pain decreased after therapy intervention. Patient needs Min A for trunk support to sit up on edge of bed. Good sitting balance with sitting tolerance of approximately 2 minutes. Patient needs distraction and cues for attention to task intermittently. Patient declines standing or attempting further mobility at this time despite pain being decreased after mobility efforts. Patient participated with BLE strengthening therapeutic exercises. Recommend to continue PT to maximize function and safety to facilitate independence.    Follow Up Recommendations  SNF     Equipment Recommendations  Rolling walker with 5" wheels    Recommendations for Other Services       Precautions / Restrictions Precautions Precautions: Fall Restrictions Weight Bearing Restrictions: No    Mobility  Bed Mobility Overal bed mobility: Needs Assistance Bed Mobility: Supine to Sit;Sit to Supine     Supine to sit: Min assist Sit to supine: Supervision   General bed mobility comments: assistance for trunk support to sit upright. patient needs distraction and cues for attention to task as she perseverates on potential right leg pain with movement   Transfers                 General transfer comment: patient  returned herself to supine position after sitting up less than 2 minutes. patient declined OOB activity at this time   Ambulation/Gait                 Stairs             Wheelchair Mobility    Modified Rankin (Stroke Patients Only)       Balance Overall balance assessment: Needs assistance Sitting-balance support: Feet supported Sitting balance-Leahy Scale: Good Sitting balance - Comments: no loss of balance in sitting position. patient tolerated sitting upright for less than 2 minutes and participates more during therapy with distraction                                     Cognition Arousal/Alertness: Awake/alert Behavior During Therapy: Anxious Overall Cognitive Status: Impaired/Different from baseline                         Following Commands: Follows one step commands consistently Safety/Judgement: Decreased awareness of safety            Exercises General Exercises - Lower Extremity Ankle Circles/Pumps: AROM;Strengthening;Both;10 reps;Supine Heel Slides: Strengthening;Both;10 reps;Supine;AAROM Hip ABduction/ADduction: AAROM;Strengthening;Both;10 reps;Supine Straight Leg Raises: AROM;Strengthening;Both;10 reps;Supine Other Exercises Other Exercises: verbal and tactile cues for technique     General Comments        Pertinent Vitals/Pain Pain Score:  (8/10 at rest and 5/10 after mobility ) Pain Location: patient reports 8/10 right thigh pain before mobility and decreasing  to 5/10 after mobility  Pain Descriptors / Indicators: Sore Pain Intervention(s): Monitored during session;Repositioned;Limited activity within patient's tolerance    Home Living                      Prior Function            PT Goals (current goals can now be found in the care plan section) Acute Rehab PT Goals Patient Stated Goal: decreased pain in right leg  PT Goal Formulation: With patient Time For Goal Achievement:  02/26/20 Potential to Achieve Goals: Fair Progress towards PT goals: Progressing toward goals    Frequency    Min 2X/week      PT Plan Current plan remains appropriate    Co-evaluation              AM-PAC PT "6 Clicks" Mobility   Outcome Measure  Help needed turning from your back to your side while in a flat bed without using bedrails?: A Lot Help needed moving from lying on your back to sitting on the side of a flat bed without using bedrails?: A Lot Help needed moving to and from a bed to a chair (including a wheelchair)?: A Lot Help needed standing up from a chair using your arms (e.g., wheelchair or bedside chair)?: A Lot Help needed to walk in hospital room?: Total   6 Click Score: 9    End of Session   Activity Tolerance: Patient limited by pain Patient left: in bed;with call bell/phone within reach;with bed alarm set Nurse Communication:  (white board up to date ) PT Visit Diagnosis: Muscle weakness (generalized) (M62.81);Difficulty in walking, not elsewhere classified (R26.2);Pain Pain - Right/Left: Right Pain - part of body: Leg     Time: 1010-1034 PT Time Calculation (min) (ACUTE ONLY): 24 min  Charges:  $Therapeutic Exercise: 8-22 mins $Therapeutic Activity: 8-22 mins                     Donna Bernard, PT, MPT    Ina Homes 02/15/2020, 12:44 PM

## 2020-02-15 NOTE — Discharge Summary (Signed)
Physician Discharge Summary  Julie Harmon WUJ:[id number] DOB: [birth year] DOA: 02/07/2020  PCP: Tarri Fuller, FNP  Admit date: 02/07/2020 Discharge date: 02/15/2020  Admitted From: home Disposition:  SNF  Recommendations for Outpatient Follow-up:  1. Follow up with PCP in 1-2 weeks 2. Please obtain BMP/CBC in one week 3. 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 Fran Lowes 10/5: "Julie Harmon is a 64 y.o. female with medical history significant for chronic pain syndrome, history of CVA, history of A. fib and dyslipidemia who was brought into the ER by EMS for evaluation of lower extremity weakness.  Patient received corticosteroid injections in both knees and since then has been complaining of weakness in her legs.  She is oriented to person and place but not to time.  She was recently discharged from acute rehab following an ischemic CVA of the frontal lobe.  While in the emergency room patient was noted to be hypotensive requiring a 1 L fluid bolus and room air pulse oximetry of 78% which improved following oxygen supplementation at 2 L to 95%. Patient was also very lethargic while in the ER and the ER physician has requested observation overnight since patient is on opioids at home for chronic pain and hypoxia as well as hypotension may be related to same. Her labs are within normal limits and urine drug screen is positive for opiates and benzodiazepines. CT scan of the head without contrast showed no acute intracranial process."    History of recent ischemic CVA involving the frontal  lobe Extension of infarct in the setting of hypotension Recently finished inpatient rehab from prior stroke.  Repeat MRI this admission showed "Interval mild extension of the Left MCA middle division infarct diagnosed by MRI on 01/15/2020. --avoid hypotension --continue statin, Pradaxa --Neurology was consulted, no further workup recommended, outpatient follow up   AMS and somnolence likely 2/2 sedating medications - resolved Pt on oxycodone and Xanax at home. Concern initially for overdose, however, pt denied and family reported she'd only taken one dose each of both on day of presentation. Pt became more alert the next day, however, still very confused.  --cont home oxy and Zanax PRN --hold these if patient is sleepy/sedated  Acute hypoxic respiratory failure likely 2/2 somnolence and hypoventilation, resolved Pt was somnolent in the ED, which prompted admission for observation. ED nurse noted "78% on RA, placed on 2L via [place], sats up to 95% on 2L." O2 requirement resolved as pt became more alert.  Hypotension, POA, resolved --received IVF in the ED. BP wnl now. --cont home cardizem and metop  Bilateral lower extremity weakness Right kneeand right thigh pain Patient with multiple recent CVA's. Pt reported having received corticosteroid injections in both knees which caused severe pain in the right knee afterwards. Family reported pt unable to bear weight on her legs. Family reported outpatient ortho confirmed nothing wrong with the knees to cause her inability to walk. --Neurology was consulted, didn't find neurological reason for pt's inability to walk, no further eval required. --Repeat xray of right femur and right knee on 10/3, no acute findings. --StartedLyrica  and Cymbalta for likely nerve pain - no significant improvement per patient --PT evaluated, recommends SNF at discharge  --continue Cmybalta (new) for possible nerve pain.  Chronic pain on chronic  opioids Anxiety on chronic benzo --cont home oxy and Xanax PRN --started on Cymbalta(new)for likely nerve pain, will continue on d/c --since no appreciable improvement with Lyrica, will stop on d/c to prevent over-sedation again  History of atrial fibrillation - rate controlled.  Continue Cardizem and metoprolol, anticoagulation with Pradaxa for stroke prevention   Discharge Instructions   Discharge Instructions    Call MD for:   Complete by: As directed    Worsening neurologic symptoms including unilateral weakness or numbness/tingling, difficulty speaking or understand other's speech, difficulty swallowing   Call MD for:  extreme fatigue   Complete by: As directed    Call MD for:  persistant dizziness or light-headedness   Complete by: As directed    Call MD for:  severe uncontrolled pain   Complete by: As directed    Call MD for:  temperature >100.4   Complete by: As directed    Diet - low sodium heart healthy   Complete by: As directed    Increase activity slowly   Complete by: As directed      Allergies as of 02/15/2020      Reactions   Iodine Swelling   Morphine Nausea Only      Medication List    TAKE these medications   acetaminophen 325 MG tablet Commonly known as: TYLENOL Take 2 tablets (650 mg total) by mouth every 4 (four) hours as needed for mild pain or moderate pain (or temp > 37.5 C (99.5 F)).   ALPRAZolam 0.25 MG tablet Commonly known as: XANAX Take 1 tablet (0.25 mg total) by mouth 2 (two) times daily as needed for anxiety.   atorvastatin 40 MG tablet Commonly known as: LIPITOR Take 1 tablet (40 mg total) by mouth daily.   dabigatran 150 MG Caps capsule Commonly known as: PRADAXA Take 1 capsule (150 mg total) by mouth every 12 (twelve) hours.   diltiazem 300 MG 24 hr capsule Commonly known as: CARDIZEM CD Take 1 capsule (300 mg total) by mouth daily.   DULoxetine HCl 40 MG Cpep Take 40 mg by mouth at bedtime.   loperamide 2 MG  capsule Commonly known as: IMODIUM Take 1 capsule (2 mg total) by mouth as needed for diarrhea or loose stools.   metoprolol succinate 50 MG 24 hr tablet Commonly known as: TOPROL-XL Take 1 tablet (50 mg total) by mouth daily. Take with or immediately following a meal.   multivitamin with minerals Tabs tablet Take 1 tablet by mouth daily.   nicotine 14 mg/24hr patch Commonly known as: NICODERM CQ - dosed in mg/24 hours 14 mg patch daily x2 weeks then 7 mg patch daily x3 weeks and stop   Oxycodone HCl 10 MG Tabs Take 1 tablet (10 mg total) by mouth every 8 (eight) hours as needed for severe pain.   polyethylene glycol 17 g packet Commonly known as: MIRALAX / GLYCOLAX Take 17 g by mouth 2 (two) times daily.       Contact information for after-discharge care    Destination    [facility] .   Service: Skilled Nursing Contact information: [address] [facility] [place] [address] [phone]                 Allergies  Allergen Reactions  . Iodine  Swelling  . Morphine Nausea Only    Consultations:  Neurology    Procedures/Studies: DG Knee 1-2 Views Right  Result Date: 02/11/2020 CLINICAL DATA:  Pt states she fell 3 wks ago. Increased weakness x 1 wk with increasing pain in right leg. Pt also had a recent CVA. Pt points to mid-distal right femur as area of pain. EXAM: RIGHT KNEE - 1-2 VIEW COMPARISON:  None. FINDINGS: No evidence of fracture, dislocation, or joint effusion. Mild-to-moderate tricompartmental joint space loss and spurring. Soft tissues are unremarkable. IMPRESSION: No acute osseous abnormality in the right knee. Electronically Signed   By: Emmaline Kluver M.D.   On: 02/11/2020 14:05   CT Head Wo Contrast  Result Date: 02/07/2020 CLINICAL DATA:  Altered mental status, possible stroke EXAM: CT HEAD WITHOUT CONTRAST TECHNIQUE: Contiguous axial images were obtained from the base of the skull through the vertex without  intravenous contrast. COMPARISON:  Recent prior CT scan of the head 01/14/2020; prior brain MRI 01/16/2020 FINDINGS: Brain: No evidence of acute infarction, hemorrhage, hydrocephalus, extra-axial collection or mass lesion/mass effect. Multifocal regions of focal encephalomalacia and decreased attenuation including the right frontal, right parietal, left parietal and left inferior frontal lobes consistent with multiple prior ischemic infarcts. The pattern is unchanged compared to relatively recent prior imaging. Additionally, there is both central and cortical volume loss. Central volume loss resultant ex vacuo dilatation of the lateral ventricles. Periventricular, subcortical and deep white matter hypoattenuation also present consistent with chronic microvascular ischemic white matter disease. Vascular: No hyperdense vessel or unexpected calcification. Skull: Normal. Negative for fracture or focal lesion. Sinuses/Orbits: No acute finding. Other: None. IMPRESSION: 1. No acute intracranial process. 2. Stable pattern of multifocal bilateral remote ischemic infarcts, cortical and central atrophy, ex vacuo dilation of the lateral ventricles and chronic microvascular ischemic white matter disease. Electronically Signed   By: Malachy Moan M.D.   On: 02/07/2020 09:19   MR BRAIN WO CONTRAST  Result Date: 02/10/2020 CLINICAL DATA:  65 year old female with altered mental status, history of atrial fibrillation and stroke. Left hemisphere infarct last month. EXAM: MRI HEAD WITHOUT CONTRAST TECHNIQUE: Multiplanar, multiecho pulse sequences of the brain and surrounding structures were obtained without intravenous contrast. COMPARISON:  Head CT 02/07/2020.  Brain MRI 01/15/2020. FINDINGS: Brain: Expected evolution of the left insula and adjacent operculum cortical diffusion abnormality seen on 01/15/2020. Subacute appearing abnormal diffusion (isointense to facilitated on ADC) now tracking cephalad from that area into the  left corona radiata and centrum semiovale (series 2, image 91 today). Also questionable new linear restricted diffusion in the previously infarcted left anterior corona radiata and basal ganglia on series 2, image 85. No hemorrhage or mass effect associated with the above findings. No other new diffusion abnormality identified. Underlying extensive bilateral chronic cortical infarcts and encephalomalacia involving the bilateral MCA, and left PCA territories. Superimposed chronic small vessel ischemia in the left basal ganglia, left thalamus, bilateral cerebellar artery territories. Mild if any chronic cerebral blood products. No midline shift, mass effect, evidence of mass lesion, ventriculomegaly, extra-axial collection or acute intracranial hemorrhage. Cervicomedullary junction and pituitary are within normal limits. Vascular: Major intracranial vascular flow voids are stable since last month. Skull and upper cervical spine: Partially visible prior cervical ACDF. Bone marrow signal is stable and within normal limits. Sinuses/Orbits: Stable, negative. Other: Mastoids remain clear.  Negative visible scalp and face. IMPRESSION: 1. Interval mild extension of the Left MCA middle division infarct diagnosed by MRI on 01/15/2020. No associated hemorrhage or mass effect.  2. Suggestion of new acute on chronic small vessel ischemia in the nearby left basal ganglia. No associated hemorrhage or mass effect. 3. Otherwise stable extensive chronic ischemic disease. Electronically Signed   By: Odessa Fleming M.D.   On: 02/10/2020 19:20   DG Chest Portable 1 View  Result Date: 02/07/2020 CLINICAL DATA:  Weakness, difficulty ambulating EXAM: PORTABLE CHEST 1 VIEW COMPARISON:  None. FINDINGS: Normal heart size and vascularity. Lungs remain clear. No focal pneumonia, collapse or consolidation. Negative for edema, effusion pneumothorax. Trachea midline. Aorta atherosclerotic. Mild scoliosis of the spine. Calcified breast implants noted.  Additional round left upper quadrant peripherally calcified abnormality noted, of uncertain origin by plain radiography. This also may related to prior breast surgery. No available comparison studies. IMPRESSION: No active chest disease.  See above comment. Aortic Atherosclerosis (ICD10-I70.0). Electronically Signed   By: Judie Petit.  Shick M.D.   On: 02/07/2020 10:56   DG FEMUR, MIN 2 VIEWS RIGHT  Result Date: 02/11/2020 CLINICAL DATA:  Pt states she fell 3 wks ago. Increased weakness x 1 wk with increasing pain in right leg. Pt also had a recent CVA. Pt points to mid-distal right femur as area of pain. EXAM: RIGHT FEMUR 2 VIEWS COMPARISON:  None. FINDINGS: There is no evidence of fracture or other focal bone lesions. Mild degenerative changes at the right hip. Several surgical clips are noted in the right inguinal region. IMPRESSION: No acute osseous abnormality in the right hip/femur. Electronically Signed   By: Emmaline Kluver M.D.   On: 02/11/2020 14:06       Subjective: Patient doing well today.  Still has right leg pain at the knee and proximally, unchanged.  Says her leg pain is more problem than weakness.  Denies acute complaints including any worsening weakness or other neurologic symptoms.     Discharge Exam: Vitals:   02/15/20 0709 02/15/20 1135  BP: 118/81 106/85  Pulse: 70 (!) 105  Resp: 20 16  Temp: 98.2 F (36.8 C) 98 F (36.7 C)  SpO2: 96%    Vitals:   02/14/20 2359 02/15/20 0444 02/15/20 0709 02/15/20 1135  BP: 98/76 115/84 118/81 106/85  Pulse: 85 95 70 (!) 105  Resp: 18 18 20 16   Temp: 98 F (36.7 C) 97.6 F (36.4 C) 98.2 F (36.8 C) 98 F (36.7 C)  TempSrc: Oral Oral Oral Oral  SpO2: 96% 96% 96%   Weight:      Height:        General: Pt is alert, awake, not in acute distress Cardiovascular: RRR, S1/S2 +, no rubs, no gallops Respiratory: CTA bilaterally, no wheezing, no rhonchi Abdominal: Soft, NT, ND, bowel sounds + Extremities: no edema, no  cyanosis    The results of significant diagnostics from this hospitalization (including imaging, microbiology, ancillary and laboratory) are listed below for reference.     Microbiology: Recent Results (from the past 240 hour(s))  Respiratory Panel by RT PCR (Flu A&B, Covid) - Nasopharyngeal Swab     Status: None   Collection Time: 02/07/20  8:15 AM   Specimen: Nasopharyngeal Swab  Result Value Ref Range Status   SARS Coronavirus 2 by RT PCR NEGATIVE NEGATIVE Final    Comment: (NOTE) SARS-CoV-2 target nucleic acids are NOT DETECTED.  The SARS-CoV-2 RNA is generally detectable in upper respiratoy specimens during the acute phase of infection. The lowest concentration of SARS-CoV-2 viral copies this assay can detect is 131 copies/mL. A negative result does not preclude SARS-Cov-2 infection and should not be  used as the sole basis for treatment or other patient management decisions. A negative result may occur with  improper specimen collection/handling, submission of specimen other than nasopharyngeal swab, presence of viral mutation(s) within the areas targeted by this assay, and inadequate number of viral copies (<131 copies/mL). A negative result must be combined with clinical observations, patient history, and epidemiological information. The expected result is Negative.  Fact Sheet for Patients:  https://www.moore.com/https://www.fda.gov/media/142436/download  Fact Sheet for Healthcare Providers:  https://www.young.biz/https://www.fda.gov/media/142435/download  This test is no t yet approved or cleared by the [place] FDA and  has been authorized for detection and/or diagnosis of SARS-CoV-2 by FDA under an Emergency Use Authorization (EUA). This EUA will remain  in effect (meaning this test can be used) for the duration of the COVID-19 declaration under Section 564(b)(1) of the Act, 21 U.S.C. section 360bbb-3(b)(1), unless the authorization is terminated or revoked sooner.     Influenza A by PCR NEGATIVE  NEGATIVE Final   Influenza B by PCR NEGATIVE NEGATIVE Final    Comment: (NOTE) The Xpert Xpress SARS-CoV-2/FLU/RSV assay is intended as an aid in  the diagnosis of influenza from Nasopharyngeal swab specimens and  should not be used as a sole basis for treatment. Nasal washings and  aspirates are unacceptable for Xpert Xpress SARS-CoV-2/FLU/RSV  testing.  Fact Sheet for Patients: https://www.moore.com/https://www.fda.gov/media/142436/download  Fact Sheet for Healthcare Providers: https://www.young.biz/https://www.fda.gov/media/142435/download  This test is not yet approved or cleared by the [place] FDA and  has been authorized for detection and/or diagnosis of SARS-CoV-2 by  FDA under an Emergency Use Authorization (EUA). This EUA will remain  in effect (meaning this test can be used) for the duration of the  Covid-19 declaration under Section 564(b)(1) of the Act, 21  U.S.C. section 360bbb-3(b)(1), unless the authorization is  terminated or revoked. Performed at [facility], [address] [place], [place] [id number]      Labs: BNP (last 3 results) No results for input(s): BNP in the last 8760 hours. Basic Metabolic Panel: Recent Labs  Lab 02/09/20 0406 02/10/20 0517 02/11/20 0421 02/12/20 0605 02/13/20 0512  NA 138 135 138 139 141  K 3.8 4.1 4.3 4.1 3.9  CL 104 99 103 106 106  CO2 25 25 26 24 26   GLUCOSE 102* 102* 94 107* 105*  BUN 27* 26* 36* 41* 38*  CREATININE 0.83 0.67 0.84 0.86 0.71  CALCIUM 8.9 9.6 8.9 9.0 9.0  MG 2.2 2.2 2.5* 2.3 2.3   Liver Function Tests: No results for input(s): AST, ALT, ALKPHOS, BILITOT, PROT, ALBUMIN in the last 168 hours. No results for input(s): LIPASE, AMYLASE in the last 168 hours. No results for input(s): AMMONIA in the last 168 hours. CBC: Recent Labs  Lab 02/09/20 0406 02/10/20 0645 02/11/20 0421 02/12/20 0605 02/13/20 0512  WBC 11.7* 12.5* 10.6* 12.8* 11.9*  HGB 11.9* 14.4 14.6 14.6 13.9  HCT 36.6 42.0 43.3 43.0 42.9  MCV 76.7* 74.6* 73.4*  74.3* 75.9*  PLT 235 256 268 302 289   Cardiac Enzymes: No results for input(s): CKTOTAL, CKMB, CKMBINDEX, TROPONINI in the last 168 hours. BNP: Invalid input(s): POCBNP CBG: No results for input(s): GLUCAP in the last 168 hours. D-Dimer No results for input(s): DDIMER in the last 72 hours. Hgb A1c No results for input(s): HGBA1C in the last 72 hours. Lipid Profile No results for input(s): CHOL, HDL, LDLCALC, TRIG, CHOLHDL, LDLDIRECT in the last 72 hours. Thyroid function studies No results for input(s): TSH, T4TOTAL, T3FREE, THYROIDAB in  the last 72 hours.  Invalid input(s): FREET3 Anemia work up No results for input(s): VITAMINB12, FOLATE, FERRITIN, TIBC, IRON, RETICCTPCT in the last 72 hours. Urinalysis    Component Value Date/Time   COLORURINE YELLOW (A) 02/07/2020 0815   APPEARANCEUR CLEAR (A) 02/07/2020 0815   LABSPEC 1.023 02/07/2020 0815   PHURINE 5.0 02/07/2020 0815   GLUCOSEU NEGATIVE 02/07/2020 0815   HGBUR NEGATIVE 02/07/2020 0815   BILIRUBINUR NEGATIVE 02/07/2020 0815   KETONESUR NEGATIVE 02/07/2020 0815   PROTEINUR NEGATIVE 02/07/2020 0815   NITRITE NEGATIVE 02/07/2020 0815   LEUKOCYTESUR NEGATIVE 02/07/2020 0815   Sepsis Labs Invalid input(s): PROCALCITONIN,  WBC,  LACTICIDVEN Microbiology Recent Results (from the past 240 hour(s))  Respiratory Panel by RT PCR (Flu A&B, Covid) - Nasopharyngeal Swab     Status: None   Collection Time: 02/07/20  8:15 AM   Specimen: Nasopharyngeal Swab  Result Value Ref Range Status   SARS Coronavirus 2 by RT PCR NEGATIVE NEGATIVE Final    Comment: (NOTE) SARS-CoV-2 target nucleic acids are NOT DETECTED.  The SARS-CoV-2 RNA is generally detectable in upper respiratoy specimens during the acute phase of infection. The lowest concentration of SARS-CoV-2 viral copies this assay can detect is 131 copies/mL. A negative result does not preclude SARS-Cov-2 infection and should not be used as the sole basis for treatment  or other patient management decisions. A negative result may occur with  improper specimen collection/handling, submission of specimen other than nasopharyngeal swab, presence of viral mutation(s) within the areas targeted by this assay, and inadequate number of viral copies (<131 copies/mL). A negative result must be combined with clinical observations, patient history, and epidemiological information. The expected result is Negative.  Fact Sheet for Patients:  https://www.moore.com/  Fact Sheet for Healthcare Providers:  https://www.young.biz/  This test is no t yet approved or cleared by the [place] FDA and  has been authorized for detection and/or diagnosis of SARS-CoV-2 by FDA under an Emergency Use Authorization (EUA). This EUA will remain  in effect (meaning this test can be used) for the duration of the COVID-19 declaration under Section 564(b)(1) of the Act, 21 U.S.C. section 360bbb-3(b)(1), unless the authorization is terminated or revoked sooner.     Influenza A by PCR NEGATIVE NEGATIVE Final   Influenza B by PCR NEGATIVE NEGATIVE Final    Comment: (NOTE) The Xpert Xpress SARS-CoV-2/FLU/RSV assay is intended as an aid in  the diagnosis of influenza from Nasopharyngeal swab specimens and  should not be used as a sole basis for treatment. Nasal washings and  aspirates are unacceptable for Xpert Xpress SARS-CoV-2/FLU/RSV  testing.  Fact Sheet for Patients: https://www.moore.com/  Fact Sheet for Healthcare Providers: https://www.young.biz/  This test is not yet approved or cleared by the [place] FDA and  has been authorized for detection and/or diagnosis of SARS-CoV-2 by  FDA under an Emergency Use Authorization (EUA). This EUA will remain  in effect (meaning this test can be used) for the duration of the  Covid-19 declaration under Section 564(b)(1) of the Act, 21  U.S.C.  section 360bbb-3(b)(1), unless the authorization is  terminated or revoked. Performed at [facility], [address] [place], [place] [address]      Time coordinating discharge: Over 30 minutes  SIGNED:   Pennie Banter, DO [facility] 02/15/2020, 12:48 PM   If 7PM-7AM, please contact night-coverage [email or web address]

## 2020-02-15 NOTE — Progress Notes (Signed)
Patient sent out via EMS with belongings °

## 2020-02-15 NOTE — TOC Transition Note (Signed)
Transition of Care [facility]) - CM/SW Discharge Note   Patient Details  Name: Julie Harmon MRN: [id number] Date of Birth: 01-20-1956  [facility] CM/SW Contact:  Margarito Liner, LCSW Phone Number: 02/15/2020, 2:31 PM   Clinical Narrative:  Patient has orders to discharge to [facility] today. RN is calling report now. EMS transport has been set up and patient is third on the list. Daughter is aware. No further concerns. CSW signing off.   Final next level of care: Skilled Nursing Facility Barriers to Discharge: Barriers Resolved   Patient Goals and CMS Choice     Choice offered to / list presented to : Adult Children  Discharge Placement PASRR number recieved: 02/08/20            Patient chooses bed at: Other - please specify in the comment section below: ([facility]) Patient to be transferred to facility by: EMS Name of family member notified: Criselda Starke Patient and family notified of of transfer: 02/15/20  Discharge Plan and Services                                     Social Determinants of Health (SDOH) Interventions     Readmission Risk Interventions No flowsheet data found.

## 2020-03-25 ENCOUNTER — Ambulatory Visit: Payer: Self-pay | Admitting: Family Medicine

## 2020-04-02 ENCOUNTER — Ambulatory Visit: Payer: Medicare (Managed Care) | Admitting: Family Medicine

## 2021-09-08 DEATH — deceased

## 2021-11-16 IMAGING — MR MR HEAD W/O CM
11 series · 40 of 48 positions shown · non-contrast
Comparison: Head CT 01/14/2020

CLINICAL DATA: Expressive aphasia

EXAM:
MRI HEAD WITHOUT CONTRAST
TECHNIQUE: Multiplanar, multiecho pulse sequences of the brain and surrounding
structures were obtained without intravenous contrast.

[Series 5: ax dwi_tracew · axial · 3.0mm · 0.60mm/px · z∈[-14,+137]mm · 5 of 48 slices shown]
[im 1/48]
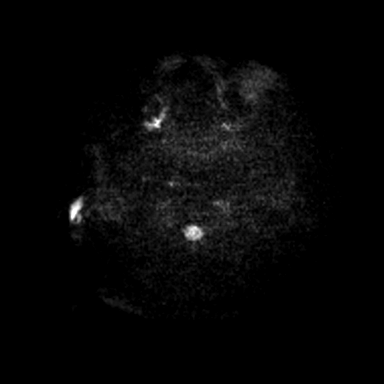
[im 12/48]
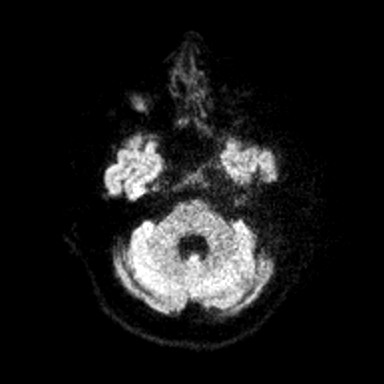
[im 24/48]
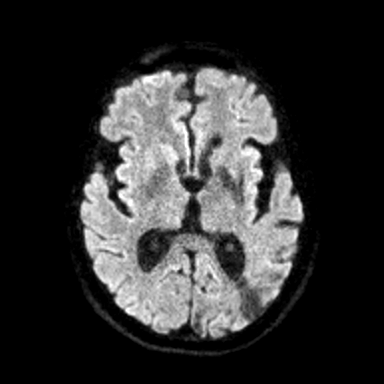
[im 36/48]
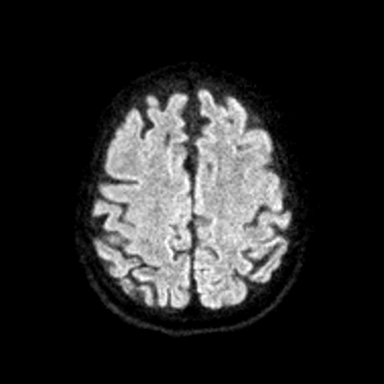
[im 48/48]
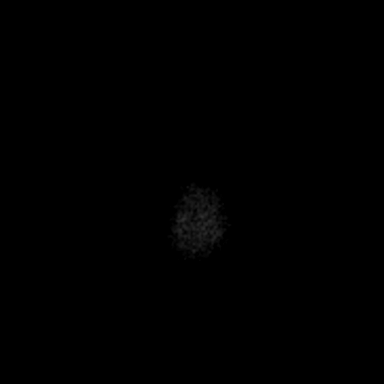

[Series 6: ax dwi_adc · axial · 3.0mm · 0.60mm/px · z∈[-14,+137]mm · 4 of 48 slices shown]
[im 1/48]
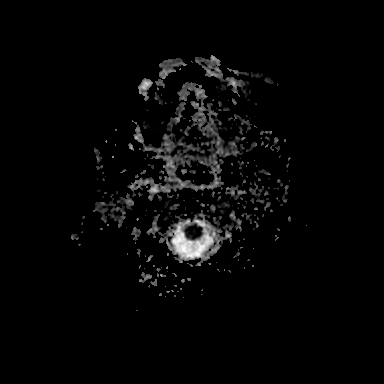
[im 16/48]
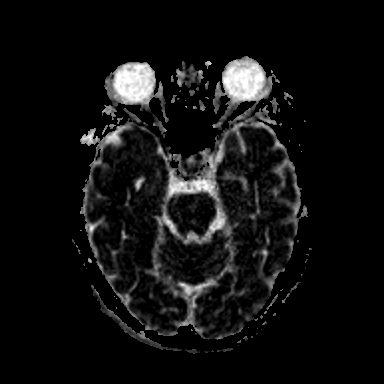
[im 32/48]
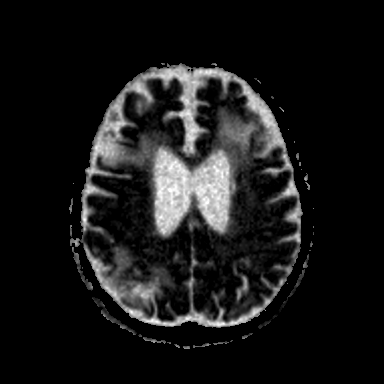
[im 48/48]
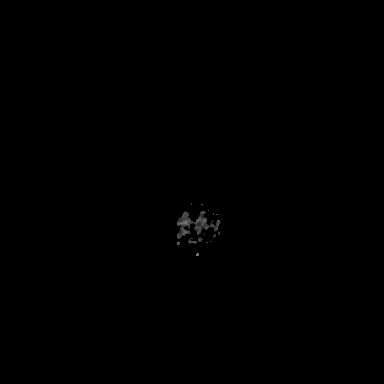

[Series 7: cor dwi_tracew · coronal · 5.0mm · 0.60mm/px · 3 of 38 slices shown]
[im 1/38]
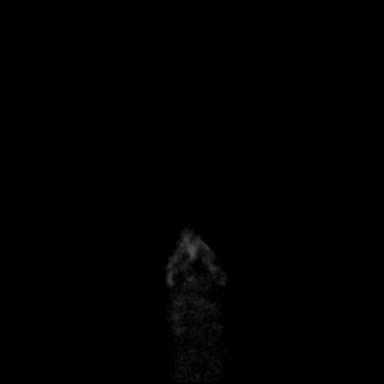
[im 19/38]
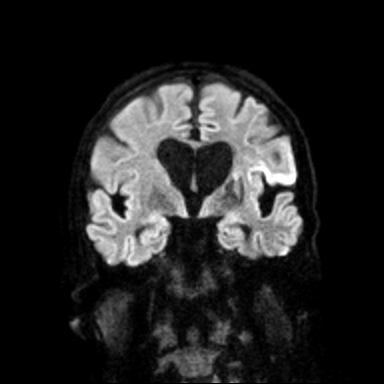
[im 38/38]
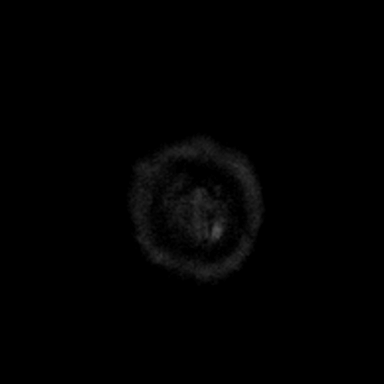

[Series 8: cor dwi_adc · coronal · 5.0mm · 0.60mm/px · 3 of 38 slices shown]
[im 1/38]
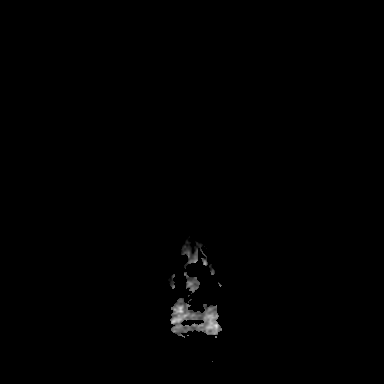
[im 19/38]
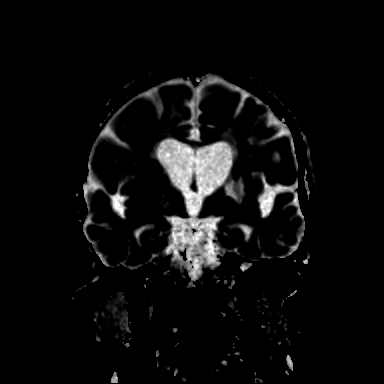
[im 38/38]
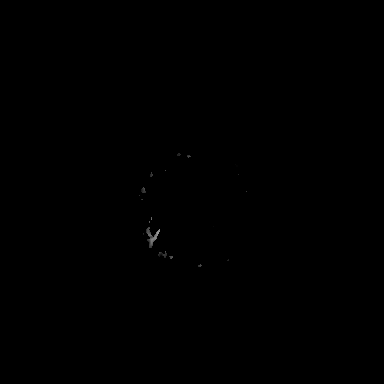

[Series 9: T1 · sagittal · 5.0mm · 0.62mm/px · 2 of 22 slices shown (1 of 2)]
[im 1/22]
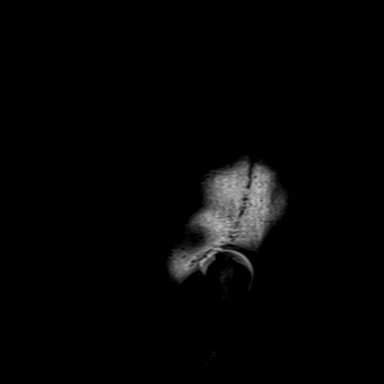
[im 22/22]
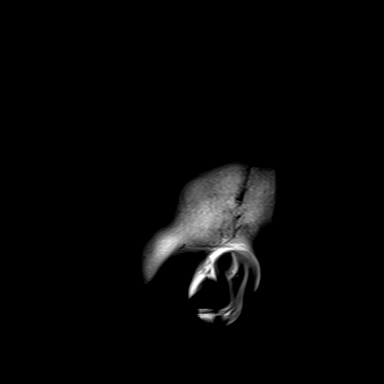

[Series 10: T2 · axial · 5.0mm · 0.53mm/px · z∈[-16,+135]mm · 2 of 27 slices shown (1 of 2)]
[im 1/27]
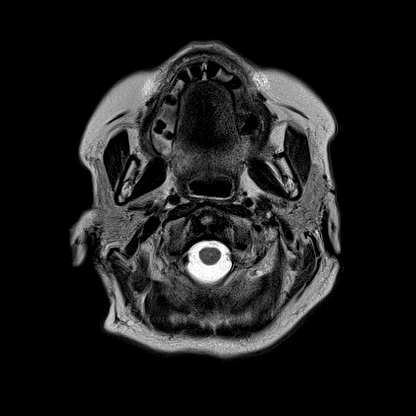
[im 27/27]
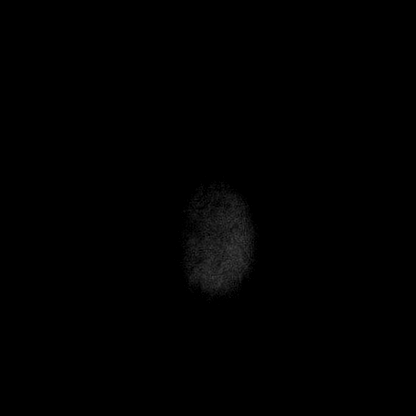

[Series 12: pha_images · axial · 3.0mm · 0.90mm/px · z∈[-21,+130]mm · 4 of 53 slices shown]
[im 1/53]
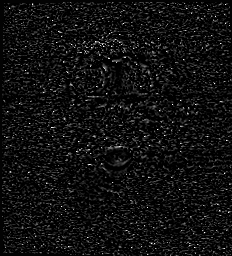
[im 18/53]
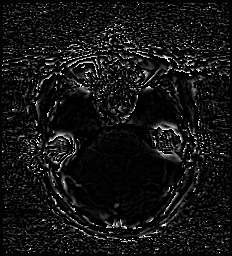
[im 35/53]
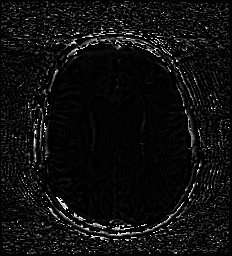
[im 53/53]
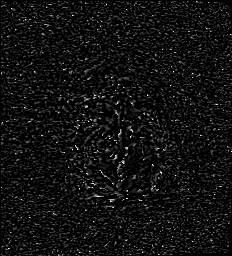

[Series 13: swi_images · axial · 3.0mm · 0.90mm/px · z∈[-21,+17]mm · 2 of 56 slices shown]
[im 1/56]
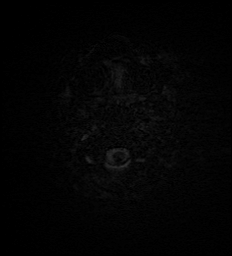
[im 14/56]
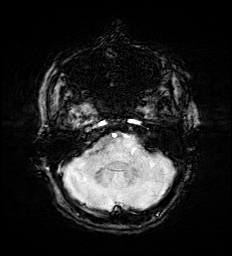

[Series 15: FLAIR · axial · 3.0mm · 0.53mm/px · z∈[-19,+138]mm · 5 of 55 slices shown]
[im 1/55]
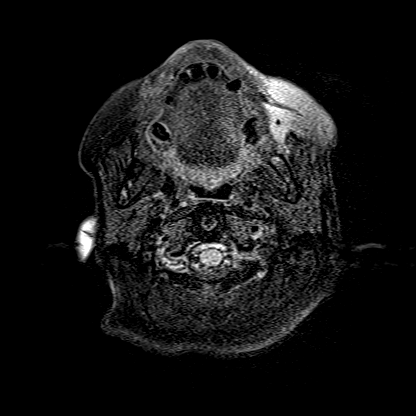
[im 14/55]
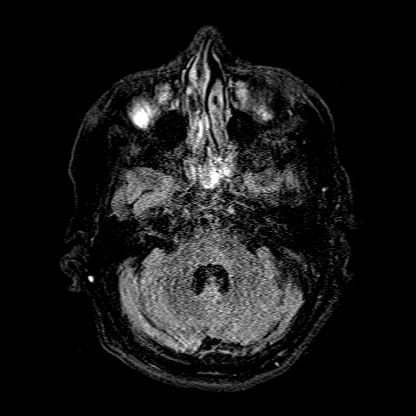
[im 28/55]
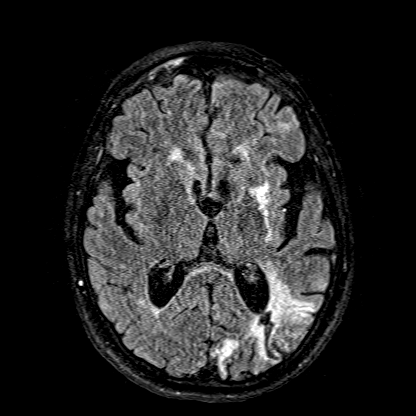
[im 41/55]
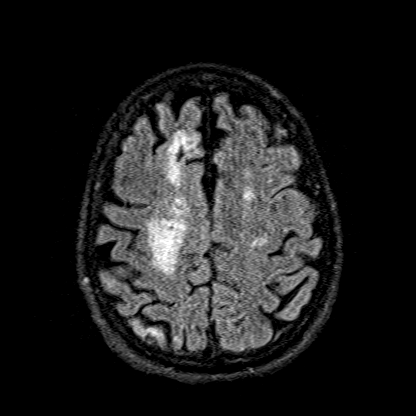
[im 55/55]
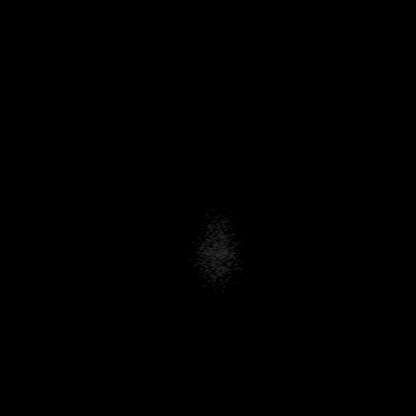

[Series 16: T1 · axial · 1.0mm · 0.98mm/px · z∈[-15,+139]mm · 8 of 160 slices shown (2 of 2)]
[im 1/160]
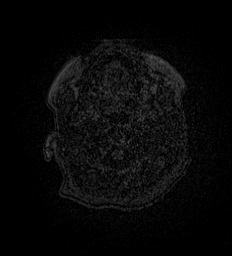
[im 27/160]
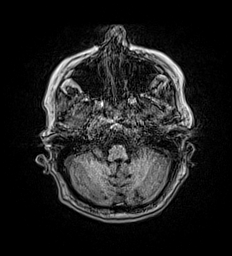
[im 54/160]
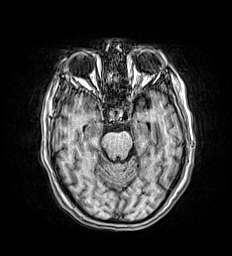
[im 67/160]
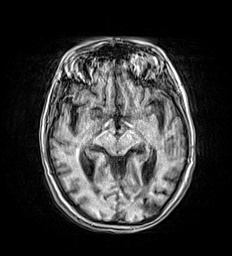
[im 93/160]
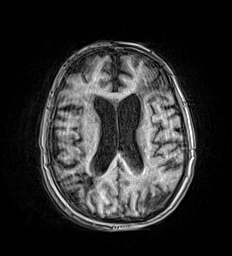
[im 107/160]
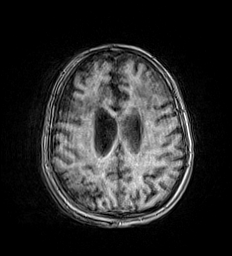
[im 133/160]
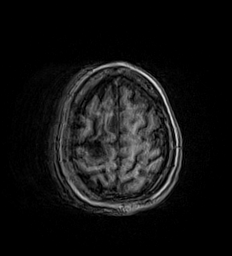
[im 160/160]
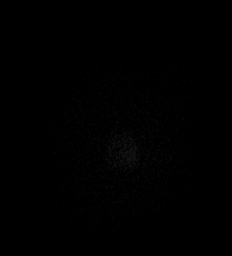

[Series 17: T2 · coronal · 5.0mm · 0.45mm/px · 2 of 29 slices shown (2 of 2)]
[im 1/29]
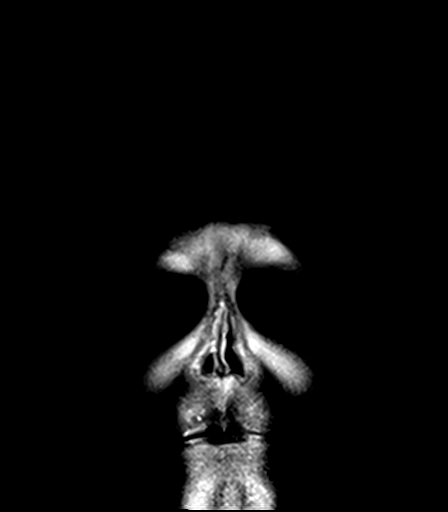
[im 29/29]
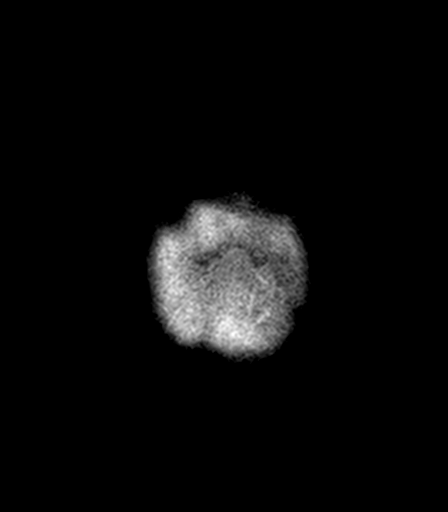

[40 of 48 positions shown; findings below may reference images not displayed]

FINDINGS: Brain: There is an area of faint hyperintensity on
diffusion-weighted imaging within the left frontal operculum and
insula. No other diffusion abnormality. There is not a clear ADC
correlate. There are multiple old infarcts within both hemispheres.
These include both frontal lobes, both parietal lobes, the left
occipital lobe in the left cerebellum. Early confluent hyperintense
T2-weighted signal of the periventricular and deep white matter.
Advanced atrophy for age. No chronic microhemorrhage. Normal midline
structures.

Vascular: Normal flow voids.

Skull and upper cervical spine: Normal marrow signal.

Sinuses/Orbits: Negative.

Other: None.
IMPRESSION: 1. Acute/subacute infarct of the left frontal operculum and insula.
No hemorrhage or mass effect.
2. Advanced atrophy for age with multiple old infarcts.
